# Patient Record
Sex: Female | Born: 1960
Health system: Southern US, Community
[De-identification: ages and names within clinical notes are randomized; demographics above are authoritative.]

## PROBLEM LIST (undated history)

## (undated) DIAGNOSIS — F1010 Alcohol abuse, uncomplicated: Secondary | ICD-10-CM

## (undated) DIAGNOSIS — F329 Major depressive disorder, single episode, unspecified: Secondary | ICD-10-CM

## (undated) DIAGNOSIS — C159 Malignant neoplasm of esophagus, unspecified: Secondary | ICD-10-CM

## (undated) DIAGNOSIS — I5021 Acute systolic (congestive) heart failure: Secondary | ICD-10-CM

## (undated) DIAGNOSIS — J189 Pneumonia, unspecified organism: Secondary | ICD-10-CM

## (undated) DIAGNOSIS — F319 Bipolar disorder, unspecified: Secondary | ICD-10-CM

## (undated) DIAGNOSIS — F32A Depression, unspecified: Secondary | ICD-10-CM

## (undated) DIAGNOSIS — I428 Other cardiomyopathies: Secondary | ICD-10-CM

## (undated) DIAGNOSIS — Z789 Other specified health status: Secondary | ICD-10-CM

## (undated) DIAGNOSIS — B1920 Unspecified viral hepatitis C without hepatic coma: Secondary | ICD-10-CM

## (undated) DIAGNOSIS — I10 Essential (primary) hypertension: Secondary | ICD-10-CM

## (undated) HISTORY — PX: LACERATION REPAIR: SHX5168

## (undated) HISTORY — PX: MULTIPLE TOOTH EXTRACTIONS: SHX2053

---

## 2002-01-16 ENCOUNTER — Encounter: Payer: Self-pay | Admitting: Emergency Medicine

## 2002-01-16 ENCOUNTER — Emergency Department (HOSPITAL_COMMUNITY): Admission: EM | Admit: 2002-01-16 | Discharge: 2002-01-16 | Payer: Self-pay | Admitting: Emergency Medicine

## 2002-03-23 ENCOUNTER — Emergency Department (HOSPITAL_COMMUNITY): Admission: EM | Admit: 2002-03-23 | Discharge: 2002-03-23 | Payer: Self-pay | Admitting: Emergency Medicine

## 2002-03-23 ENCOUNTER — Encounter: Payer: Self-pay | Admitting: Emergency Medicine

## 2007-06-14 ENCOUNTER — Emergency Department (HOSPITAL_COMMUNITY): Admission: EM | Admit: 2007-06-14 | Discharge: 2007-06-14 | Payer: Self-pay | Admitting: Emergency Medicine

## 2009-07-13 ENCOUNTER — Emergency Department (HOSPITAL_COMMUNITY): Admission: EM | Admit: 2009-07-13 | Discharge: 2009-07-13 | Payer: Self-pay | Admitting: Emergency Medicine

## 2009-12-10 ENCOUNTER — Emergency Department (HOSPITAL_COMMUNITY): Admission: EM | Admit: 2009-12-10 | Discharge: 2009-12-10 | Payer: Self-pay | Admitting: Emergency Medicine

## 2010-06-28 LAB — URINALYSIS, MICROSCOPIC ONLY
Bilirubin Urine: NEGATIVE
Glucose, UA: NEGATIVE mg/dL
Ketones, ur: NEGATIVE mg/dL
Nitrite: NEGATIVE
Protein, ur: NEGATIVE mg/dL
Specific Gravity, Urine: 1.002 — ABNORMAL LOW (ref 1.005–1.030)
Urobilinogen, UA: 0.2 mg/dL (ref 0.0–1.0)
pH: 6 (ref 5.0–8.0)

## 2010-06-28 LAB — POCT URINALYSIS DIP (DEVICE)
Bilirubin Urine: NEGATIVE
Glucose, UA: NEGATIVE mg/dL
Ketones, ur: NEGATIVE mg/dL
Nitrite: NEGATIVE
Protein, ur: NEGATIVE mg/dL
Specific Gravity, Urine: <=1.005 (ref 1.005–1.030)
Urobilinogen, UA: 0.2 mg/dL (ref 0.0–1.0)
pH: 6.5 (ref 5.0–8.0)

## 2010-06-28 LAB — URINE CULTURE: Colony Count: 25000

## 2010-08-08 ENCOUNTER — Emergency Department (HOSPITAL_COMMUNITY)
Admission: EM | Admit: 2010-08-08 | Discharge: 2010-08-09 | Disposition: A | Discharge status: Home / Self Care | Payer: Self-pay | Attending: Emergency Medicine | Admitting: Emergency Medicine

## 2010-08-08 ENCOUNTER — Emergency Department (HOSPITAL_COMMUNITY): Payer: Self-pay

## 2010-08-08 DIAGNOSIS — R05 Cough: Secondary | ICD-10-CM | POA: Insufficient documentation

## 2010-08-08 DIAGNOSIS — F329 Major depressive disorder, single episode, unspecified: Secondary | ICD-10-CM | POA: Insufficient documentation

## 2010-08-08 DIAGNOSIS — R252 Cramp and spasm: Secondary | ICD-10-CM | POA: Insufficient documentation

## 2010-08-08 DIAGNOSIS — I10 Essential (primary) hypertension: Secondary | ICD-10-CM | POA: Insufficient documentation

## 2010-08-08 DIAGNOSIS — R059 Cough, unspecified: Secondary | ICD-10-CM | POA: Insufficient documentation

## 2010-08-08 DIAGNOSIS — IMO0001 Reserved for inherently not codable concepts without codable children: Secondary | ICD-10-CM | POA: Insufficient documentation

## 2010-08-08 DIAGNOSIS — J189 Pneumonia, unspecified organism: Secondary | ICD-10-CM | POA: Insufficient documentation

## 2010-08-08 DIAGNOSIS — F3289 Other specified depressive episodes: Secondary | ICD-10-CM | POA: Insufficient documentation

## 2010-08-08 DIAGNOSIS — R071 Chest pain on breathing: Secondary | ICD-10-CM | POA: Insufficient documentation

## 2010-08-08 LAB — BASIC METABOLIC PANEL
GFR calc Af Amer: >60 mL/min (ref >60)
GFR calc non Af Amer: >60 mL/min (ref >60)
Potassium: 3 mEq/L — ABNORMAL LOW (ref 3.5–5.1)
Sodium: 143 mEq/L (ref 135–145)

## 2010-08-08 LAB — POCT CARDIAC MARKERS
CKMB, poc: <1 ng/mL — ABNORMAL LOW (ref 1.0–8.0)
Troponin i, poc: <0.05 ng/mL (ref 0.00–0.09)

## 2010-08-08 LAB — CBC
HCT: 33.8 % — ABNORMAL LOW (ref 36.0–46.0)
MCV: 70.9 fL — ABNORMAL LOW (ref 78.0–100.0)
RBC: 4.77 MIL/uL (ref 3.87–5.11)
WBC: 16.1 10*3/uL — ABNORMAL HIGH (ref 4.0–10.5)

## 2011-04-27 ENCOUNTER — Emergency Department (HOSPITAL_COMMUNITY): Payer: Self-pay

## 2011-04-27 ENCOUNTER — Encounter (HOSPITAL_COMMUNITY): Payer: Self-pay | Admitting: *Deleted

## 2011-04-27 ENCOUNTER — Emergency Department (HOSPITAL_COMMUNITY)
Admission: EM | Admit: 2011-04-27 | Discharge: 2011-04-28 | Disposition: A | Discharge status: Home / Self Care | Payer: Self-pay | Attending: Emergency Medicine | Admitting: Emergency Medicine

## 2011-04-27 DIAGNOSIS — S02609A Fracture of mandible, unspecified, initial encounter for closed fracture: Secondary | ICD-10-CM

## 2011-04-27 DIAGNOSIS — S0003XA Contusion of scalp, initial encounter: Secondary | ICD-10-CM | POA: Insufficient documentation

## 2011-04-27 DIAGNOSIS — M79609 Pain in unspecified limb: Secondary | ICD-10-CM | POA: Insufficient documentation

## 2011-04-27 DIAGNOSIS — I10 Essential (primary) hypertension: Secondary | ICD-10-CM | POA: Insufficient documentation

## 2011-04-27 DIAGNOSIS — R221 Localized swelling, mass and lump, neck: Secondary | ICD-10-CM | POA: Insufficient documentation

## 2011-04-27 DIAGNOSIS — R6884 Jaw pain: Secondary | ICD-10-CM | POA: Insufficient documentation

## 2011-04-27 DIAGNOSIS — S02600A Fracture of unspecified part of body of mandible, initial encounter for closed fracture: Secondary | ICD-10-CM | POA: Insufficient documentation

## 2011-04-27 DIAGNOSIS — Y92009 Unspecified place in unspecified non-institutional (private) residence as the place of occurrence of the external cause: Secondary | ICD-10-CM | POA: Insufficient documentation

## 2011-04-27 DIAGNOSIS — S1093XA Contusion of unspecified part of neck, initial encounter: Secondary | ICD-10-CM | POA: Insufficient documentation

## 2011-04-27 DIAGNOSIS — R220 Localized swelling, mass and lump, head: Secondary | ICD-10-CM | POA: Insufficient documentation

## 2011-04-27 HISTORY — DX: Essential (primary) hypertension: I10

## 2011-04-27 MED ORDER — ACETAMINOPHEN 325 MG PO TABS
650.0000 mg | ORAL_TABLET | Freq: Once | ORAL | Status: AC
  Administered 2011-04-27: 650 mg via ORAL
  Filled 2011-04-27: qty 2
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
## 2011-04-28 MED ORDER — IBUPROFEN 800 MG PO TABS
800.0000 mg | ORAL_TABLET | Freq: Once | ORAL | Status: AC
  Administered 2011-04-28: 800 mg via ORAL
  Filled 2011-04-28: qty 1

## 2011-04-28 MED ORDER — OXYCODONE-ACETAMINOPHEN 5-325 MG PO TABS: 1.0000 | ORAL_TABLET | Freq: Four times a day (QID) | ORAL | Status: AC | PRN

## 2011-04-28 NOTE — ED Notes (Signed)
rx x 1, pt voiced understanding to f/u with ENT. 

## 2011-04-28 NOTE — ED Provider Notes (Signed)
Medical screening examination/treatment/procedure(s) were performed by non-physician practitioner and as supervising physician I was immediately available for consultation/collaboration.   Dione Booze, MD 04/28/11 [phone]

## 2011-05-02 ENCOUNTER — Encounter (HOSPITAL_BASED_OUTPATIENT_CLINIC_OR_DEPARTMENT_OTHER): Payer: Self-pay | Admitting: *Deleted

## 2011-05-07 ENCOUNTER — Ambulatory Visit (HOSPITAL_BASED_OUTPATIENT_CLINIC_OR_DEPARTMENT_OTHER): Admission: RE | Admit: 2011-05-07 | Payer: Self-pay | Source: Ambulatory Visit | Admitting: Plastic Surgery

## 2011-05-07 ENCOUNTER — Encounter (HOSPITAL_BASED_OUTPATIENT_CLINIC_OR_DEPARTMENT_OTHER): Admission: RE | Payer: Self-pay | Source: Ambulatory Visit

## 2011-05-07 HISTORY — DX: Pneumonia, unspecified organism: J18.9

## 2011-05-07 HISTORY — DX: Other specified health status: Z78.9

## 2011-05-07 SURGERY — OPEN REDUCTION INTERNAL FIXATION (ORIF) MANDIBULAR FRACTURE
Anesthesia: General

## 2011-05-15 NOTE — Progress Notes (Signed)
surg was resch All preop instruction reviewed No food-drink am of surgery

## 2011-05-21 ENCOUNTER — Encounter (HOSPITAL_BASED_OUTPATIENT_CLINIC_OR_DEPARTMENT_OTHER): Payer: Self-pay | Admitting: Anesthesiology

## 2011-05-21 ENCOUNTER — Ambulatory Visit (HOSPITAL_BASED_OUTPATIENT_CLINIC_OR_DEPARTMENT_OTHER): Payer: Self-pay | Admitting: Anesthesiology

## 2011-05-21 ENCOUNTER — Ambulatory Visit (HOSPITAL_BASED_OUTPATIENT_CLINIC_OR_DEPARTMENT_OTHER)
Admission: RE | Admit: 2011-05-21 | Discharge: 2011-05-21 | Disposition: A | Discharge status: Home / Self Care | Payer: Self-pay | Source: Ambulatory Visit | Attending: Plastic Surgery | Admitting: Plastic Surgery

## 2011-05-21 ENCOUNTER — Encounter (HOSPITAL_BASED_OUTPATIENT_CLINIC_OR_DEPARTMENT_OTHER): Payer: Self-pay | Admitting: Plastic Surgery

## 2011-05-21 ENCOUNTER — Encounter (HOSPITAL_BASED_OUTPATIENT_CLINIC_OR_DEPARTMENT_OTHER)
Admission: RE | Disposition: A | Discharge status: Home / Self Care | Payer: Self-pay | Source: Ambulatory Visit | Attending: Plastic Surgery

## 2011-05-21 ENCOUNTER — Encounter (HOSPITAL_BASED_OUTPATIENT_CLINIC_OR_DEPARTMENT_OTHER): Payer: Self-pay | Admitting: *Deleted

## 2011-05-21 DIAGNOSIS — Y998 Other external cause status: Secondary | ICD-10-CM | POA: Insufficient documentation

## 2011-05-21 DIAGNOSIS — K08109 Complete loss of teeth, unspecified cause, unspecified class: Secondary | ICD-10-CM | POA: Insufficient documentation

## 2011-05-21 DIAGNOSIS — Y9389 Activity, other specified: Secondary | ICD-10-CM | POA: Insufficient documentation

## 2011-05-21 DIAGNOSIS — S02609A Fracture of mandible, unspecified, initial encounter for closed fracture: Secondary | ICD-10-CM | POA: Diagnosis present

## 2011-05-21 DIAGNOSIS — S02600A Fracture of unspecified part of body of mandible, initial encounter for closed fracture: Secondary | ICD-10-CM | POA: Insufficient documentation

## 2011-05-21 DIAGNOSIS — S02650A Fracture of angle of mandible, unspecified side, initial encounter for closed fracture: Secondary | ICD-10-CM | POA: Insufficient documentation

## 2011-05-21 DIAGNOSIS — Y92009 Unspecified place in unspecified non-institutional (private) residence as the place of occurrence of the external cause: Secondary | ICD-10-CM | POA: Insufficient documentation

## 2011-05-21 HISTORY — PX: ORIF MANDIBULAR FRACTURE: SHX2127

## 2011-05-21 SURGERY — OPEN REDUCTION INTERNAL FIXATION (ORIF) MANDIBULAR FRACTURE
Anesthesia: General | Site: Mouth | Laterality: Bilateral | Wound class: Clean Contaminated

## 2011-05-21 MED ORDER — METOCLOPRAMIDE HCL 5 MG/ML IJ SOLN: 10.0000 mg | Freq: Once | INTRAMUSCULAR | Status: DC | PRN

## 2011-05-21 MED ORDER — CEPHALEXIN 500 MG PO CAPS: 500.0000 mg | ORAL_CAPSULE | Freq: Four times a day (QID) | ORAL | Status: AC

## 2011-05-21 MED ORDER — ACETAMINOPHEN 10 MG/ML IV SOLN
1000.0000 mg | Freq: Once | INTRAVENOUS | Status: AC
  Administered 2011-05-21: 1000 mg via INTRAVENOUS

## 2011-05-21 MED ORDER — MORPHINE SULFATE 2 MG/ML IJ SOLN: 0.0500 mg/kg | INTRAMUSCULAR | Status: DC | PRN

## 2011-05-21 MED ORDER — FENTANYL CITRATE 0.05 MG/ML IJ SOLN
INTRAMUSCULAR | Status: DC | PRN
  Administered 2011-05-21 (×4): 25 ug via INTRAVENOUS
  Administered 2011-05-21 (×2): 50 ug via INTRAVENOUS
  Administered 2011-05-21 (×3): 25 ug via INTRAVENOUS
  Administered 2011-05-21 (×2): 50 ug via INTRAVENOUS
  Administered 2011-05-21: 25 ug via INTRAVENOUS

## 2011-05-21 MED ORDER — LIDOCAINE-EPINEPHRINE 1 %-1:100000 IJ SOLN
INTRAMUSCULAR | Status: DC | PRN
  Administered 2011-05-21: 4 mL

## 2011-05-21 MED ORDER — DROPERIDOL 2.5 MG/ML IJ SOLN
INTRAMUSCULAR | Status: DC | PRN
  Administered 2011-05-21: 0.625 mg via INTRAVENOUS

## 2011-05-21 MED ORDER — DEXAMETHASONE SODIUM PHOSPHATE 4 MG/ML IJ SOLN
INTRAMUSCULAR | Status: DC | PRN
  Administered 2011-05-21: 10 mg via INTRAVENOUS

## 2011-05-21 MED ORDER — ONDANSETRON HCL 4 MG/2ML IJ SOLN
INTRAMUSCULAR | Status: DC | PRN
  Administered 2011-05-21: 4 mg via INTRAVENOUS

## 2011-05-21 MED ORDER — FENTANYL CITRATE 0.05 MG/ML IJ SOLN: 25.0000 ug | INTRAMUSCULAR | Status: DC | PRN

## 2011-05-21 MED ORDER — CEFAZOLIN SODIUM 1-5 GM-% IV SOLN
INTRAVENOUS | Status: DC | PRN
  Administered 2011-05-21: 1 g via INTRAVENOUS

## 2011-05-21 MED ORDER — MIDAZOLAM HCL 2 MG/2ML IJ SOLN: 0.5000 mg | INTRAMUSCULAR | Status: DC | PRN

## 2011-05-21 MED ORDER — LABETALOL HCL 5 MG/ML IV SOLN
INTRAVENOUS | Status: DC | PRN
  Administered 2011-05-21 (×4): 5 mg via INTRAVENOUS

## 2011-05-21 MED ORDER — MIDAZOLAM HCL 5 MG/5ML IJ SOLN
INTRAMUSCULAR | Status: DC | PRN
  Administered 2011-05-21: 2 mg via INTRAVENOUS

## 2011-05-21 MED ORDER — SUCCINYLCHOLINE CHLORIDE 20 MG/ML IJ SOLN
INTRAMUSCULAR | Status: DC | PRN
  Administered 2011-05-21: 100 mg via INTRAVENOUS

## 2011-05-21 MED ORDER — LACTATED RINGERS IV SOLN
INTRAVENOUS | Status: DC
  Administered 2011-05-21 (×4): via INTRAVENOUS

## 2011-05-21 MED ORDER — PROPOFOL 10 MG/ML IV EMUL
INTRAVENOUS | Status: DC | PRN
  Administered 2011-05-21: 200 mg via INTRAVENOUS

## 2011-05-21 MED ORDER — BUPIVACAINE-EPINEPHRINE 0.25% -1:200000 IJ SOLN
INTRAMUSCULAR | Status: DC | PRN
  Administered 2011-05-21: 2 mL

## 2011-05-21 MED ORDER — LIDOCAINE HCL (CARDIAC) 20 MG/ML IV SOLN
INTRAVENOUS | Status: DC | PRN
  Administered 2011-05-21: 40 mg via INTRAVENOUS

## 2011-05-21 SURGICAL SUPPLY — 56 items
BIT DRILL 1.6X115 (BIT) ×1
BIT DRILL 1.6X115MM (BIT) ×1 IMPLANT
CANISTER SUCTION 1200CC (MISCELLANEOUS) ×2 IMPLANT
CLEANER CAUTERY TIP 5X5 PAD (MISCELLANEOUS) IMPLANT
CLOTH BEACON ORANGE TIMEOUT ST (SAFETY) ×2 IMPLANT
CONFORMERS SILICONE 5649 (OPHTHALMIC RELATED) IMPLANT
DECANTER SPIKE VIAL GLASS SM (MISCELLANEOUS) ×2 IMPLANT
DEPRESSOR TONGUE BLADE STERILE (MISCELLANEOUS) IMPLANT
DRILL BIT 1.6X115MM (BIT) ×1
ELECT COATED BLADE 2.86 ST (ELECTRODE) ×2 IMPLANT
ELECT NEEDLE BLADE 2-5/6 (NEEDLE) ×2 IMPLANT
ELECT REM PT RETURN 9FT ADLT (ELECTROSURGICAL) ×2
ELECTRODE REM PT RTRN 9FT ADLT (ELECTROSURGICAL) ×1 IMPLANT
GAUZE PACKING FOLDED 2  STR (GAUZE/BANDAGES/DRESSINGS) ×1
GAUZE PACKING FOLDED 2 STR (GAUZE/BANDAGES/DRESSINGS) ×1 IMPLANT
GAUZE VASELINE FOILPK 1/2 X 72 (GAUZE/BANDAGES/DRESSINGS) IMPLANT
GLOVE BIO SURGEON STRL SZ 6.5 (GLOVE) ×4 IMPLANT
GLOVE BIO SURGEON STRL SZ7 (GLOVE) ×2 IMPLANT
GLOVE BIOGEL PI IND STRL 7.0 (GLOVE) ×2 IMPLANT
GLOVE BIOGEL PI INDICATOR 7.0 (GLOVE) ×2
GLOVE SKINSENSE NS SZ7.0 (GLOVE) ×1
GLOVE SKINSENSE STRL SZ7.0 (GLOVE) ×1 IMPLANT
GOWN PREVENTION PLUS XLARGE (GOWN DISPOSABLE) ×6 IMPLANT
NEEDLE 27GAX1X1/2 (NEEDLE) ×2 IMPLANT
NEEDLE BLUNT 17GA (NEEDLE) ×2 IMPLANT
NS IRRIG 1000ML POUR BTL (IV SOLUTION) ×2 IMPLANT
PACK BASIN DAY SURGERY FS (CUSTOM PROCEDURE TRAY) ×2 IMPLANT
PACK ENT DAY SURGERY (CUSTOM PROCEDURE TRAY) ×2 IMPLANT
PAD CLEANER CAUTERY TIP 5X5 (MISCELLANEOUS)
PATTIES SURGICAL .5 X3 (DISPOSABLE) IMPLANT
PENCIL BUTTON HOLSTER BLD 10FT (ELECTRODE) ×2 IMPLANT
PLATE LOCK 4H W/GAP 1.0 GRD 3 (Plate) ×2 IMPLANT
PLATE LOCK STRT 2 X 7 MATRIX (Plate) ×2 IMPLANT
SCISSORS WIRE ANG 4 3/4 DISP (INSTRUMENTS) IMPLANT
SCREW NON LOCK X-DR 2.0X5 (Screw) ×2 IMPLANT
SCREW NON LOCK X-DR 2.0X6 (Screw) ×8 IMPLANT
SCREW NON LOCK X-DR 2.0X8 (Screw) ×10 IMPLANT
SCREW NON LOCK X-DR 2.3X8 (Screw) ×2 IMPLANT
SLEEVE SCD COMPRESS KNEE MED (MISCELLANEOUS) ×2 IMPLANT
SPONGE GAUZE 2X2 8PLY STRL LF (GAUZE/BANDAGES/DRESSINGS) ×2 IMPLANT
SUCTION FRAZIER TIP 10 FR DISP (SUCTIONS) ×2 IMPLANT
SUT PROLENE 5 0 PS 2 (SUTURE) ×2 IMPLANT
SUT STEEL 0 (SUTURE)
SUT STEEL 0 18XMFL TIE 17 (SUTURE) IMPLANT
SUT STEEL 1 (SUTURE) IMPLANT
SUT STEEL 2 (SUTURE) IMPLANT
SUT VIC AB 3-0 FS2 27 (SUTURE) ×6 IMPLANT
SUT VICRYL 4-0 PS2 18IN ABS (SUTURE) ×2 IMPLANT
SYR BULB 3OZ (MISCELLANEOUS) IMPLANT
SYR CONTROL 10ML LL (SYRINGE) ×2 IMPLANT
TOOTHBRUSH ADULT (PERSONAL CARE ITEMS) ×2 IMPLANT
TOWEL OR 17X24 6PK STRL BLUE (TOWEL DISPOSABLE) ×4 IMPLANT
TRAY DSU PREP LF (CUSTOM PROCEDURE TRAY) ×2 IMPLANT
TUBE SALEM SUMP 16 FR W/ARV (TUBING) ×2 IMPLANT
WATER STERILE IRR 1000ML POUR (IV SOLUTION) IMPLANT
YANKAUER SUCT BULB TIP NO VENT (SUCTIONS) ×2 IMPLANT

## 2011-05-21 NOTE — Anesthesia Preprocedure Evaluation (Signed)
Anesthesia Evaluation  Patient identified by MRN, date of birth, ID band Patient awake    Reviewed: Allergy & Precautions, H&P , NPO status , Patient's Chart, lab work & pertinent test results, reviewed documented beta blocker date and time   Airway Mallampati: II TM Distance: >3 FB Neck ROM: full    Dental   Pulmonary pneumonia ,          Cardiovascular hypertension, On Medications     Neuro/Psych Negative Neurological ROS  Negative Psych ROS   GI/Hepatic negative GI ROS, Neg liver ROS,   Endo/Other  Negative Endocrine ROS  Renal/GU negative Renal ROS  Genitourinary negative   Musculoskeletal   Abdominal   Peds  Hematology negative hematology ROS (+)   Anesthesia Other Findings See surgeon's H&P   Reproductive/Obstetrics negative OB ROS                           Anesthesia Physical Anesthesia Plan  ASA: II  Anesthesia Plan: General   Post-op Pain Management:    Induction: Intravenous  Airway Management Planned: Nasal ETT  Additional Equipment:   Intra-op Plan:   Post-operative Plan: Extubation in OR  Informed Consent: I have reviewed the patients History and Physical, chart, labs and discussed the procedure including the risks, benefits and alternatives for the proposed anesthesia with the patient or authorized representative who has indicated his/her understanding and acceptance.     Plan Discussed with: CRNA and Surgeon  Anesthesia Plan Comments:         Anesthesia Quick Evaluation

## 2011-05-21 NOTE — Transfer of Care (Signed)
Immediate Anesthesia Transfer of Care Note  Patient: Alexandria Buck  Procedure(s) Performed:  OPEN REDUCTION INTERNAL FIXATION (ORIF) MANDIBULAR FRACTURE  Patient Location: PACU  Anesthesia Type: General  Level of Consciousness: awake and sedated  Airway & Oxygen Therapy: Patient Spontanous Breathing and Patient connected to face mask oxygen  Post-op Assessment: Report given to PACU RN and Post -op Vital signs reviewed and stable  Post vital signs: Reviewed and stable  Complications: No apparent anesthesia complications
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
## 2011-05-21 NOTE — Anesthesia Procedure Notes (Signed)
Procedure Name: Intubation Date/Time: 05/21/2011 12:41 PM Performed by: Jearld Shines Pre-anesthesia Checklist: Patient identified, Emergency Drugs available, Suction available and Patient being monitored Patient Re-evaluated:Patient Re-evaluated prior to inductionOxygen Delivery Method: Circle System Utilized Preoxygenation: Pre-oxygenation with 100% oxygen Intubation Type: IV induction Ventilation: Mask ventilation without difficulty Laryngoscope Size: Miller, 2, Mac and 3 Grade View: Grade II Nasal Tubes: Right, Magill forceps - small, utilized and Nasal prep performed Tube size: 6.5 mm Number of attempts: 4 Airway Equipment and Method: video-laryngoscopy Placement Confirmation: ETT inserted through vocal cords under direct vision,  positive ETCO2 and breath sounds checked- equal and bilateral Tube secured with: Tape Dental Injury: Teeth and Oropharynx as per pre-operative assessment and Bloody posterior oropharynx  Difficulty Due To: Difficult Airway- due to large tongue, Difficulty was anticipated and Difficult Airway- due to anterior larynx Future Recommendations: Recommend- induction with short-acting agent, and alternative techniques readily available

## 2011-05-21 NOTE — Anesthesia Postprocedure Evaluation (Signed)
Anesthesia Post Note  Patient: Alexandria Buck  Procedure(s) Performed: Procedure(s) (LRB): OPEN REDUCTION INTERNAL FIXATION (ORIF) MANDIBULAR FRACTURE (Bilateral)  Anesthesia type: General  Patient location: PACU  Post pain: Pain level controlled  Post assessment: Patient's Cardiovascular Status Stable  Last Vitals:  Filed Vitals:   05/21/11 1600  BP: 169/92  Pulse: 62  Temp:   Resp:     Post vital signs: Reviewed and stable  Level of consciousness: alert  Complications: No apparent anesthesia complications

## 2011-05-21 NOTE — Brief Op Note (Signed)
05/21/2011  3:19 PM  PATIENT:  Alexandria Buck  51 y.o. female  PRE-OPERATIVE DIAGNOSIS:  bilateral mandible fracture  POST-OPERATIVE DIAGNOSIS:  bilateral mandible fracture  PROCEDURE:  Procedure(s): OPEN REDUCTION INTERNAL FIXATION (ORIF) MANDIBULAR FRACTURE  SURGEON:  Surgeon(s): [employer], DO  PHYSICIAN ASSISTANT:   ASSISTANTS: none   ANESTHESIA:   local and general  EBL:  Total I/O In: 2000 [I.V.:2000] Out: -   BLOOD ADMINISTERED:none  DRAINS: none   LOCAL MEDICATIONS USED:  MARCAINE     SPECIMEN:  No Specimen  DISPOSITION OF SPECIMEN:  N/A  COUNTS:  YES  TOURNIQUET:  * No tourniquets in log *  DICTATION: dictated  PLAN OF CARE: Discharge to home after PACU  PATIENT DISPOSITION:  PACU - hemodynamically stable.   Delay start of Pharmacological VTE agent (>24hrs) due to surgical blood loss or risk of bleeding: no
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
## 2011-05-28 ENCOUNTER — Encounter (HOSPITAL_BASED_OUTPATIENT_CLINIC_OR_DEPARTMENT_OTHER): Payer: Self-pay | Admitting: Plastic Surgery

## 2013-09-17 ENCOUNTER — Encounter (HOSPITAL_COMMUNITY): Payer: Self-pay | Admitting: Emergency Medicine

## 2013-09-17 ENCOUNTER — Emergency Department (HOSPITAL_COMMUNITY)
Admission: EM | Admit: 2013-09-17 | Discharge: 2013-09-17 | Discharge status: Against Medical Advice | Payer: Self-pay | Attending: Emergency Medicine | Admitting: Emergency Medicine

## 2013-09-17 ENCOUNTER — Emergency Department (HOSPITAL_COMMUNITY): Payer: Self-pay

## 2013-09-17 DIAGNOSIS — I10 Essential (primary) hypertension: Secondary | ICD-10-CM | POA: Insufficient documentation

## 2013-09-17 DIAGNOSIS — R000 Tachycardia, unspecified: Secondary | ICD-10-CM | POA: Insufficient documentation

## 2013-09-17 DIAGNOSIS — R059 Cough, unspecified: Secondary | ICD-10-CM | POA: Insufficient documentation

## 2013-09-17 DIAGNOSIS — R05 Cough: Secondary | ICD-10-CM | POA: Insufficient documentation

## 2013-09-17 DIAGNOSIS — F17200 Nicotine dependence, unspecified, uncomplicated: Secondary | ICD-10-CM | POA: Insufficient documentation

## 2013-09-17 DIAGNOSIS — D696 Thrombocytopenia, unspecified: Secondary | ICD-10-CM | POA: Insufficient documentation

## 2013-09-17 DIAGNOSIS — R079 Chest pain, unspecified: Secondary | ICD-10-CM | POA: Insufficient documentation

## 2013-09-17 DIAGNOSIS — Z8701 Personal history of pneumonia (recurrent): Secondary | ICD-10-CM | POA: Insufficient documentation

## 2013-09-17 DIAGNOSIS — R109 Unspecified abdominal pain: Secondary | ICD-10-CM | POA: Insufficient documentation

## 2013-09-17 DIAGNOSIS — F319 Bipolar disorder, unspecified: Secondary | ICD-10-CM | POA: Insufficient documentation

## 2013-09-17 HISTORY — DX: Major depressive disorder, single episode, unspecified: F32.9

## 2013-09-17 HISTORY — DX: Depression, unspecified: F32.A

## 2013-09-17 HISTORY — DX: Bipolar disorder, unspecified: F31.9

## 2013-09-17 LAB — TROPONIN I: Troponin I: <0.3 ng/mL (ref <0.30)

## 2013-09-17 LAB — LIPASE, BLOOD: Lipase: 16 U/L (ref 11–59)

## 2013-09-17 LAB — CBC
HEMATOCRIT: 41.3 % (ref 36.0–46.0)
Hemoglobin: 14.5 g/dL (ref 12.0–15.0)
MCH: 32.5 pg (ref 26.0–34.0)
MCHC: 35.1 g/dL (ref 30.0–36.0)
MCV: 92.6 fL (ref 78.0–100.0)
Platelets: 138 10*3/uL — ABNORMAL LOW (ref 150–400)
RBC: 4.46 MIL/uL (ref 3.87–5.11)
RDW: 12.4 % (ref 11.5–15.5)
WBC: 10.8 10*3/uL — ABNORMAL HIGH (ref 4.0–10.5)

## 2013-09-17 LAB — BASIC METABOLIC PANEL
BUN: 3 mg/dL — AB (ref 6–23)
CO2: 22 mEq/L (ref 19–32)
CREATININE: 0.56 mg/dL (ref 0.50–1.10)
Calcium: 9.4 mg/dL (ref 8.4–10.5)
Chloride: 97 mEq/L (ref 96–112)
Glucose, Bld: 100 mg/dL — ABNORMAL HIGH (ref 70–99)
POTASSIUM: 3.6 meq/L — AB (ref 3.7–5.3)
Sodium: 137 mEq/L (ref 137–147)

## 2013-09-17 MED ORDER — KETOROLAC TROMETHAMINE 30 MG/ML IJ SOLN: 30.0000 mg | Freq: Once | INTRAMUSCULAR | Status: DC

## 2013-09-17 MED ORDER — POTASSIUM CHLORIDE CRYS ER 20 MEQ PO TBCR: 40.0000 meq | EXTENDED_RELEASE_TABLET | Freq: Once | ORAL | Status: DC
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
## 2013-09-17 NOTE — ED Notes (Signed)
Pt has decided to leave AMA. Dr. Roxanne Mins notified.

## 2013-09-17 NOTE — Discharge Instructions (Signed)
He was leaving [place]. Your evaluation was going to include test to see if you have blood clots in your lungs. Clots in your lungs can be fatal. If you change your mind, please return immediately and we will continue with the evaluation.  Acknowledgement of Risk of Discharge  Against Medical Advice   And Release of Liability    Because I am choosing to leave the hospital in spite of these risks, I release the hospital, its employees and officers, and my attending physician from all liability for any adverse results caused by my leaving the hospital prematurely.   ________________________________      _____________________________________ Patient's Signature                                        Date and Time   ________________________________      _____________________________________ Witness' Signature                                        Relationship to Patient    ________________________________      _____________________________________ Witness' Signature                                        Relationship to Patient   [If patient refuses to sign, write "Patient refused to sign" on the patient's signature line and have witnesses to the refusal sign as witnesses.]    Chest Pain (Nonspecific) It is often hard to give a specific diagnosis for the cause of chest pain. There is always a chance that your pain could be related to something serious, such as a heart attack or a blood clot in the lungs. You need to follow up with your caregiver for further evaluation. CAUSES   Heartburn.  Pneumonia or bronchitis.  Anxiety or stress.  Inflammation around your heart (pericarditis) or lung (pleuritis or pleurisy).  A blood clot in the lung.  A collapsed lung (pneumothorax). It can develop suddenly on its own (spontaneous pneumothorax) or from injury (trauma) to the chest.  Shingles infection (herpes zoster virus). The chest wall is composed of bones, muscles,  and cartilage. Any of these can be the source of the pain.  The bones can be bruised by injury.  The muscles or cartilage can be strained by coughing or overwork.  The cartilage can be affected by inflammation and become sore (costochondritis). DIAGNOSIS  Lab tests or other studies, such as X-rays, electrocardiography, stress testing, or cardiac imaging, may be needed to find the cause of your pain.  TREATMENT   Treatment depends on what may be causing your chest pain. Treatment may include:  Acid blockers for heartburn.  Anti-inflammatory medicine.  Pain medicine for inflammatory conditions.  Antibiotics if an infection is present.  You may be advised to change lifestyle habits. This includes stopping smoking and avoiding alcohol, caffeine, and chocolate.  You may be advised to keep your head raised (elevated) when sleeping. This reduces the chance of acid going backward from your stomach into your esophagus.  Most of the time, nonspecific chest pain will improve within 2 to 3 days with rest and mild pain medicine. HOME CARE INSTRUCTIONS   If antibiotics were prescribed, take your antibiotics  as directed. Finish them even if you start to feel better.  For the next few days, avoid physical activities that bring on chest pain. Continue physical activities as directed.  Do not smoke.  Avoid drinking alcohol.  Only take over-the-counter or prescription medicine for pain, discomfort, or fever as directed by your caregiver.  Follow your caregiver's suggestions for further testing if your chest pain does not go away.  Keep any follow-up appointments you made. If you do not go to an appointment, you could develop lasting (chronic) problems with pain. If there is any problem keeping an appointment, you must call to reschedule. SEEK MEDICAL CARE IF:   You think you are having problems from the medicine you are taking. Read your medicine instructions carefully.  Your chest pain  does not go away, even after treatment.  You develop a rash with blisters on your chest. SEEK IMMEDIATE MEDICAL CARE IF:   You have increased chest pain or pain that spreads to your arm, neck, jaw, back, or abdomen.  You develop shortness of breath, an increasing cough, or you are coughing up blood.  You have severe back or abdominal pain, feel nauseous, or vomit.  You develop severe weakness, fainting, or chills.  You have a fever. THIS IS AN EMERGENCY. Do not wait to see if the pain will go away. Get medical help at once. Call your local emergency services (911 in U.S.). Do not drive yourself to the hospital. MAKE SURE YOU:   Understand these instructions.  Will watch your condition.  Will get help right away if you are not doing well or get worse. Document Released: 01/03/2005 Document Revised: 06/18/2011 Document Reviewed: 10/30/2007 [facility] Patient Information 2014 [place].   Emergency Department Resource Guide 1) Find a Doctor and Pay Out of Pocket Although you won't have to find out who is covered by your insurance plan, it is a good idea to ask around and get recommendations. You will then need to call the office and see if the doctor you have chosen will accept you as a new patient and what types of options they offer for patients who are self-pay. Some doctors offer discounts or will set up payment plans for their patients who do not have insurance, but you will need to ask so you aren't surprised when you get to your appointment.  2) Contact Your Local Health Department Not all health departments have doctors that can see patients for sick visits, but many do, so it is worth a call to see if yours does. If you don't know where your local health department is, you can check in your phone book. The CDC also has a tool to help you locate your state's health department, and many state websites also have listings of all of their local health departments.  3) Find a  [facility] If your illness is not likely to be very severe or complicated, you may want to try a walk in clinic. These are popping up all over the country in pharmacies, drugstores, and shopping centers. They're usually staffed by nurse practitioners or physician assistants that have been trained to treat common illnesses and complaints. They're usually fairly quick and inexpensive. However, if you have serious medical issues or chronic medical problems, these are probably not your best option.  No Primary Care Doctor: - Call Health Connect at  [phone] - they can help you locate a primary care doctor that  accepts your insurance, provides certain services, etc. - Physician  Referral Service- [phone]  Chronic Pain Problems: Organization         Address  Phone   Notes  [facility]  [phone] Patients need to be referred by their primary care doctor.   Medication Assistance: Organization         Address  Phone   Notes  [facility] [address] [place], [place] [address] [phone] --Must be a resident of [facility] -- Must have NO insurance coverage whatsoever (no Medicaid/ Medicare, etc.) -- The pt. MUST have a primary care doctor that directs their care regularly and follows them in the community   [facility]ist  [phone]   [employer]  [phone]    Agencies that provide inexpensive medical care: Organization         Address  Phone   Notes  [place]  [phone]   [facility]    [phone]   [facility], [place] [address] [phone]   [place] [address], [place] [phone]   Planned Parenthood    [phone]   [facility]    [phone]   [place] and [place] [address], [employer] Phone:  [phone], Fax:  [phone] Hours of Operation:  9 am - 6 pm, M-F.  Also accepts Medicaid/Medicare and self-pay.  [facility] for [address], [address], [place] Phone: [phone], Fax: [phone]. Hours of Operation:  8:30 am - 5:30 pm, M-F.  Also accepts Medicaid and self-pay.  [facility] [place] [address], [place] Phone: [phone]   [place], [place], [place] [phone], Ext. [phone] Mondays & Thursdays: 7-9 AM.  First 15 patients are seen on a first come, first serve basis.    [place] Providers:  Organization         Address  Phone   Notes  [facility] [address], [place] [phone] Also accepts self-pay patients.  [facility] [address], [place]  [phone]   [place], [address], [place] [phone]   [facility] [address] [place] [phone]   Lucianne Lei [address], [place]   [phone] Only accepts [place] Access [place] patients after they have their name applied to their card.   Self-Pay (no insurance) in [facility]:  Organization         Address  Phone   Notes  Sickle Cell Patients, [facility] [employer] [phone]   [facility] [place] [phone]   [facility] [place]  [place], [place], [place] [phone]   [facility]/Dr. Osei-Bonsu  [address], [place] or [address], [place] [phone] Phone number for both [place] and [place] locations is the same.  [facility] and [facility] [address] [place] [phone]   [facility] [address] [place] or [address] [phone]     [facility] [place], [place] [phone],  phone; [phone], fax Sees patients 1st and 3rd Saturday of every month.  Must not qualify for public or private insurance (i.e. Medicaid, Medicare, [place] Health Choice, Veterans' Benefits)  Household income should be no more than 200% of the poverty level The clinic cannot treat you if you are pregnant or think you are pregnant  Sexually transmitted diseases are not treated at the clinic.    Dental Care: Organization         Address  Phone  Notes  [facility] Department of [facility] [place] [phone] Accepts children up to age 70 who are enrolled in [place] or [place]; pregnant women with a Medicaid card; and children who have applied for Medicaid or [place] Health Choice, but were declined, whose parents can pay a reduced fee at time of service.  [facility] Department of [facility]  [address], [place] [phone] Accepts children up to age 38 who are enrolled in [place] or [place]; pregnant women with a Medicaid card; and children who have applied for Medicaid or [place] Health Choice, but were declined, whose parents can pay a reduced fee at time of service.  [place] Adult [facility] Access PROGRAM  [place] [phone] Patients are seen by appointment only. Walk-ins are not accepted. [place] will see patients 53 years of age and older. Monday - Tuesday (8am-5pm) Most Wednesdays (8:30-5pm) $30 per visit, cash only  [facility] Adult Dental Access PROGRAM  [address], [facility] [phone] Patients are seen by appointment only. Walk-ins are not accepted. [place] will see patients 26 years of age and older. One Wednesday Evening (Monthly: Volunteer Based).  $30 per visit, cash only  [place]  [phone] for adults; Children under age 31, call  Graduate Pediatric Dentistry at [phone]. Children aged 73-14, please call [phone] to request a pediatric application.  Dental services are provided in all areas of dental care including fillings, crowns and bridges, complete and partial dentures, implants, gum treatment, root canals, and extractions. Preventive care is also provided. Treatment is provided to both adults and children. Patients are selected via a lottery and there is often a waiting list.   [facility] [address] [place]  [phone] [email or web address]   [facility] [address] [place], [place] [phone], Ext. [phone] Second and Fourth Thursday of each month, opens at 6:30 AM; Clinic ends at 9 AM.  Patients are seen on a first-come first-served basis, and a limited number are seen during each clinic.   [facility]  [address] Hillard Danker [place], [place] [phone]   Eligibility Requirements You must have lived in [place], [place], or [place] counties for at least the last three months.   You cannot be eligible for state or federal sponsored [employer], including [employer], [place], or [employer].   You generally cannot be eligible for healthcare insurance through your employer.    How to apply: Eligibility screenings are held every Tuesday and Wednesday afternoon from 1:00 pm until 4:00 pm. You do not need an appointment for the interview!  [facility] [address] [place], [place]   [place]  [place] Department  [place]  [phone]    Behavioral Health Resources in the  Community: Intensive Outpatient Dispensing optician         Address  Phone  Notes  [place]. [address], [place], [place] [phone]   [facility] [address] [place], [place]   ADS: Alcohol & Drug Svcs [address], [place], [place]   [place] [address],  [place], [place] or [phone]   Substance Abuse Resources Organization         Address  Phone  Notes  Alcohol and Drug Services  [phone]   [place]  [phone]   [facility] [place]   Chinita Pester  [phone]   Residential & Outpatient Substance Abuse Program  [phone]   Psychological Services Organization         Address  Phone  Notes  [facility] [place]  [place]  [phone]   [place] [address] [place] or [phone]    Mobile Crisis Teams Organization         Address  Phone  Notes  [facility], Mobile Crisis Care Unit  [phone]   [facility]  [address] [place], [place]   [place] Levels [address], [place] [employer] [employer] [phone]    Self-Help/Support Groups Organization         Address  Phone             Notes  [place]. of [place] - variety of support groups  Alice Call for more information  Narcotics Anonymous (NA), [facility] [address], [employer] [place]  2 meetings at this location   Special educational needs teacher         Address  Phone  Notes  [facility]idential [facility] [place],    [place]  [phone]   [facility]  [address] [place] [phone], [place], [place]   [place], [place] [phone] Admissions: 8am-3pm M-F  Incentives Substance [place] [address]    [place], [place] [phone]   [facility] [address] [place], [place], [place]   [facility] [address]  [place], [place]   [facility] Programs - Intensive Outpatient [address] Kristeen [address], [place], [place]   [facility] ([place].,  [place], [place] [phone] or [phone]   [facility] Services (RTS) [address], [facility], [place] Accepts Medicaid  [facility] [place] [address]  [place] [phone] Substance Abuse/Addiction Treatment   [facility] Organization         Address  Phone  Notes  [facility]  [phone]   Domenic Schwab, PhD [address] Arlis Porta [place], [place]   [phone] or [phone]   [place], [place] [phone]   [place] [address] [place], [place] [phone] Insurance/Medicaid/sponsorship through [employer] and Families [address] [place]                                    [place], [place] [phone] [place] [address], [place] [phone]  Dr. Adele Schilder  [phone]   [facility] Dept. 1) [address], [place] 2) [place] 3)  [place] [address], [place] [phone]  [phone]   [place] [phone] or [phone] (After Hours)

## 2013-09-17 NOTE — ED Notes (Addendum)
PT c/o R sided chest pain and cough ever since she was released from prison.  Pt states she tested negative for TB when she was discharged from prison.  She came to ED today b/c she cannot take pain anymore.  Pt very tender on palpation of abdomen.

## 2013-09-21 ENCOUNTER — Emergency Department (HOSPITAL_COMMUNITY): Payer: Self-pay

## 2013-09-21 ENCOUNTER — Emergency Department (HOSPITAL_COMMUNITY)
Admission: EM | Admit: 2013-09-21 | Discharge: 2013-09-21 | Disposition: A | Discharge status: Home / Self Care | Payer: Self-pay | Attending: Emergency Medicine | Admitting: Emergency Medicine

## 2013-09-21 ENCOUNTER — Encounter (HOSPITAL_COMMUNITY): Payer: Self-pay | Admitting: Emergency Medicine

## 2013-09-21 DIAGNOSIS — J159 Unspecified bacterial pneumonia: Secondary | ICD-10-CM | POA: Insufficient documentation

## 2013-09-21 DIAGNOSIS — J189 Pneumonia, unspecified organism: Secondary | ICD-10-CM

## 2013-09-21 DIAGNOSIS — Z79899 Other long term (current) drug therapy: Secondary | ICD-10-CM | POA: Insufficient documentation

## 2013-09-21 DIAGNOSIS — F3289 Other specified depressive episodes: Secondary | ICD-10-CM | POA: Insufficient documentation

## 2013-09-21 DIAGNOSIS — I10 Essential (primary) hypertension: Secondary | ICD-10-CM | POA: Insufficient documentation

## 2013-09-21 DIAGNOSIS — F329 Major depressive disorder, single episode, unspecified: Secondary | ICD-10-CM | POA: Insufficient documentation

## 2013-09-21 DIAGNOSIS — F17200 Nicotine dependence, unspecified, uncomplicated: Secondary | ICD-10-CM | POA: Insufficient documentation

## 2013-09-21 LAB — CBC
HCT: 40.2 % (ref 36.0–46.0)
Hemoglobin: 14.3 g/dL (ref 12.0–15.0)
MCH: 32.6 pg (ref 26.0–34.0)
MCHC: 35.6 g/dL (ref 30.0–36.0)
MCV: 91.8 fL (ref 78.0–100.0)
PLATELETS: 144 10*3/uL — AB (ref 150–400)
RBC: 4.38 MIL/uL (ref 3.87–5.11)
RDW: 11.9 % (ref 11.5–15.5)
WBC: 4.6 10*3/uL (ref 4.0–10.5)

## 2013-09-21 LAB — I-STAT TROPONIN, ED: TROPONIN I, POC: 0 ng/mL (ref 0.00–0.08)

## 2013-09-21 LAB — BASIC METABOLIC PANEL
BUN: 4 mg/dL — ABNORMAL LOW (ref 6–23)
CALCIUM: 9.5 mg/dL (ref 8.4–10.5)
CO2: 24 mEq/L (ref 19–32)
CREATININE: 0.49 mg/dL — AB (ref 0.50–1.10)
Chloride: 99 mEq/L (ref 96–112)
GFR calc non Af Amer: >90 mL/min (ref >90)
Glucose, Bld: 78 mg/dL (ref 70–99)
Potassium: 3.2 mEq/L — ABNORMAL LOW (ref 3.7–5.3)
Sodium: 138 mEq/L (ref 137–147)

## 2013-09-21 LAB — D-DIMER, QUANTITATIVE: D-Dimer, Quant: 0.55 ug/mL-FEU — ABNORMAL HIGH (ref 0.00–0.48)

## 2013-09-21 MED ORDER — LEVOFLOXACIN 500 MG PO TABS: 500.0000 mg | ORAL_TABLET | Freq: Every day | ORAL | Status: AC

## 2013-09-21 MED ORDER — LEVOFLOXACIN 500 MG PO TABS
500.0000 mg | ORAL_TABLET | Freq: Once | ORAL | Status: AC
  Administered 2013-09-21: 500 mg via ORAL
  Filled 2013-09-21: qty 1

## 2013-09-21 MED ORDER — PREDNISONE 20 MG PO TABS
60.0000 mg | ORAL_TABLET | ORAL | Status: AC
  Administered 2013-09-21: 60 mg via ORAL
  Filled 2013-09-21: qty 3

## 2013-09-21 MED ORDER — PREDNISONE 20 MG PO TABS: 40.0000 mg | ORAL_TABLET | Freq: Every day | ORAL | Status: DC

## 2013-09-21 MED ORDER — IOHEXOL 350 MG/ML SOLN
80.0000 mL | Freq: Once | INTRAVENOUS | Status: AC | PRN
  Administered 2013-09-21: 60 mL via INTRAVENOUS

## 2013-09-21 NOTE — ED Notes (Signed)
Pt c/o center chest pain onset Wednesday. Pain does not radiate and also experience intermittent shortness of breath, N/V. Pt seen here on the 11th, reports she was told to come back today.

## 2013-09-21 NOTE — Discharge Instructions (Signed)
As discussed at today's evaluation was largely reassuring, though there is evidence of possible pneumonia. The most important thing you can do is to stop smoking. Please also take only medication as directed, and her follow up with any primary care physician in one week. Return here for concerning changes in your condition.
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
## 2016-07-31 ENCOUNTER — Encounter (HOSPITAL_COMMUNITY): Payer: Self-pay | Admitting: Emergency Medicine

## 2016-07-31 ENCOUNTER — Emergency Department (HOSPITAL_COMMUNITY)
Admission: EM | Admit: 2016-07-31 | Discharge: 2016-07-31 | Disposition: A | Discharge status: Home / Self Care | Payer: Self-pay | Attending: Physician Assistant | Admitting: Physician Assistant

## 2016-07-31 DIAGNOSIS — C4490 Unspecified malignant neoplasm of skin, unspecified: Secondary | ICD-10-CM | POA: Insufficient documentation

## 2016-07-31 DIAGNOSIS — I10 Essential (primary) hypertension: Secondary | ICD-10-CM | POA: Insufficient documentation

## 2016-07-31 DIAGNOSIS — Z79899 Other long term (current) drug therapy: Secondary | ICD-10-CM | POA: Insufficient documentation

## 2016-07-31 DIAGNOSIS — F17210 Nicotine dependence, cigarettes, uncomplicated: Secondary | ICD-10-CM | POA: Insufficient documentation

## 2016-07-31 HISTORY — DX: Unspecified viral hepatitis C without hepatic coma: B19.20

## 2016-07-31 LAB — RAPID HIV SCREEN (HIV 1/2 AB+AG)
HIV 1/2 Antibodies: NONREACTIVE
HIV-1 P24 Antigen - HIV24: NONREACTIVE

## 2016-07-31 MED ORDER — CLOTRIMAZOLE 1 % EX CREA
1.0000 "application " | TOPICAL_CREAM | Freq: Two times a day (BID) | CUTANEOUS | Status: DC
  Administered 2016-07-31: 1 via TOPICAL
  Filled 2016-07-31: qty 15

## 2016-07-31 NOTE — Discharge Instructions (Signed)
Used a fungal medication where you have the redness on the external area of your vagina. We also concerned about the area in her face and you need follow-up with a dermatologist as soon as possible. We'll continue the phone number for [facility] which accepts patients without insurance.  To find a primary care or specialty doctor please call [phone] or [phone] to access "[employer] a Doctor Service."  You may also go on the [facility] website at [email or web address]  There are also multiple [facility]  and [facility] practices throughout the [place] that are frequently accepting new patients. You may find a clinic that is close to your home and contact them.  [facility] -  [address] [place] [address] [phone]  [facility] in [place] (also locations in [place] and [place]) -  [place] [address]  [place] [address] [phone]
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
## 2016-07-31 NOTE — ED Triage Notes (Signed)
Per pt, states abscess on right cheek and on her vagina-states no pain-only when her and her husband "make love"-states was diagnosed with Hep C and wants medicine for that

## 2016-08-01 LAB — RPR: RPR Ser Ql: NONREACTIVE

## 2016-10-26 ENCOUNTER — Encounter (HOSPITAL_COMMUNITY): Payer: Self-pay | Admitting: *Deleted

## 2016-10-26 ENCOUNTER — Emergency Department (HOSPITAL_COMMUNITY)
Admission: EM | Admit: 2016-10-26 | Discharge: 2016-10-26 | Disposition: A | Discharge status: Home / Self Care | Payer: Self-pay | Attending: Emergency Medicine | Admitting: Emergency Medicine

## 2016-10-26 ENCOUNTER — Emergency Department (HOSPITAL_COMMUNITY): Payer: Self-pay

## 2016-10-26 DIAGNOSIS — R202 Paresthesia of skin: Secondary | ICD-10-CM | POA: Insufficient documentation

## 2016-10-26 DIAGNOSIS — I10 Essential (primary) hypertension: Secondary | ICD-10-CM | POA: Insufficient documentation

## 2016-10-26 DIAGNOSIS — R21 Rash and other nonspecific skin eruption: Secondary | ICD-10-CM

## 2016-10-26 DIAGNOSIS — F17210 Nicotine dependence, cigarettes, uncomplicated: Secondary | ICD-10-CM | POA: Insufficient documentation

## 2016-10-26 DIAGNOSIS — F10920 Alcohol use, unspecified with intoxication, uncomplicated: Secondary | ICD-10-CM | POA: Insufficient documentation

## 2016-10-26 DIAGNOSIS — B1920 Unspecified viral hepatitis C without hepatic coma: Secondary | ICD-10-CM | POA: Insufficient documentation

## 2016-10-26 DIAGNOSIS — N3090 Cystitis, unspecified without hematuria: Secondary | ICD-10-CM | POA: Insufficient documentation

## 2016-10-26 DIAGNOSIS — Z79899 Other long term (current) drug therapy: Secondary | ICD-10-CM | POA: Insufficient documentation

## 2016-10-26 DIAGNOSIS — R42 Dizziness and giddiness: Secondary | ICD-10-CM | POA: Insufficient documentation

## 2016-10-26 DIAGNOSIS — B373 Candidiasis of vulva and vagina: Secondary | ICD-10-CM | POA: Insufficient documentation

## 2016-10-26 DIAGNOSIS — B3731 Acute candidiasis of vulva and vagina: Secondary | ICD-10-CM

## 2016-10-26 HISTORY — DX: Alcohol abuse, uncomplicated: F10.10

## 2016-10-26 LAB — URINALYSIS, ROUTINE W REFLEX MICROSCOPIC
BILIRUBIN URINE: NEGATIVE
Glucose, UA: NEGATIVE mg/dL
KETONES UR: NEGATIVE mg/dL
Nitrite: POSITIVE — AB
Protein, ur: NEGATIVE mg/dL
Specific Gravity, Urine: 1.005 (ref 1.005–1.030)
pH: 5 (ref 5.0–8.0)

## 2016-10-26 LAB — COMPREHENSIVE METABOLIC PANEL
ALK PHOS: 65 U/L (ref 38–126)
ALT: 30 U/L (ref 14–54)
AST: 76 U/L — ABNORMAL HIGH (ref 15–41)
Albumin: 3.7 g/dL (ref 3.5–5.0)
Anion gap: 13 (ref 5–15)
BILIRUBIN TOTAL: 0.3 mg/dL (ref 0.3–1.2)
BUN: <5 mg/dL — ABNORMAL LOW (ref 6–20)
CO2: 21 mmol/L — ABNORMAL LOW (ref 22–32)
Calcium: 8.9 mg/dL (ref 8.9–10.3)
Chloride: 106 mmol/L (ref 101–111)
Creatinine, Ser: 0.65 mg/dL (ref 0.44–1.00)
GFR calc non Af Amer: >60 mL/min (ref >60)
Glucose, Bld: 96 mg/dL (ref 65–99)
Potassium: 3.5 mmol/L (ref 3.5–5.1)
Sodium: 140 mmol/L (ref 135–145)
TOTAL PROTEIN: 10 g/dL — AB (ref 6.5–8.1)

## 2016-10-26 LAB — DIFFERENTIAL
Basophils Absolute: 0 10*3/uL (ref 0.0–0.1)
Basophils Relative: 0 %
Eosinophils Absolute: 0.1 10*3/uL (ref 0.0–0.7)
Eosinophils Relative: 1 %
LYMPHS ABS: 2.5 10*3/uL (ref 0.7–4.0)
LYMPHS PCT: 30 %
Monocytes Absolute: 0.8 10*3/uL (ref 0.1–1.0)
Monocytes Relative: 10 %
Neutro Abs: 5 10*3/uL (ref 1.7–7.7)
Neutrophils Relative %: 59 %

## 2016-10-26 LAB — CBC
HCT: 39.5 % (ref 36.0–46.0)
HEMOGLOBIN: 13.6 g/dL (ref 12.0–15.0)
MCH: 30.8 pg (ref 26.0–34.0)
MCHC: 34.4 g/dL (ref 30.0–36.0)
MCV: 89.6 fL (ref 78.0–100.0)
Platelets: 194 10*3/uL (ref 150–400)
RBC: 4.41 MIL/uL (ref 3.87–5.11)
RDW: 13.2 % (ref 11.5–15.5)
WBC: 8.5 10*3/uL (ref 4.0–10.5)

## 2016-10-26 LAB — I-STAT CHEM 8, ED
CALCIUM ION: 1.02 mmol/L — AB (ref 1.15–1.40)
Chloride: 107 mmol/L (ref 101–111)
Creatinine, Ser: 0.8 mg/dL (ref 0.44–1.00)
Glucose, Bld: 99 mg/dL (ref 65–99)
HCT: 43 % (ref 36.0–46.0)
HEMOGLOBIN: 14.6 g/dL (ref 12.0–15.0)
Potassium: 3.6 mmol/L (ref 3.5–5.1)
SODIUM: 143 mmol/L (ref 135–145)
TCO2: 22 mmol/L (ref 0–100)

## 2016-10-26 LAB — RAPID URINE DRUG SCREEN, HOSP PERFORMED
Amphetamines: NOT DETECTED
Barbiturates: NOT DETECTED
Benzodiazepines: NOT DETECTED
Cocaine: NOT DETECTED
OPIATES: NOT DETECTED
Tetrahydrocannabinol: POSITIVE — AB

## 2016-10-26 LAB — ETHANOL: Alcohol, Ethyl (B): 210 mg/dL — ABNORMAL HIGH (ref <5)

## 2016-10-26 LAB — I-STAT TROPONIN, ED: Troponin i, poc: 0.02 ng/mL (ref 0.00–0.08)

## 2016-10-26 LAB — APTT: aPTT: 30 seconds (ref 24–36)

## 2016-10-26 LAB — PROTIME-INR
INR: 0.97
Prothrombin Time: 12.9 seconds (ref 11.4–15.2)

## 2016-10-26 MED ORDER — FLUCONAZOLE 150 MG PO TABS: 150.0000 mg | ORAL_TABLET | ORAL | 0 refills | Status: AC

## 2016-10-26 MED ORDER — NITROFURANTOIN MONOHYD MACRO 100 MG PO CAPS: 100.0000 mg | ORAL_CAPSULE | Freq: Two times a day (BID) | ORAL | 0 refills | Status: DC

## 2016-10-26 NOTE — ED Triage Notes (Addendum)
Pt states that she has had R arm pain and anxiety x 3 days. Pt also states that she has had an irritation in her groin as well. Hx of paranoid schizophrenia. Alert and oriented.

## 2016-10-26 NOTE — ED Notes (Signed)
Family at bedside. 

## 2016-10-26 NOTE — ED Notes (Addendum)
Pt reports 4 days ago she started to have weakness and numbness in her R arm and R leg.  She also reports mild abd pain x 4 days.  Pt reports drinking 2 40oz beer daily.  Pt ambulates without difficulty, grips are strong bilaterally, no facial droop or slurred speech noted.  Pt is A&O x 4.  Pt was eating chips without difficulty but reports sometimes it gets stuck in her throat.  Dysphagia x 4 days.  Pt instructed not to eat or drink at this time.  Pt also reports itchy rash in her groin area x 1 month.  Pt denies any pain unless having sex.
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
## 2016-10-26 NOTE — Discharge Instructions (Signed)
All the results in the ER are normal, labs and imaging. YOU DO HAVE A URINARY INFECTION AND LIKELY A VAGINAL YEAST INFECTION - meds prescribed. We are not sure what is causing your symptoms of dizziness - no strokes. Alcohol level is 3 times over the legal limits. The workup in the ER is not complete, and is limited to screening for life threatening and emergent conditions only, so please see a primary care doctor for further evaluation.  See the skin specialist for the rash.

## 2017-10-15 ENCOUNTER — Emergency Department (HOSPITAL_COMMUNITY)
Admission: EM | Admit: 2017-10-15 | Discharge: 2017-10-15 | Disposition: A | Discharge status: Home / Self Care | Payer: Self-pay | Attending: Emergency Medicine | Admitting: Emergency Medicine

## 2017-10-15 ENCOUNTER — Encounter (HOSPITAL_COMMUNITY): Payer: Self-pay | Admitting: Emergency Medicine

## 2017-10-15 DIAGNOSIS — I10 Essential (primary) hypertension: Secondary | ICD-10-CM | POA: Insufficient documentation

## 2017-10-15 DIAGNOSIS — N3001 Acute cystitis with hematuria: Secondary | ICD-10-CM | POA: Insufficient documentation

## 2017-10-15 DIAGNOSIS — F17210 Nicotine dependence, cigarettes, uncomplicated: Secondary | ICD-10-CM | POA: Insufficient documentation

## 2017-10-15 DIAGNOSIS — R319 Hematuria, unspecified: Secondary | ICD-10-CM

## 2017-10-15 DIAGNOSIS — Z79899 Other long term (current) drug therapy: Secondary | ICD-10-CM | POA: Insufficient documentation

## 2017-10-15 LAB — CBC WITH DIFFERENTIAL/PLATELET
BASOS ABS: 0 10*3/uL (ref 0.0–0.1)
Basophils Relative: 0 %
EOS PCT: 1 %
Eosinophils Absolute: 0.1 10*3/uL (ref 0.0–0.7)
HCT: 39.1 % (ref 36.0–46.0)
Hemoglobin: 13.1 g/dL (ref 12.0–15.0)
LYMPHS PCT: 23 %
Lymphs Abs: 2 10*3/uL (ref 0.7–4.0)
MCH: 29.2 pg (ref 26.0–34.0)
MCHC: 33.5 g/dL (ref 30.0–36.0)
MCV: 87.1 fL (ref 78.0–100.0)
MONO ABS: 0.8 10*3/uL (ref 0.1–1.0)
Monocytes Relative: 9 %
Neutro Abs: 5.9 10*3/uL (ref 1.7–7.7)
Neutrophils Relative %: 67 %
PLATELETS: 367 10*3/uL (ref 150–400)
RBC: 4.49 MIL/uL (ref 3.87–5.11)
RDW: 14.4 % (ref 11.5–15.5)
WBC: 8.8 10*3/uL (ref 4.0–10.5)

## 2017-10-15 LAB — COMPREHENSIVE METABOLIC PANEL
ALBUMIN: 3 g/dL — AB (ref 3.5–5.0)
ALK PHOS: 57 U/L (ref 38–126)
ALT: 15 U/L (ref 0–44)
AST: 28 U/L (ref 15–41)
Anion gap: 7 (ref 5–15)
BILIRUBIN TOTAL: 0.9 mg/dL (ref 0.3–1.2)
BUN: 5 mg/dL — ABNORMAL LOW (ref 6–20)
CALCIUM: 9.1 mg/dL (ref 8.9–10.3)
CO2: 25 mmol/L (ref 22–32)
Chloride: 105 mmol/L (ref 98–111)
Creatinine, Ser: 0.86 mg/dL (ref 0.44–1.00)
GFR calc Af Amer: >60 mL/min (ref >60)
GFR calc non Af Amer: >60 mL/min (ref >60)
GLUCOSE: 90 mg/dL (ref 70–99)
POTASSIUM: 4.1 mmol/L (ref 3.5–5.1)
Sodium: 137 mmol/L (ref 135–145)
TOTAL PROTEIN: 8.5 g/dL — AB (ref 6.5–8.1)

## 2017-10-15 LAB — URINALYSIS, ROUTINE W REFLEX MICROSCOPIC
Bilirubin Urine: NEGATIVE
Glucose, UA: NEGATIVE mg/dL
Ketones, ur: NEGATIVE mg/dL
Nitrite: NEGATIVE
PH: 6 (ref 5.0–8.0)
Protein, ur: NEGATIVE mg/dL
SPECIFIC GRAVITY, URINE: 1.003 — AB (ref 1.005–1.030)
WBC, UA: >50 WBC/hpf — ABNORMAL HIGH (ref 0–5)

## 2017-10-15 MED ORDER — PHENAZOPYRIDINE HCL 200 MG PO TABS: 200.0000 mg | ORAL_TABLET | Freq: Three times a day (TID) | ORAL | 0 refills | Status: DC

## 2017-10-15 MED ORDER — NITROFURANTOIN MONOHYD MACRO 100 MG PO CAPS: 100.0000 mg | ORAL_CAPSULE | Freq: Two times a day (BID) | ORAL | 0 refills | Status: DC

## 2017-10-15 NOTE — Discharge Instructions (Addendum)
Call [facility] to see if they can see you for further care.  We suspect that the blood in the urine is because of infection.  Start taking the antibiotics as prescribed.   Please return to the ER if your symptoms worsen; you have increased pain, fevers, chills, inability to keep any medications down, confusion. Otherwise see the outpatient doctor as requested.

## 2017-10-15 NOTE — ED Triage Notes (Signed)
Pt c/o body aches and blood in urine that has been on going for couple weeks. Tried Bengay rub but hasnt helped with pains.
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
## 2018-04-07 ENCOUNTER — Encounter: Payer: Self-pay | Admitting: Family Medicine

## 2018-04-08 ENCOUNTER — Inpatient Hospital Stay: Payer: Self-pay | Admitting: Family Medicine

## 2018-06-19 ENCOUNTER — Encounter: Payer: Self-pay | Admitting: Internal Medicine

## 2018-06-19 ENCOUNTER — Ambulatory Visit: Payer: Self-pay | Attending: Internal Medicine | Admitting: Internal Medicine

## 2018-06-19 ENCOUNTER — Ambulatory Visit: Payer: Self-pay | Admitting: Internal Medicine

## 2018-06-19 ENCOUNTER — Other Ambulatory Visit: Payer: Self-pay

## 2018-06-19 VITALS — BP 100/70 | HR 92 | Temp 98.0°F | Resp 16 | Ht 61.0 in | Wt 109.8 lb

## 2018-06-19 DIAGNOSIS — L989 Disorder of the skin and subcutaneous tissue, unspecified: Secondary | ICD-10-CM

## 2018-06-19 DIAGNOSIS — F319 Bipolar disorder, unspecified: Secondary | ICD-10-CM | POA: Insufficient documentation

## 2018-06-19 DIAGNOSIS — I10 Essential (primary) hypertension: Secondary | ICD-10-CM

## 2018-06-19 DIAGNOSIS — F17200 Nicotine dependence, unspecified, uncomplicated: Secondary | ICD-10-CM | POA: Insufficient documentation

## 2018-06-19 DIAGNOSIS — Z1159 Encounter for screening for other viral diseases: Secondary | ICD-10-CM

## 2018-06-19 NOTE — Patient Instructions (Addendum)
You should apply for the orange card/cone discount card.  Once you have been approved please let me know so I can submit a referral for you to see the dermatologist.
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
## 2018-06-20 ENCOUNTER — Other Ambulatory Visit: Payer: Self-pay | Admitting: Internal Medicine

## 2018-06-20 DIAGNOSIS — R768 Other specified abnormal immunological findings in serum: Secondary | ICD-10-CM

## 2018-06-20 LAB — HEPATITIS C ANTIBODY: Hep C Virus Ab: >11 s/co ratio — ABNORMAL HIGH (ref 0.0–0.9)

## 2018-06-24 ENCOUNTER — Other Ambulatory Visit: Payer: Self-pay

## 2018-06-24 ENCOUNTER — Telehealth: Payer: Self-pay

## 2018-06-24 NOTE — Telephone Encounter (Signed)
Contacted pt to go over lab results pt is aware and pt is scheduled for a lab visit for Friday June 27, 2018

## 2018-06-27 ENCOUNTER — Other Ambulatory Visit: Payer: Self-pay

## 2018-06-27 ENCOUNTER — Ambulatory Visit: Payer: Self-pay | Attending: Family Medicine

## 2018-06-27 DIAGNOSIS — R768 Other specified abnormal immunological findings in serum: Secondary | ICD-10-CM

## 2018-06-27 DIAGNOSIS — B182 Chronic viral hepatitis C: Secondary | ICD-10-CM

## 2018-07-01 LAB — HEPATITIS C VRS RNA DETECT BY PCR-QUAL: HCV RNA NAA QUALITATIVE: POSITIVE — AB

## 2018-07-03 ENCOUNTER — Other Ambulatory Visit: Payer: Self-pay | Admitting: Family Medicine

## 2018-07-03 ENCOUNTER — Telehealth: Payer: Self-pay

## 2018-07-03 DIAGNOSIS — B182 Chronic viral hepatitis C: Secondary | ICD-10-CM

## 2018-07-03 NOTE — Addendum Note (Signed)
Addended by: Octaviano Glow on: 07/03/2018 09:04 AM   Modules accepted: Orders

## 2018-07-03 NOTE — Telephone Encounter (Signed)
Contacted pt to go over lab results pt is aware and doesn't have any questions or concerns 

## 2018-07-16 ENCOUNTER — Telehealth: Payer: Self-pay | Admitting: Internal Medicine

## 2018-07-16 DIAGNOSIS — B182 Chronic viral hepatitis C: Secondary | ICD-10-CM

## 2018-07-16 DIAGNOSIS — Z114 Encounter for screening for human immunodeficiency virus [HIV]: Secondary | ICD-10-CM

## 2018-07-16 NOTE — Telephone Encounter (Signed)
PC placed to pt today.  I informed pt that her blood test confirms that she has hepatitis C.  I was waiting for the Quant level to come back for over 1.5 wks.  I think the lab did not have sufficient blood to run the test.  I have requested that she return to the lab to have this done.  I will also check for immunity to hep A and B so that she can start getting vaccines if she is not immune.   Recommend also testing for HIV which she is agreeable to.  She plans to come to lab 07/21/2018 to have testing done.  Once she has been approved for [employer], I can refer to ID. Told pt to avoid sharing tooth brushes, razors or anything that can potentially have her blood on it.  Husband she states has already been tested for hep C and was negative.

## 2018-07-17 ENCOUNTER — Telehealth: Payer: Self-pay | Admitting: Internal Medicine

## 2018-07-17 NOTE — Addendum Note (Signed)
Addended by: Karle Plumber B on: 07/17/2018 10:01 AM   Modules accepted: Orders

## 2018-07-17 NOTE — Telephone Encounter (Signed)
Opened in error

## 2018-07-19 LAB — HEPATITIS C GENOTYPE

## 2018-07-19 LAB — HCV RNA QUANT
HCV log10: 6.534 log10 IU/mL
Hepatitis C Quantitation: 3420000 IU/mL

## 2018-07-19 LAB — SPECIMEN STATUS REPORT

## 2018-07-21 ENCOUNTER — Ambulatory Visit: Payer: Self-pay | Attending: Family Medicine

## 2018-07-21 ENCOUNTER — Other Ambulatory Visit: Payer: Self-pay

## 2018-07-21 DIAGNOSIS — Z114 Encounter for screening for human immunodeficiency virus [HIV]: Secondary | ICD-10-CM

## 2018-07-21 DIAGNOSIS — B182 Chronic viral hepatitis C: Secondary | ICD-10-CM

## 2018-07-23 ENCOUNTER — Telehealth: Payer: Self-pay

## 2018-07-23 NOTE — Telephone Encounter (Signed)
Pt is scheduled for a televisit tomorrow 07/24/18 a230pm

## 2018-07-23 NOTE — Telephone Encounter (Signed)
Contacted pt to go over lab results pt is aware   Dr. Wynetta Emery pt has some concerns. Pt states will hep b cause pain during relations. Pt states when her and her spouse have relations she has pain in her pelvic area. Pt states she doesn't be wet like she use to be  Please f/u

## 2018-07-24 ENCOUNTER — Ambulatory Visit: Payer: Self-pay | Attending: Internal Medicine | Admitting: Internal Medicine

## 2018-07-24 ENCOUNTER — Other Ambulatory Visit: Payer: Self-pay

## 2018-07-24 DIAGNOSIS — N941 Unspecified dyspareunia: Secondary | ICD-10-CM

## 2018-07-24 DIAGNOSIS — B182 Chronic viral hepatitis C: Secondary | ICD-10-CM

## 2018-07-24 LAB — HCV RNA QUANT
HCV log10: 6.462 log10 IU/mL
Hepatitis C Quantitation: 2900000 IU/mL

## 2018-07-24 LAB — HIV ANTIBODY (ROUTINE TESTING W REFLEX): HIV Screen 4th Generation wRfx: NONREACTIVE

## 2018-07-24 LAB — HEPATITIS C GENOTYPE

## 2018-07-24 LAB — HEPATITIS A ANTIBODY, TOTAL: hep A Total Ab: POSITIVE — AB

## 2018-07-24 LAB — HEPATITIS B SURFACE ANTIGEN: Hepatitis B Surface Ag: NEGATIVE

## 2018-07-24 NOTE — Progress Notes (Signed)
Virtual Visit via Telephone Note  I connected with Alexandria Buck on 07/24/18 at 3:03 p.m by telephone from my office and verified that I am speaking with the correct person using two identifiers.  Patient is in her car waiting for a friend.  Only the patient and myself participated in this telephone encounter.   I discussed the limitations, risks, security and privacy concerns of performing an evaluation and management service by telephone and the availability of in person appointments. I also discussed with the patient that there may be a patient responsible charge related to this service. The patient expressed understanding and agreed to proceed.   History of Present Illness: Patient with history of bipolar disorder, HTN, tob dep, hep C  Hep C: Patient recently confirmed to be hep C positive.  She has hepatitis a antibodies but has no immunity for hepatitis B.  We have arranged for her to come in next week to start the hep B vaccine series.  Patient states that she has completed and collected all paperwork for the orange card/cone discount and tried to drop them off last week so that she can get an appointment with the financial counselor but was not allowed to do so.  Patient advised that we are currently holding off on scheduling those appointments due to COVID-19 but should resume sometime in the near future.  She is anxious about wanting to see the infectious disease specialist to start treatment for hepatitis C and also to be referred to the dermatologist for the lesions on her skin for which I had seen her on her initial visit    Patient complains of pain during intercourse.  She had not been sexually active for at least 6 months or more and then tried having intercourse about a week ago.  She states that it was very painful and she was not able to produce much self lubrication.  She did have a little bleeding post intercourse.  Patient is postmenopausal.      Observations/Objective: No  direct observations done as this was a telephone encounter.  Assessment and Plan: 1. Chronic hepatitis C without hepatic coma (HCC) Keep appt next wk to start hep B vaccine series I will have my CMA check with the front desk to see when they will start scheduling appointments with the financial counselor so that patient can get her completed forms and supporting documents in to be considered for the orange card/cone discount.  Once approved we will refer to infectious disease specialist for treatment  2. Dyspareunia, female Likely due to being postmenopausal.  I recommend that she try to use K-Y jelly and Replens vaginal moisturizer both of which are found over-the-counter.  She is overdue for a Pap smear.  We will bring her in in about 6 to 7 weeks to have this done   Follow Up Instructions: 6 to 7 weeks for Pap smear  I discussed the assessment and treatment plan with the patient. The patient was provided an opportunity to ask questions and all were answered. The patient agreed with the plan and demonstrated an understanding of the instructions.   The patient was advised to call back or seek an in-person evaluation if the symptoms worsen or if the condition fails to improve as anticipated.  I provided 13 minutes of non-face-to-face time during this encounter.   Karle Plumber, MD

## 2018-07-24 NOTE — Progress Notes (Signed)
Pt has questions regarding her blood work

## 2018-07-28 ENCOUNTER — Other Ambulatory Visit: Payer: Self-pay

## 2018-07-28 ENCOUNTER — Ambulatory Visit: Payer: Self-pay | Attending: Family Medicine | Admitting: Emergency Medicine

## 2018-07-28 VITALS — Ht 60.0 in | Wt 107.6 lb

## 2018-07-28 DIAGNOSIS — Z23 Encounter for immunization: Secondary | ICD-10-CM

## 2018-07-28 NOTE — Progress Notes (Signed)
Patient arrived ambulatory, alert and orientated to clinic.  Patient is in clinic for Hepatitis B injection.  Patient given injection.  Paper work was given.  Patient educated as to vaccination.  Patient acknowledged understanding of  information  Patient discharged

## 2018-08-06 LAB — SPECIMEN STATUS REPORT

## 2018-08-11 LAB — HEPATITIS B SURFACE ANTIBODY,QUALITATIVE: Hep B Surface Ab, Qual: REACTIVE

## 2018-08-11 LAB — SPECIMEN STATUS REPORT

## 2018-08-15 ENCOUNTER — Telehealth: Payer: Self-pay | Admitting: Internal Medicine

## 2018-08-15 NOTE — Telephone Encounter (Signed)
PC placed to pt tody.  I left message on her VM informing her that I just received the results of one of her blood test that was done 07/21/2018.  She is covered for hep B meaning you likely had the vaccine already in the past.  You do not need any further vaccine shots for hep B.  Call me with any questions.

## 2018-08-28 ENCOUNTER — Telehealth: Payer: Self-pay | Admitting: Internal Medicine

## 2018-08-28 DIAGNOSIS — L989 Disorder of the skin and subcutaneous tissue, unspecified: Secondary | ICD-10-CM

## 2018-08-28 NOTE — Telephone Encounter (Signed)
Patient called about the hep C and she wants to know about the skin cancer

## 2018-08-28 NOTE — Telephone Encounter (Signed)
Will forward to pcp

## 2018-09-03 ENCOUNTER — Telehealth: Payer: Self-pay | Admitting: Internal Medicine

## 2018-09-03 NOTE — Telephone Encounter (Signed)
Contacted pt and went over Dr. Wynetta Emery message pt states she was told she had skin cancer her first visit. I informed pt of Dr. Gennaro Africa note from that visit. I have asked pt has she applied for the cone discount/oc pt states she has not because we were not taking any financial appointments. I informed pt that I will see if we can get her an appointment with financial. Spoke with Raynelle Fanning and she made me aware that pt has already spoke with Clifton James. I asked pt if she has already spoke with financial she said she hasn't but I informed pt that it says that she did but I made pt aware that she can bring her application and paperwork up tomorrow and drop it off. Pt states she will drop application and paperwork off tomorrow

## 2018-09-03 NOTE — Telephone Encounter (Signed)
I called Pt to informed that she can bring the application and all the documentation she need to apply for CAFA and OC and I ooin I get it I will call her back to informed if the papers a complete or are she missing documentation, Pt understood an will bring it next week

## 2018-09-03 NOTE — Telephone Encounter (Signed)
Patient called stating that she has been bringing the application to apply for the OC and CAFA. Patient needs to see the dermatologist and patient is having some financial concerns. Please f/u with patient.

## 2018-09-04 NOTE — Telephone Encounter (Signed)
Phone note from my CMA reviewed.  I will go ahead and place the dermatology referral in anticipation that she will be approved for the orange card/cone discount soon.

## 2018-09-11 ENCOUNTER — Other Ambulatory Visit: Payer: Self-pay | Admitting: Internal Medicine

## 2018-09-15 ENCOUNTER — Other Ambulatory Visit: Payer: Self-pay | Admitting: Internal Medicine

## 2018-09-17 ENCOUNTER — Telehealth: Payer: Self-pay | Admitting: Internal Medicine

## 2018-09-17 NOTE — Telephone Encounter (Signed)
Patient called stating she would like to speak to PCP in regards to her health issues. Please follow up

## 2018-09-17 NOTE — Telephone Encounter (Signed)
Returned pt call pt didn't answer lvm asking pt to give me a call back at their earliest convenience

## 2018-09-26 ENCOUNTER — Other Ambulatory Visit: Payer: Self-pay | Admitting: Internal Medicine

## 2018-10-01 ENCOUNTER — Encounter: Payer: Self-pay | Admitting: Internal Medicine

## 2018-10-01 ENCOUNTER — Ambulatory Visit: Payer: Self-pay | Attending: Internal Medicine | Admitting: Internal Medicine

## 2018-10-01 ENCOUNTER — Other Ambulatory Visit: Payer: Self-pay

## 2018-10-01 VITALS — BP 128/88 | HR 84 | Temp 98.8°F | Resp 18 | Ht 61.0 in | Wt 107.0 lb

## 2018-10-01 DIAGNOSIS — F17200 Nicotine dependence, unspecified, uncomplicated: Secondary | ICD-10-CM

## 2018-10-01 DIAGNOSIS — I10 Essential (primary) hypertension: Secondary | ICD-10-CM

## 2018-10-01 DIAGNOSIS — F17210 Nicotine dependence, cigarettes, uncomplicated: Secondary | ICD-10-CM

## 2018-10-01 DIAGNOSIS — F319 Bipolar disorder, unspecified: Secondary | ICD-10-CM

## 2018-10-01 DIAGNOSIS — Z0000 Encounter for general adult medical examination without abnormal findings: Secondary | ICD-10-CM

## 2018-10-01 DIAGNOSIS — Z1239 Encounter for other screening for malignant neoplasm of breast: Secondary | ICD-10-CM

## 2018-10-01 DIAGNOSIS — B182 Chronic viral hepatitis C: Secondary | ICD-10-CM

## 2018-10-01 DIAGNOSIS — L989 Disorder of the skin and subcutaneous tissue, unspecified: Secondary | ICD-10-CM

## 2018-10-01 DIAGNOSIS — L988 Other specified disorders of the skin and subcutaneous tissue: Secondary | ICD-10-CM

## 2018-10-01 DIAGNOSIS — Z01411 Encounter for gynecological examination (general) (routine) with abnormal findings: Secondary | ICD-10-CM

## 2018-10-01 DIAGNOSIS — Z1211 Encounter for screening for malignant neoplasm of colon: Secondary | ICD-10-CM

## 2018-10-01 DIAGNOSIS — Z124 Encounter for screening for malignant neoplasm of cervix: Secondary | ICD-10-CM

## 2018-10-01 DIAGNOSIS — Z2821 Immunization not carried out because of patient refusal: Secondary | ICD-10-CM

## 2018-10-01 MED ORDER — HYDROCHLOROTHIAZIDE 25 MG PO TABS: 25.0000 mg | ORAL_TABLET | Freq: Every day | ORAL | 4 refills | Status: DC
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
## 2018-10-02 LAB — COMPREHENSIVE METABOLIC PANEL
ALT: 27 IU/L (ref 0–32)
AST: 64 IU/L — ABNORMAL HIGH (ref 0–40)
Albumin/Globulin Ratio: 0.7 — ABNORMAL LOW (ref 1.2–2.2)
Albumin: 3.7 g/dL — ABNORMAL LOW (ref 3.8–4.9)
Alkaline Phosphatase: 73 IU/L (ref 39–117)
BUN/Creatinine Ratio: 5 — ABNORMAL LOW (ref 9–23)
BUN: 4 mg/dL — ABNORMAL LOW (ref 6–24)
Bilirubin Total: 0.4 mg/dL (ref 0.0–1.2)
CO2: 18 mmol/L — ABNORMAL LOW (ref 20–29)
Calcium: 9.4 mg/dL (ref 8.7–10.2)
Chloride: 104 mmol/L (ref 96–106)
Creatinine, Ser: 0.75 mg/dL (ref 0.57–1.00)
GFR calc Af Amer: 101 mL/min/{1.73_m2} (ref >59)
GFR calc non Af Amer: 88 mL/min/{1.73_m2} (ref >59)
Globulin, Total: 5.6 g/dL — ABNORMAL HIGH (ref 1.5–4.5)
Glucose: 74 mg/dL (ref 65–99)
Potassium: 4.1 mmol/L (ref 3.5–5.2)
Sodium: 136 mmol/L (ref 134–144)
Total Protein: 9.3 g/dL — ABNORMAL HIGH (ref 6.0–8.5)

## 2018-10-02 LAB — LIPID PANEL
Chol/HDL Ratio: 1.9 ratio (ref 0.0–4.4)
Cholesterol, Total: 98 mg/dL — ABNORMAL LOW (ref 100–199)
HDL: 51 mg/dL (ref >39)
LDL Calculated: 36 mg/dL (ref 0–99)
Triglycerides: 57 mg/dL (ref 0–149)
VLDL Cholesterol Cal: 11 mg/dL (ref 5–40)

## 2018-10-02 LAB — CBC
Hematocrit: 41.6 % (ref 34.0–46.6)
Hemoglobin: 13.8 g/dL (ref 11.1–15.9)
MCH: 30.7 pg (ref 26.6–33.0)
MCHC: 33.2 g/dL (ref 31.5–35.7)
MCV: 93 fL (ref 79–97)
Platelets: 195 10*3/uL (ref 150–450)
RBC: 4.49 x10E6/uL (ref 3.77–5.28)
RDW: 12.2 % (ref 11.7–15.4)
WBC: 7.5 10*3/uL (ref 3.4–10.8)

## 2018-10-02 LAB — CYTOLOGY - PAP
Adequacy: ABSENT
Diagnosis: NEGATIVE
HPV: NOT DETECTED

## 2018-10-02 LAB — CERVICOVAGINAL ANCILLARY ONLY
Chlamydia: NEGATIVE
Neisseria Gonorrhea: NEGATIVE
Trichomonas: POSITIVE — AB

## 2018-10-02 NOTE — Telephone Encounter (Signed)
-----   Message from Ladell Pier, MD sent at 10/02/2018 11:50 AM EDT ----- Blood count is nl meaning no anemia. Cholesterol levels are nl.  Kidney function is nl.  One of her liver enzyme mildly elevated and likely due to known hepatitis C diagnosis.

## 2018-10-02 NOTE — Telephone Encounter (Signed)
Patient verified DOB Patient is aware of no longer being anemic. Cholesterol and kidney function is good. Patient is aware of liver enzyme being elevated due to Dx of Hep c. Patient completing her fecal test and will follow up with [facility].

## 2018-10-03 ENCOUNTER — Other Ambulatory Visit: Payer: Self-pay | Admitting: Internal Medicine

## 2018-10-03 ENCOUNTER — Telehealth: Payer: Self-pay

## 2018-10-03 MED ORDER — METRONIDAZOLE 500 MG PO TABS: 500.0000 mg | ORAL_TABLET | Freq: Two times a day (BID) | ORAL | 0 refills | Status: DC

## 2018-10-03 NOTE — Telephone Encounter (Signed)
Patient name and DOB has been verified Patient was informed of lab results. Patient had no questions.  

## 2018-10-03 NOTE — Progress Notes (Signed)
Please share PAP and blood results

## 2018-10-03 NOTE — Telephone Encounter (Signed)
-----   Message from Trecia Rogers, [place] sent at 10/03/2018 12:57 PM EDT ----- Please share PAP and blood results

## 2018-10-06 NOTE — Telephone Encounter (Signed)
-----   Message from Ladell Pier, MD sent at 10/03/2018  4:13 PM EDT ----- Let pt know that she tested positive for trichomonas which is a sexually transmitted infection.  Both she and her partner should be treated before they have sex without use of a condom again.  I have sent a rxn to [facility] on [address] for Flagyl abx for her.  She needs to pick up from pharmacy.  Test for chlamydia and gonorrhea were negative.

## 2018-10-06 NOTE — Telephone Encounter (Signed)
Medical Assistant left message on patient's home and cell voicemail. Voicemail states to give a call back to [place] with [facility] at [phone]. Patient is aware of results and treatment.

## 2018-10-20 ENCOUNTER — Telehealth: Payer: Self-pay | Admitting: Family

## 2018-10-20 ENCOUNTER — Telehealth: Payer: Self-pay | Admitting: Pharmacy Technician

## 2018-10-20 NOTE — Telephone Encounter (Signed)
[facility] Patient Advocate Encounter    Findings of the benefits investigation conducted this morning via test claims for the patient's upcoming appointment on 10/21/2018 are as follows:   Insurance: no insurance coverage found [facility] Patient Advocate will follow up once patient arrives for their appointment and confirm this status. Will fill out patient assistance application and have patient sign at their appointment  Bartholomew Crews, [place] Patient [facility] for Infectious Disease Phone: [phone] Fax: [phone] 10/20/2018 3:37 PM

## 2018-10-20 NOTE — Telephone Encounter (Signed)
COVID-19 Pre-Screening Questions:10/20/18 ° °Do you currently have a fever (>100 °F), chills or unexplained body aches? NO ° °Are you currently experiencing new cough, shortness of breath, sore throat, runny nose? NO °•  °Have you recently travelled outside the state of [place] in the last 14 days? NO  °•  °Have you been in contact with someone that is currently pending confirmation of Covid19 testing or has been confirmed to have the Covid19 virus?  NO ° °**If the patient answers NO to ALL questions -  advise the patient to please call the clinic before coming to the office should any symptoms develop.  ° ° °

## 2018-10-21 ENCOUNTER — Ambulatory Visit (INDEPENDENT_AMBULATORY_CARE_PROVIDER_SITE_OTHER): Payer: Self-pay | Admitting: Family

## 2018-10-21 ENCOUNTER — Encounter: Payer: Self-pay | Admitting: Family

## 2018-10-21 ENCOUNTER — Other Ambulatory Visit: Payer: Self-pay

## 2018-10-21 VITALS — BP 134/89 | HR 102 | Temp 98.5°F | Wt 103.0 lb

## 2018-10-21 DIAGNOSIS — B182 Chronic viral hepatitis C: Secondary | ICD-10-CM
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
## 2018-10-21 NOTE — Patient Instructions (Signed)
Nice to meet you!  We will check your blood work today and get you started on medication.  Limit acetaminophen (Tylenol) usage to no more than 2 grams (2,000 mg) per day.  Avoid alcohol.  Do not share toothbrushes or razors.  Practice safe sex to protect against transmission as well as sexually transmitted disease.    Hepatitis C Hepatitis C is a viral infection of the liver. It can lead to scarring of the liver (cirrhosis), liver failure, or liver cancer. Hepatitis C may go undetected for months or years because people with the infection may not have symptoms, or they may have only mild symptoms. What are the causes? This condition is caused by the hepatitis C virus (HCV). The virus can spread from person to person (is contagious) through:  Blood.  Childbirth. A woman who has hepatitis C can pass it to her baby during birth.  Bodily fluids, such as breast milk, tears, semen, vaginal fluids, and saliva.  Blood transfusions or organ transplants done in the [place] before 1992.  What increases the risk? The following factors may make you more likely to develop this condition:  Having contact with unclean (contaminated) needles or syringes. This may result from: ? Acupuncture. ? Tattoing. ? Body piercing. ? Injecting drugs.  Having unprotected sex with someone who is infected.  Needing treatment to filter your blood (kidney dialysis).  Having HIV (human immunodeficiency virus) or AIDS (acquired immunodeficiency syndrome).  Working in a job that involves contact with blood or bodily fluids, such as health care.  What are the signs or symptoms? Symptoms of this condition include:  Fatigue.  Loss of appetite.  Nausea.  Vomiting.  Abdominal pain.  Dark yellow urine.  Yellowish skin and eyes (jaundice).  Itchy skin.  Clay-colored bowel movements.  Joint pain.  Bleeding and bruising easily.  Fluid building up in your stomach (ascites).  In some  cases, you may not have any symptoms. How is this diagnosed? This condition is diagnosed with:  Blood tests.  Other tests to check how well your liver is functioning. They may include: ? Magnetic resonance elastography (MRE). This imaging test uses MRIs and sound waves to measure liver stiffness. ? Transient elastography. This imaging test uses ultrasounds to measure liver stiffness. ? Liver biopsy. This test requires taking a small tissue sample from your liver to examine it under a microscope.  How is this treated? Your health care provider may perform noninvasive tests or a liver biopsy to help decide the best course of treatment. Treatment may include:  Antiviral medicines and other medicines.  Follow-up treatments every 6-12 months for infections or other liver conditions.  Receiving a donated liver (liver transplant).  Follow these instructions at home: Medicines  Take over-the-counter and prescription medicines only as told by your health care provider.  Take your antiviral medicine as told by your health care provider. Do not stop taking the antiviral even if you start to feel better.  Do not take any medicines unless approved by your health care provider, including over-the-counter medicines and birth control pills. Activity  Rest as needed.  Do not have sex unless approved by your health care provider.  Ask your health care provider when you may return to school or work. Eating and drinking  Eat a balanced diet with plenty of fruits and vegetables, whole grains, and lowfat (lean) meats or non-meat proteins (such as beans or tofu).  Drink enough fluids to keep your urine clear or pale yellow.  Do not drink alcohol. General instructions  Do not share toothbrushes, nail clippers, or razors.  Wash your hands frequently with soap and water. If soap and water are not available, use hand sanitizer.  Cover any cuts or open sores on your skin to prevent spreading the  virus.  Keep all follow-up visits as told by your health care provider. This is important. You may need follow-up visits every 6-12 months. How is this prevented? There is no vaccine for hepatitis C. The only way to prevent the disease is to reduce the risk of exposure to the virus. Make sure you:  Wash your hands frequently with soap and water. If soap and water are not available, use hand sanitizer.  Do not share needles or syringes.  Practice safe sex and use condoms.  Avoid handling blood or bodily fluids without gloves or other protection.  Avoid getting tattoos or piercings in shops or other locations that are not clean.  Contact a health care provider if:  You have a fever.  You develop abdominal pain.  You pass dark urine.  You pass clay-colored stools.  You develop joint pain. Get help right away if:  You have increasing fatigue or weakness.  You lose your appetite.  You cannot eat or drink without vomiting.  You develop jaundice or your jaundice gets worse.  You bruise or bleed easily. Summary  Hepatitis C is a viral infection of the liver. It can lead to scarring of the liver (cirrhosis), liver failure, or liver cancer.  The hepatitis C virus (HCV) causes this condition. The virus can pass from person to person (is contagious).  You should not take any medicines unless approved by your health care provider. This includes over-the-counter medicines and birth control pills. This information is not intended to replace advice given to you by your health care provider. Make sure you discuss any questions you have with your health care provider. Document Released: 03/23/2000 Document Revised: 05/01/2016 Document Reviewed: 05/01/2016 Elsevier Interactive Patient Education  [employer].

## 2018-10-22 NOTE — Telephone Encounter (Signed)
Met with the patient and had her sign each application for Mavyret and Epclusa. Will submit the proper application once dug determination has been decided. Informed patient of the process from the manufacturer to make sure she answers the pharmacy calls and confirmed her demographic information. Directed her to where to submit [facility] financial paperwork and what documents were needed. I believe there was some confusion between her financial assistance papers for medical expenses and the ones she filled out for manufacturer assistance.

## 2018-10-24 LAB — LIVER FIBROSIS, FIBROTEST-ACTITEST
ALT: 34 U/L — ABNORMAL HIGH (ref 6–29)
Alpha-2-Macroglobulin: 287 mg/dL — ABNORMAL HIGH (ref 106–279)
Apolipoprotein A1: 121 mg/dL (ref 101–198)
Bilirubin: 0.4 mg/dL (ref 0.2–1.2)
Fibrosis Score: 0.57
GGT: 200 U/L — ABNORMAL HIGH (ref 3–70)
Haptoglobin: 152 mg/dL (ref 43–212)
Necroinflammat ACT Score: 0.24
Reference ID: 3008966

## 2018-10-27 ENCOUNTER — Telehealth: Payer: Self-pay | Admitting: Family

## 2018-10-27 ENCOUNTER — Other Ambulatory Visit: Payer: Self-pay | Admitting: Family

## 2018-10-27 ENCOUNTER — Telehealth: Payer: Self-pay | Admitting: Pharmacy Technician

## 2018-10-27 DIAGNOSIS — B182 Chronic viral hepatitis C: Secondary | ICD-10-CM

## 2018-10-27 MED ORDER — SOFOSBUVIR-VELPATASVIR 400-100 MG PO TABS: 1.0000 | ORAL_TABLET | Freq: Every day | ORAL | 2 refills | Status: DC

## 2018-10-27 NOTE — Telephone Encounter (Signed)
Spoke with Alexandria Buck regarding her Hepatitis C testing results after obtaining to name and DOB. She has Genotype 1a Hepatitis C with a viral load of 2.9 million. Will plan for 12 weeks of Epclusa.

## 2018-10-27 NOTE — Telephone Encounter (Signed)
[facility] Patient Advocate Encounter  Completed and sent [employer] application for Epclusa for this patient who is uninsured.    Patient assistance phone number for follow up is [phone].   This encounter will be updated until final determination.   Venida Jarvis. Nadara Mustard [place] Patient [facility] for Infectious Disease Phone: [phone] Fax:  [phone]

## 2018-11-05 NOTE — Telephone Encounter (Signed)
Application has been approved from [employer] and active dates are 11/03/2018 to 01/26/2019. [facility] will send the medication directly to the patient's home address.I spoke with her to make sure she confirms shipment when the company calls. [phone] for any questions.

## 2018-11-14 ENCOUNTER — Encounter: Payer: Self-pay | Admitting: Pharmacy Technician

## 2018-11-20 ENCOUNTER — Telehealth: Payer: Self-pay | Admitting: Pharmacist

## 2018-11-20 NOTE — Telephone Encounter (Signed)
Called patient to discuss Epclusa as I did not get to counsel her before she started.  She did tell [name] last week that she was taking 2 Epclusa tablets daily and [name] educated her to take one tablet per day.  She tells me today that she used the bathroom on herself in the grocery store and has been using the bathroom frequently since starting.  She was also crushing up the Epclusa when she did take it.  Counseled her not to do that and she said she wasn't going to take it anymore and hasn't the last few days.  I tried to tell her that I could send in some imodium for her or asked if she could pick some up from the dollar store but she said she does not have any money. I told her that she cannot take it on and off so if she stops then she has to stop all together.  She said that she will stop.  Greg aware.

## 2018-11-24 NOTE — Telephone Encounter (Signed)
Patient chose to stop taking medication due to reported adverse side effects. I contacted [employer] and [facility] to inform them of this information so they would not ship her another bottle.

## 2018-12-04 ENCOUNTER — Ambulatory Visit: Payer: Self-pay | Admitting: Pharmacist

## 2019-01-05 ENCOUNTER — Ambulatory Visit: Payer: Self-pay | Admitting: Internal Medicine

## 2019-01-06 ENCOUNTER — Ambulatory Visit: Payer: Self-pay | Admitting: Internal Medicine

## 2019-02-03 ENCOUNTER — Telehealth: Payer: Self-pay | Admitting: Internal Medicine

## 2019-02-03 NOTE — Telephone Encounter (Signed)
Please call the patient wants to talk about the liver

## 2019-02-03 NOTE — Telephone Encounter (Signed)
Pt states she is starting to lose weight and she doesn't know why. Pt states now she is weighing 80-90 lbs

## 2019-02-20 ENCOUNTER — Other Ambulatory Visit: Payer: Self-pay

## 2019-02-20 ENCOUNTER — Ambulatory Visit: Payer: Self-pay | Attending: Internal Medicine | Admitting: Internal Medicine

## 2019-02-20 VITALS — Wt 103.4 lb

## 2019-02-20 DIAGNOSIS — A599 Trichomoniasis, unspecified: Secondary | ICD-10-CM

## 2019-02-20 DIAGNOSIS — R300 Dysuria: Secondary | ICD-10-CM

## 2019-02-20 LAB — POCT URINALYSIS DIP (CLINITEK)
Bilirubin, UA: NEGATIVE
Glucose, UA: NEGATIVE mg/dL
Ketones, POC UA: NEGATIVE mg/dL
Nitrite, UA: NEGATIVE
POC PROTEIN,UA: NEGATIVE
Spec Grav, UA: <=1.005 — AB (ref 1.010–1.025)
Urobilinogen, UA: 0.2 E.U./dL
pH, UA: 6 (ref 5.0–8.0)

## 2019-02-20 MED ORDER — METRONIDAZOLE 500 MG PO TABS: 500.0000 mg | ORAL_TABLET | Freq: Two times a day (BID) | ORAL | 0 refills | Status: DC

## 2019-02-20 MED ORDER — CIPROFLOXACIN HCL 500 MG PO TABS: 500.0000 mg | ORAL_TABLET | Freq: Two times a day (BID) | ORAL | 0 refills | Status: DC

## 2019-02-20 NOTE — Progress Notes (Signed)
Pt states she is having a burning sensation   Pt is wanting to be retested for std's  Pt states she doesn't remember if she took the medicine for the Trich   Pt states if she is positive for std again she is wanting the pill and shot

## 2019-02-20 NOTE — Progress Notes (Signed)
Virtual Visit via Telephone Note Pt was schedule for in-person visit and she showed up but did not want to wait to be seen.  So she was allowed to do a self vaginal swab and leave.  She wanted to do a tele visit instead. I connected with Alexandria Buck on 02/20/19 at 3:23 p.m by telephone and verified that I am speaking with the correct person using two identifiers. I am in my office.  The patient is at home.  Only the patient and myself participated in this encounter.  I discussed the limitations, risks, security and privacy concerns of performing an evaluation and management service by telephone and the availability of in person appointments. I also discussed with the patient that there may be a patient responsible charge related to this service. The patient expressed understanding and agreed to proceed.   History of Present Illness: Patient with history ofbipolar disorder, HTN, tob dep, hep C, dyspareunia  Pt c/o burning with urination x 2 wks.  No hematuria. No vaginal dischg or irritation Dx with Trichomas in 09/2018 and Flagyl prescribed but pt never pick up rxn.  Requesting rxn now.  Has a boyfriend. They do oral sex, no vaginal penetration   Observations/Objective: No direct observation done as this was a telephone visit Results for orders placed or performed in visit on 02/20/19  POCT URINALYSIS DIP (CLINITEK)  Result Value Ref Range   Color, UA yellow yellow   Clarity, UA clear clear   Glucose, UA negative negative mg/dL   Bilirubin, UA negative negative   Ketones, POC UA negative negative mg/dL   Spec Grav, UA <=1.005 (A) 1.010 - 1.025   Blood, UA small (A) negative   pH, UA 6.0 5.0 - 8.0   POC PROTEIN,UA negative negative, trace   Urobilinogen, UA 0.2 0.2 or 1.0 E.U./dL   Nitrite, UA Negative Negative   Leukocytes, UA Small (1+) (A) Negative    Assessment and Plan: 1. Dysuria - POCT URINALYSIS DIP (CLINITEK) - Cervicovaginal ancillary only - ciprofloxacin (CIPRO)  500 MG tablet; Take 1 tablet (500 mg total) by mouth 2 (two) times daily.  Dispense: 6 tablet; Refill: 0  2. Trichomonas infection Advise pt that both she and her boyfriend need to be treated before having unprotected sex again - Cervicovaginal ancillary only - metroNIDAZOLE (FLAGYL) 500 MG tablet; Take 1 tablet (500 mg total) by mouth 2 (two) times daily.  Dispense: 14 tablet; Refill: 0  Follow Up Instructions: PRN   I discussed the assessment and treatment plan with the patient. The patient was provided an opportunity to ask questions and all were answered. The patient agreed with the plan and demonstrated an understanding of the instructions.   The patient was advised to call back or seek an in-person evaluation if the symptoms worsen or if the condition fails to improve as anticipated.  I provided 6 minutes of non-face-to-face time during this encounter.   Karle Plumber, MD

## 2019-02-23 LAB — CERVICOVAGINAL ANCILLARY ONLY
Bacterial Vaginitis (gardnerella): POSITIVE — AB
Candida Glabrata: NEGATIVE
Candida Vaginitis: POSITIVE — AB
Chlamydia: NEGATIVE
Comment: NEGATIVE
Comment: NEGATIVE
Comment: NEGATIVE
Comment: NEGATIVE
Comment: NEGATIVE
Comment: NORMAL
Neisseria Gonorrhea: NEGATIVE
Trichomonas: POSITIVE — AB

## 2019-02-24 ENCOUNTER — Other Ambulatory Visit: Payer: Self-pay | Admitting: Internal Medicine

## 2019-02-24 MED ORDER — FLUCONAZOLE 150 MG PO TABS: 150.0000 mg | ORAL_TABLET | Freq: Once | ORAL | 0 refills | Status: AC

## 2019-02-25 ENCOUNTER — Telehealth: Payer: Self-pay

## 2019-02-25 NOTE — Telephone Encounter (Signed)
Patient returned call and call was transferred to the nurse to give results.

## 2019-02-25 NOTE — Telephone Encounter (Signed)
Contacted pt to go over lab results pt didn't answer lvm asking pt to give me a call at her earliest convenience  

## 2019-03-13 ENCOUNTER — Ambulatory Visit: Payer: Self-pay | Attending: Internal Medicine | Admitting: Internal Medicine

## 2019-03-13 ENCOUNTER — Other Ambulatory Visit: Payer: Self-pay

## 2019-03-17 ENCOUNTER — Telehealth: Payer: Self-pay | Admitting: Internal Medicine

## 2019-03-17 NOTE — Telephone Encounter (Signed)
Patient requesting to speak with nurse or Dr. Wynetta Emery. She did not want to share details as it is personal. She stated it is very urgent.

## 2019-04-08 ENCOUNTER — Encounter: Payer: Self-pay | Admitting: Internal Medicine

## 2019-04-08 ENCOUNTER — Ambulatory Visit: Payer: Self-pay | Attending: Internal Medicine | Admitting: Internal Medicine

## 2019-04-08 ENCOUNTER — Telehealth: Payer: Self-pay | Admitting: Internal Medicine

## 2019-04-08 ENCOUNTER — Other Ambulatory Visit: Payer: Self-pay

## 2019-04-08 DIAGNOSIS — L989 Disorder of the skin and subcutaneous tissue, unspecified: Secondary | ICD-10-CM

## 2019-04-08 NOTE — Progress Notes (Signed)
Virtual Visit via Telephone Note  I connected with Alexandria Buck on 04/08/19 at 2:06 p.m EST by telephone and verified that I am speaking with the correct person using two identifiers.   I discussed the limitations, risks, security and privacy concerns of performing an evaluation and management service by telephone and the availability of in person appointments. I also discussed with the patient that there may be a patient responsible charge related to this service. The patient expressed understanding and agreed to proceed.   History of Present Illness: Patient with history ofbipolar disorder, HTN, tob dep, hep C, dyspareunia.    Pt reports that rash on her back is worse. Referred to derm June 2020 but no appt as yet.  Pt advised back then that she needs to apply for the orange card/cone discount card.  Patient states that she is having some difficulty navigating the system in regards to applying for the orange card/cone discount.  She states that she has completed the form and has all of her paperwork and had came to the front desk to turn them in but they did not know what she was talking about.  Observations/Objective:   Assessment and Plan: 1. Skin lesion of back 2. Skin lesion of face -I will have sent a message to financial counselor about her situation to see if he can get her in to review her documents and try to get her approved for the orange card/cone discount.  Patient told to bring in her completed forms and supporting documents when she comes to that appointment.  I will also send a message to our referral coordinator regarding the Derm referral. - Ambulatory referral to Dermatology   Follow Up Instructions: PRN   I discussed the assessment and treatment plan with the patient. The patient was provided an opportunity to ask questions and all were answered. The patient agreed with the plan and demonstrated an understanding of the instructions.   The patient was advised to  call back or seek an in-person evaluation if the symptoms worsen or if the condition fails to improve as anticipated.  I provided 5 minutes of non-face-to-face time during this encounter.   Karle Plumber, MD

## 2019-04-08 NOTE — Telephone Encounter (Signed)
I called the Pt to informed her that she need to call on 04/12/18 at [phone] to schedule an appt for the following week since for next week is full already

## 2019-04-09 ENCOUNTER — Telehealth: Payer: Self-pay | Admitting: Internal Medicine

## 2019-04-09 NOTE — Telephone Encounter (Signed)
-----   Message from Ena Dawley sent at 04/09/2019  7:52 AM EST ----- Regarding: Dermatology Good Morning  Yes, you did on 10/02/2018 and I mailed a letter  to apply to the [employer]   The same day   and Family Medicine won't see her until she get approved . Right now I have patients waiting to be seen there since  3 months or more .  Have a Nice  Day  and  Happy New Year .   ----- Message ----- From: Ladell Pier, MD Sent: 04/08/2019   2:16 PM EST To: Ena Dawley  I referred this patient to the dermatologist since June of this year.  No appointment as yet.  She does not have insurance.  I will be able to get her to the family medicine/dermatology clinic over at [facility]?

## 2019-05-18 ENCOUNTER — Observation Stay (HOSPITAL_COMMUNITY): Payer: Self-pay

## 2019-05-18 ENCOUNTER — Observation Stay (HOSPITAL_COMMUNITY)
Admission: EM | Admit: 2019-05-18 | Discharge: 2019-05-19 | Disposition: A | Discharge status: Home / Self Care | Payer: Self-pay | Attending: Student in an Organized Health Care Education/Training Program | Admitting: Student in an Organized Health Care Education/Training Program

## 2019-05-18 ENCOUNTER — Emergency Department (HOSPITAL_COMMUNITY): Payer: Self-pay

## 2019-05-18 ENCOUNTER — Other Ambulatory Visit: Payer: Self-pay

## 2019-05-18 ENCOUNTER — Encounter (HOSPITAL_COMMUNITY): Payer: Self-pay | Admitting: Emergency Medicine

## 2019-05-18 DIAGNOSIS — F17210 Nicotine dependence, cigarettes, uncomplicated: Secondary | ICD-10-CM | POA: Insufficient documentation

## 2019-05-18 DIAGNOSIS — Z79899 Other long term (current) drug therapy: Secondary | ICD-10-CM | POA: Insufficient documentation

## 2019-05-18 DIAGNOSIS — B961 Klebsiella pneumoniae [K. pneumoniae] as the cause of diseases classified elsewhere: Secondary | ICD-10-CM | POA: Insufficient documentation

## 2019-05-18 DIAGNOSIS — G43809 Other migraine, not intractable, without status migrainosus: Principal | ICD-10-CM | POA: Insufficient documentation

## 2019-05-18 DIAGNOSIS — R55 Syncope and collapse: Secondary | ICD-10-CM

## 2019-05-18 DIAGNOSIS — I110 Hypertensive heart disease with heart failure: Secondary | ICD-10-CM | POA: Insufficient documentation

## 2019-05-18 DIAGNOSIS — B182 Chronic viral hepatitis C: Secondary | ICD-10-CM | POA: Insufficient documentation

## 2019-05-18 DIAGNOSIS — I5022 Chronic systolic (congestive) heart failure: Secondary | ICD-10-CM | POA: Insufficient documentation

## 2019-05-18 DIAGNOSIS — Z20822 Contact with and (suspected) exposure to covid-19: Secondary | ICD-10-CM | POA: Insufficient documentation

## 2019-05-18 DIAGNOSIS — N390 Urinary tract infection, site not specified: Secondary | ICD-10-CM | POA: Diagnosis present

## 2019-05-18 DIAGNOSIS — H8190 Unspecified disorder of vestibular function, unspecified ear: Secondary | ICD-10-CM | POA: Diagnosis present

## 2019-05-18 DIAGNOSIS — F319 Bipolar disorder, unspecified: Secondary | ICD-10-CM | POA: Insufficient documentation

## 2019-05-18 DIAGNOSIS — I10 Essential (primary) hypertension: Secondary | ICD-10-CM | POA: Diagnosis present

## 2019-05-18 LAB — CBC WITH DIFFERENTIAL/PLATELET
Abs Immature Granulocytes: 0.02 10*3/uL (ref 0.00–0.07)
Basophils Absolute: 0 10*3/uL (ref 0.0–0.1)
Basophils Relative: 0 %
Eosinophils Absolute: 0 10*3/uL (ref 0.0–0.5)
Eosinophils Relative: 1 %
HCT: 37.3 % (ref 36.0–46.0)
Hemoglobin: 12.1 g/dL (ref 12.0–15.0)
Immature Granulocytes: 0 %
Lymphocytes Relative: 25 %
Lymphs Abs: 1.5 10*3/uL (ref 0.7–4.0)
MCH: 29.9 pg (ref 26.0–34.0)
MCHC: 32.4 g/dL (ref 30.0–36.0)
MCV: 92.1 fL (ref 80.0–100.0)
Monocytes Absolute: 0.8 10*3/uL (ref 0.1–1.0)
Monocytes Relative: 13 %
Neutro Abs: 3.7 10*3/uL (ref 1.7–7.7)
Neutrophils Relative %: 61 %
Platelets: 185 10*3/uL (ref 150–400)
RBC: 4.05 MIL/uL (ref 3.87–5.11)
RDW: 14 % (ref 11.5–15.5)
WBC: 6 10*3/uL (ref 4.0–10.5)
nRBC: 0 % (ref 0.0–0.2)

## 2019-05-18 LAB — URINALYSIS, ROUTINE W REFLEX MICROSCOPIC
Bilirubin Urine: NEGATIVE
Glucose, UA: NEGATIVE mg/dL
Ketones, ur: NEGATIVE mg/dL
Nitrite: NEGATIVE
Protein, ur: NEGATIVE mg/dL
Specific Gravity, Urine: 1.006 (ref 1.005–1.030)
WBC, UA: >50 WBC/hpf — ABNORMAL HIGH (ref 0–5)
pH: 5 (ref 5.0–8.0)

## 2019-05-18 LAB — COMPREHENSIVE METABOLIC PANEL
ALT: 32 U/L (ref 0–44)
AST: 70 U/L — ABNORMAL HIGH (ref 15–41)
Albumin: 3.1 g/dL — ABNORMAL LOW (ref 3.5–5.0)
Alkaline Phosphatase: 67 U/L (ref 38–126)
Anion gap: 10 (ref 5–15)
BUN: <5 mg/dL — ABNORMAL LOW (ref 6–20)
CO2: 20 mmol/L — ABNORMAL LOW (ref 22–32)
Calcium: 8.8 mg/dL — ABNORMAL LOW (ref 8.9–10.3)
Chloride: 103 mmol/L (ref 98–111)
Creatinine, Ser: 0.94 mg/dL (ref 0.44–1.00)
GFR calc Af Amer: >60 mL/min (ref >60)
GFR calc non Af Amer: >60 mL/min (ref >60)
Glucose, Bld: 84 mg/dL (ref 70–99)
Potassium: 3.4 mmol/L — ABNORMAL LOW (ref 3.5–5.1)
Sodium: 133 mmol/L — ABNORMAL LOW (ref 135–145)
Total Bilirubin: 1.1 mg/dL (ref 0.3–1.2)
Total Protein: 8.4 g/dL — ABNORMAL HIGH (ref 6.5–8.1)

## 2019-05-18 LAB — TROPONIN I (HIGH SENSITIVITY)
Troponin I (High Sensitivity): 27 ng/L — ABNORMAL HIGH (ref <18)
Troponin I (High Sensitivity): 27 ng/L — ABNORMAL HIGH (ref <18)

## 2019-05-18 LAB — RESPIRATORY PANEL BY RT PCR (FLU A&B, COVID)
Influenza A by PCR: NEGATIVE
Influenza B by PCR: NEGATIVE
SARS Coronavirus 2 by RT PCR: NEGATIVE

## 2019-05-18 MED ORDER — SENNOSIDES-DOCUSATE SODIUM 8.6-50 MG PO TABS: 1.0000 | ORAL_TABLET | Freq: Every evening | ORAL | Status: DC | PRN

## 2019-05-18 MED ORDER — HEPARIN SODIUM (PORCINE) 5000 UNIT/ML IJ SOLN
5000.0000 [IU] | Freq: Three times a day (TID) | INTRAMUSCULAR | Status: DC
  Administered 2019-05-18 – 2019-05-19 (×2): 5000 [IU] via SUBCUTANEOUS
  Filled 2019-05-18 (×3): qty 1

## 2019-05-18 MED ORDER — PROMETHAZINE HCL 25 MG PO TABS
12.5000 mg | ORAL_TABLET | Freq: Four times a day (QID) | ORAL | Status: DC | PRN
  Administered 2019-05-18: 22:00:00 12.5 mg via ORAL
  Filled 2019-05-18: qty 1

## 2019-05-18 MED ORDER — POTASSIUM CHLORIDE CRYS ER 20 MEQ PO TBCR
40.0000 meq | EXTENDED_RELEASE_TABLET | Freq: Once | ORAL | Status: AC
  Administered 2019-05-18: 17:00:00 40 meq via ORAL
  Filled 2019-05-18: qty 2

## 2019-05-18 MED ORDER — ACETAMINOPHEN 325 MG PO TABS: 650.0000 mg | ORAL_TABLET | Freq: Four times a day (QID) | ORAL | Status: DC | PRN

## 2019-05-18 MED ORDER — RISPERIDONE 1 MG PO TABS
1.0000 mg | ORAL_TABLET | Freq: Every day | ORAL | Status: DC
  Administered 2019-05-18: 22:00:00 1 mg via ORAL
  Filled 2019-05-18 (×2): qty 1

## 2019-05-18 MED ORDER — LITHIUM CARBONATE 150 MG PO CAPS
150.0000 mg | ORAL_CAPSULE | Freq: Two times a day (BID) | ORAL | Status: DC
  Administered 2019-05-18 – 2019-05-19 (×2): 150 mg via ORAL
  Filled 2019-05-18 (×3): qty 1

## 2019-05-18 MED ORDER — SODIUM CHLORIDE 0.9 % IV SOLN
1.0000 g | Freq: Once | INTRAVENOUS | Status: AC
  Administered 2019-05-18: 14:00:00 1 g via INTRAVENOUS
  Filled 2019-05-18: qty 10

## 2019-05-18 MED ORDER — PAROXETINE HCL 10 MG PO TABS
10.0000 mg | ORAL_TABLET | Freq: Every day | ORAL | Status: DC
  Administered 2019-05-18: 22:00:00 10 mg via ORAL
  Filled 2019-05-18 (×2): qty 1

## 2019-05-18 MED ORDER — ACETAMINOPHEN 650 MG RE SUPP: 650.0000 mg | Freq: Four times a day (QID) | RECTAL | Status: DC | PRN

## 2019-05-18 NOTE — ED Triage Notes (Signed)
Pt reports not being able to sleep the past 2 nights due to dizziness when laying down. Endorses N/V and back pain.
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
## 2019-05-18 NOTE — H&P (Addendum)
Date: 05/18/2019               Patient Name:  Alexandria Buck MRN: [id number]  DOB: 09-18-1960 Age / Sex: 59 y.o., female   PCP: Ladell Pier, MD              Medical Service: Internal Medicine Teaching Service              Attending Physician: Dr. Evette Doffing, Mallie Mussel, *    First Contact: Dr. Marianna Payment, DO Pager: [phone]  Second Contact: Dr. Maricela Bo, [place]            After Hours (After 5p/  First Contact Pager: [phone]  weekends / holidays): Second Contact Pager: [phone]   Chief Complaint: Dizziness  History of Present Illness:   Alexandria Buck is a 59 yo female with hx of HTN, Bipolar disorder, hepatitis C,  who presents with a 2-day history of dizziness with associated nausea and emesis.  She states that she was in her usual state of health until 2 days ago when she had several episodes of dizziness that occurred when standing, sitting and laying down. Her symptoms are worsened by going from a seated to laying down position. She had 1 prior episode roughly a month ago but states that it resolved on its own without medical intervention. She has not been able to sleep for the last 2 nights because of her symptoms and admits to at least 5+ episodes of nonbloody/nonbilious emesis during these episodes. She endorses associated flashing of light in her vision, and temporal headaches last for hours. She is a 1 week history of shortness of breath without chest pain but denies any previous history of shortness of breath.  She states that she has a well formed, nonbloody bowel movement daily without constipation or diarrhea, but endorses a single episode of subjective fever with dysuria and periumbilical abdominal pain that does not radiate during the past week. She denies chest pain, palpitations, syncope, weakness, lower extremity edema, recent changes in her medication, anorexia, sick contacts, or recent travel.   Meds:  Current Meds  Medication Sig  . lithium carbonate 150 MG  capsule Take 150 mg by mouth 2 (two) times daily with a meal.  . PARoxetine (PAXIL) 10 MG tablet Take 10 mg by mouth at bedtime.  . risperiDONE (RISPERDAL) 1 MG tablet Take 1 mg by mouth at bedtime.     Allergies: Allergies as of 05/18/2019  . (No Known Allergies)   Past Medical History:  Diagnosis Date  . Alcohol abuse   . Bipolar disorder ([place])   . Depression   . Hepatitis C   . Hypertension    hx-not taking meds  . No pertinent past medical history   . Pneumonia    had 5/12    Family History: Mother:  Diabetes Sister:  Lung cancer  Social History:  Patient was in [place] with her husband of 21 years She is retired, she is able to perform all ADLs and IADLs independently. She does not currently drive but her brother-in-law will shuttle for however she needs to go. ETOH on weekends, smokes 1/4 pack a day, denies illicit drugs  Review of Systems: A complete ROS was negative except as per HPI.   Physical Exam: Blood pressure (!) 137/111, pulse 93, temperature 98.2 F (36.8 C), temperature source Oral, resp. rate 18, SpO2 100 %.  On room air  Physical Exam  Constitutional: She is oriented to person, place, and  time and well-developed, well-nourished, and in no distress. No distress.  HENT:  Head: Normocephalic and atraumatic.  Eyes: Pupils are equal, round, and reactive to light. Conjunctivae and EOM are normal.  Cardiovascular: Normal rate, regular rhythm, normal heart sounds and intact distal pulses. Exam reveals no gallop and no friction rub.  No murmur heard. Pulmonary/Chest: No respiratory distress. She has rales (bibasilar). She exhibits no tenderness.  Abdominal: Soft. She exhibits no distension. There is abdominal tenderness (periumbilical).  Musculoskeletal:        General: No tenderness or edema. Normal range of motion.     Cervical back: Normal range of motion.  Neurological: She is alert and oriented to person, place, and time. She has normal  sensation, normal strength and intact cranial nerves. She is not disoriented. She displays no weakness, no tremor, facial symmetry and normal speech. No cranial nerve deficit or sensory deficit. Coordination normal.  No nystagmus present on dix-hallpike maneuver, but she did become dizzy.  Skin: Skin is warm and dry. She is not diaphoretic.     HENT:     Head: Normocephalic.  Eyes:     Pupils: Pupils are equal, round, and reactive to light.  Pulmonary:     Comments: Mild tachypnea Abdominal:     Tenderness: There is no abdominal tenderness.  Musculoskeletal:        General: No tenderness.     Cervical back: Neck supple.     Right lower leg: No edema.     Left lower leg: No edema.  Skin:    General: Skin is warm.  Neurological:     Mental Status: She is alert.   Labs: CBC Latest Ref Rng & Units 05/18/2019 10/01/2018 10/15/2017  WBC 4.0 - 10.5 K/uL 6.0 7.5 8.8  Hemoglobin 12.0 - 15.0 g/dL 12.1 13.8 13.1  Hematocrit 36.0 - 46.0 % 37.3 41.6 39.1  Platelets 150 - 400 K/uL 185 195 367   CMP Latest Ref Rng & Units 05/18/2019 10/21/2018 10/01/2018  Glucose 70 - 99 mg/dL 84 - 74  BUN 6 - 20 mg/dL <5(L) - 4(L)  Creatinine 0.44 - 1.00 mg/dL 0.94 - 0.75  Sodium 135 - 145 mmol/L 133(L) - 136  Potassium 3.5 - 5.1 mmol/L 3.4(L) - 4.1  Chloride 98 - 111 mmol/L 103 - 104  CO2 22 - 32 mmol/L 20(L) - 18(L)  Calcium 8.9 - 10.3 mg/dL 8.8(L) - 9.4  Total Protein 6.5 - 8.1 g/dL 8.4(H) - 9.3(H)  Total Bilirubin 0.3 - 1.2 mg/dL 1.1 - 0.4  Alkaline Phos 38 - 126 U/L 67 - 73  AST 15 - 41 U/L 70(H) - 64(H)  ALT 0 - 44 U/L 32 34(H) 27    Ref Range & Units 12:06 10:05  Troponin I (High Sensitivity) <18 ng/L 27High   27High  CM     Urine dipstick shows: WBC's and positive for leukocytes.  Micro exam: >50 WBC's per HPF and Many  bacteria.   EKG:   Sinus rhythm Probable left atrial enlargement Right axis deviation Consider left ventricular hypertrophy Abnormal T, probable ischemia,  widespread Prolonged QT interval  CXR:  1. New cardiomegaly since 2015. 2. Chronic Emphysema (ICD10-J43.9) with new basilar predominant increased pulmonary interstitial opacity. Top differential considerations include interstitial edema, viral/atypical respiratory infection, progressed chronic lung disease.  Assessment & Plan by Problem: Active Problems:   Dizziness   Pre-syncope   Urinary tract infection  MS. Shanholtz is a 59 yo female with hx of HTN, bipolar, chronic  hepatitis C, tobacco dependence who presents with pre-syncope.  #Pre-syncope #Dizziness:  Patient presents with acute onset dizziness with shortness of breath particularly while laying down. Patient denies any triggers or prodromal symptoms leading up to these episodes. She does not have a history of anemia. Orthostatic vitals are WNL, despite being treated with centrally acting medications. She denies a history of cardiovascular disease including arrhythmias or structural heart disease. Her most recent CXR does show new cardiomegaly with bibasilar opacity with orthopnea and dizziness when laying down.  - Orthostatics WNL - CT head without contrast - Echocardiogram   #UTI:   Patient presents with a roughly 1 week history dysuria and periumbilical abdominal pain with a UA positive for leukocytes and bacteria. She admits to only one episode of subjective fever without chills or leukocytosis. Doe shave a history of UTI and trichomonas infection in Nov 2020 treated with cipro and flagyl.   - F/u urine cultures and specificities. - Continue Antibiotic therapy with ceftriaxone IV daily.  #Bipolar Disorder:   Continue home risperdone 1 mg qhs, lithium 150 mg BID, and paroxetine 10 mg daily.  #Chronic Hepatitis C:   Initially diagnosed while incarcerated [id number]  AST 70, ALT wnl.  Saw Mauricio Po at [facility] ID and started Epclusa in Aug 2020, but never completed the course. Patient was not able to tolerate the  pill size.  - Will need follow up with ID to restart medication.  #HTN She has a history of HTN and is taking HCTZ. - Held on admission.  Dispo: Admit patient to Inpatient with expected length of stay greater than 2 midnights.  Signed: Marianna Payment, MD 05/18/2019, 8:23 PM

## 2019-05-19 ENCOUNTER — Ambulatory Visit (HOSPITAL_BASED_OUTPATIENT_CLINIC_OR_DEPARTMENT_OTHER): Payer: Self-pay

## 2019-05-19 DIAGNOSIS — B1920 Unspecified viral hepatitis C without hepatic coma: Secondary | ICD-10-CM

## 2019-05-19 DIAGNOSIS — N390 Urinary tract infection, site not specified: Secondary | ICD-10-CM

## 2019-05-19 DIAGNOSIS — I5022 Chronic systolic (congestive) heart failure: Secondary | ICD-10-CM | POA: Diagnosis present

## 2019-05-19 DIAGNOSIS — G43909 Migraine, unspecified, not intractable, without status migrainosus: Secondary | ICD-10-CM

## 2019-05-19 DIAGNOSIS — Z79899 Other long term (current) drug therapy: Secondary | ICD-10-CM

## 2019-05-19 DIAGNOSIS — B961 Klebsiella pneumoniae [K. pneumoniae] as the cause of diseases classified elsewhere: Secondary | ICD-10-CM

## 2019-05-19 DIAGNOSIS — I110 Hypertensive heart disease with heart failure: Secondary | ICD-10-CM

## 2019-05-19 DIAGNOSIS — R0600 Dyspnea, unspecified: Secondary | ICD-10-CM

## 2019-05-19 LAB — CBC
HCT: 37.4 % (ref 36.0–46.0)
Hemoglobin: 12.5 g/dL (ref 12.0–15.0)
MCH: 30.3 pg (ref 26.0–34.0)
MCHC: 33.4 g/dL (ref 30.0–36.0)
MCV: 90.8 fL (ref 80.0–100.0)
Platelets: 196 10*3/uL (ref 150–400)
RBC: 4.12 MIL/uL (ref 3.87–5.11)
RDW: 13.8 % (ref 11.5–15.5)
WBC: 5.9 10*3/uL (ref 4.0–10.5)
nRBC: 0 % (ref 0.0–0.2)

## 2019-05-19 LAB — BASIC METABOLIC PANEL
Anion gap: 10 (ref 5–15)
BUN: 6 mg/dL (ref 6–20)
CO2: 21 mmol/L — ABNORMAL LOW (ref 22–32)
Calcium: 8.9 mg/dL (ref 8.9–10.3)
Chloride: 107 mmol/L (ref 98–111)
Creatinine, Ser: 1.18 mg/dL — ABNORMAL HIGH (ref 0.44–1.00)
GFR calc Af Amer: 58 mL/min — ABNORMAL LOW (ref >60)
GFR calc non Af Amer: 50 mL/min — ABNORMAL LOW (ref >60)
Glucose, Bld: 85 mg/dL (ref 70–99)
Potassium: 4 mmol/L (ref 3.5–5.1)
Sodium: 138 mmol/L (ref 135–145)

## 2019-05-19 LAB — GLUCOSE, CAPILLARY: Glucose-Capillary: 105 mg/dL — ABNORMAL HIGH (ref 70–99)

## 2019-05-19 LAB — ECHOCARDIOGRAM COMPLETE: Weight: 1734.4 oz

## 2019-05-19 LAB — TSH: TSH: 0.384 u[IU]/mL (ref 0.350–4.500)

## 2019-05-19 MED ORDER — NITROFURANTOIN MACROCRYSTAL 100 MG PO CAPS: 100.0000 mg | ORAL_CAPSULE | Freq: Two times a day (BID) | ORAL | 0 refills | Status: DC

## 2019-05-19 MED ORDER — LISINOPRIL 10 MG PO TABS: 10.0000 mg | ORAL_TABLET | Freq: Every day | ORAL | 0 refills | Status: DC

## 2019-05-19 MED ORDER — METOPROLOL SUCCINATE ER 25 MG PO TB24
12.5000 mg | ORAL_TABLET | Freq: Every day | ORAL | Status: DC
  Administered 2019-05-19: 12.5 mg via ORAL
  Filled 2019-05-19: qty 1

## 2019-05-19 MED ORDER — LISINOPRIL 10 MG PO TABS
10.0000 mg | ORAL_TABLET | Freq: Every day | ORAL | Status: DC
  Administered 2019-05-19: 10 mg via ORAL
  Filled 2019-05-19: qty 1

## 2019-05-19 MED ORDER — CEPHALEXIN 500 MG PO CAPS: 500.0000 mg | ORAL_CAPSULE | Freq: Two times a day (BID) | ORAL | 0 refills | Status: AC

## 2019-05-19 MED ORDER — METOPROLOL SUCCINATE ER 25 MG PO TB24: 12.5000 mg | ORAL_TABLET | Freq: Every day | ORAL | 0 refills | Status: DC

## 2019-05-19 MED FILL — METOPROLOL SUCCINATE ER 25: 25 | 30 days supply | Qty: 15 | Fill #0

## 2019-05-19 MED FILL — LISINOPRIL 10 MG TABS: 10 | 30 days supply | Qty: 30 | Fill #0

## 2019-05-19 MED FILL — CEPHALEXIN 500 MG CAPS: 500 | 5 days supply | Qty: 10 | Fill #0

## 2019-05-19 NOTE — Social Work (Signed)
CSW contacted by RN Amy about transportation needs. Pt has keys and confirmed home address with pt RN. Provided cab voucher, also provided pt with community assistance resources and transportation resources.   CSW signing off. Please consult if any additional needs arise.  Alexander Mt, MSW, [place] Work

## 2019-05-19 NOTE — Progress Notes (Signed)
PT Cancellation Note  Patient Details Name: Alexandria Buck MRN: [id number] DOB: 01-26-1961   Cancelled Treatment:    Reason Eval/Treat Not Completed: Other (comment).  Pt is up walking without assist on hall with OT and decided to hold on PT.  Will check tomorrow and dc if she is not in need of therapy.   Ramond Dial 05/19/2019, 11:19 AM   Mee Hives, PT MS Acute Rehab Dept. Number: [place] and [place]

## 2019-05-19 NOTE — Progress Notes (Signed)
  Echocardiogram 2D Echocardiogram has been performed.  Alexandria Buck 05/19/2019, 11:20 AM

## 2019-05-19 NOTE — Discharge Summary (Addendum)
Name: Alexandria Buck MRN: [id number] DOB: 08-02-1960 59 y.o. PCP: Ladell Pier, MD  Date of Admission: 05/18/2019  9:31 AM Date of Discharge: 05/19/2019 Attending Physician: Lalla Brothers, MD  Discharge Diagnosis: 1. Acute Vestibular Migraine 2. Chronic Heart Failure with reduced EF, new diagnosis 3. UTI  Discharge Medications: Allergies as of 05/19/2019   No Known Allergies      Medication List     STOP taking these medications    hydrochlorothiazide 25 MG tablet Commonly known as: HYDRODIURIL   Sofosbuvir-Velpatasvir 400-100 MG Tabs Commonly known as: Epclusa       TAKE these medications    cephALEXin 500 MG capsule Commonly known as: KEFLEX Take 1 capsule (500 mg total) by mouth 2 (two) times daily for 5 days.   lisinopril 10 MG tablet Commonly known as: ZESTRIL Take 1 tablet (10 mg total) by mouth daily.   lithium carbonate 150 MG capsule Take 150 mg by mouth 2 (two) times daily with a meal.   metoprolol succinate 25 MG 24 hr tablet Commonly known as: TOPROL-XL Take 0.5 tablets (12.5 mg total) by mouth daily.   PARoxetine 10 MG tablet Commonly known as: PAXIL Take 10 mg by mouth at bedtime.   risperiDONE 1 MG tablet Commonly known as: RISPERDAL Take 1 mg by mouth at bedtime.        Disposition and follow-up:   Ms.Kaelin Schmehl was discharged from [facility] in Good condition.  At the hospital follow up visit please address:  1.  Follow-up: A) UTI - make sure patient has finished course of antibiotics B) HTN - female sure patient is tolerating the addition of lisinopril  C) HFrEF - make sure patient has cardiology follow up for ischemic evaluation  D) HCV - make sure patient follows up with ID for treatment  2.  Labs / imaging needed at time of follow-up: BMP   3.  Pending labs/ test needing follow-up: UA sensitivities  Follow-up Appointments: Follow-up Information     Ladell Pier, MD. Call in 1 week(s).     Specialty: Internal Medicine Contact information: [place] [address] [address] Hospital Course by problem list: 1. Acute Vestibular Migraine - Patient presented on 02/08 with a roughly 3-4 day history of dizziness, nausea and vomiting consistent with complex vestibular migraine. He symptoms resolved without medical intervention. She had a similar episode roughly a month ago.    2. UTI - patient presented with a 1 week history of dysuria and suprapubic abdominal pain. UA positive for bacteruria. Culture grew klebsiella. Sensitivities pending. Given one dose fo ceftriaxone 1 g IV. Discharged on a 5 day course of keflex.   3.HFrEF - patient presented with orthopnea and dyspnea on exertion. Echo performed and showed significantly reduced EF.   4. HTN - patient's HCTZ was discontinued and she was stared on lisinopril 10 mg daily.    Discharge Vitals:   BP 107/70 (BP Location: Right Arm)   Pulse 86   Temp 98.1 F (36.7 C) (Oral)   Resp 15   Wt 49.2 kg   SpO2 100%   BMI 20.48 kg/m   Pertinent Labs, Studies, and Procedures:  CBC Latest Ref Rng & Units 05/19/2019 05/18/2019 10/01/2018  WBC 4.0 - 10.5 K/uL 5.9 6.0 7.5  Hemoglobin 12.0 - 15.0 g/dL 12.5 12.1 13.8  Hematocrit 36.0 - 46.0 % 37.4 37.3 41.6  Platelets 150 - 400  K/uL 196 185 195   CMP Latest Ref Rng & Units 05/19/2019 05/18/2019 10/21/2018  Glucose 70 - 99 mg/dL 85 84 -  BUN 6 - 20 mg/dL 6 <5(L) -  Creatinine 0.44 - 1.00 mg/dL 1.18(H) 0.94 -  Sodium 135 - 145 mmol/L 138 133(L) -  Potassium 3.5 - 5.1 mmol/L 4.0 3.4(L) -  Chloride 98 - 111 mmol/L 107 103 -  CO2 22 - 32 mmol/L 21(L) 20(L) -  Calcium 8.9 - 10.3 mg/dL 8.9 8.8(L) -  Total Protein 6.5 - 8.1 g/dL - 8.4(H) -  Total Bilirubin 0.3 - 1.2 mg/dL - 1.1 -  Alkaline Phos 38 - 126 U/L - 67 -  AST 15 - 41 U/L - 70(H) -  ALT 0 - 44 U/L - 32 34(H)   CT Head:  1. No acute intracranial hemorrhage.  2. Age-related atrophy and chronic  microvascular ischemic changes.   Echocardiogram: 1. Left ventricular ejection fraction, by estimation, is 25 to 30%. The  left ventricle has severely decreased function. The left ventrical  demonstrates global hypokinesis. Left ventricular diastolic parameters are  consistent with Grade II diastolic  dysfunction (pseudonormalization). Elevated left ventricular end-diastolic  pressure.   2. Right ventricular systolic function is mildly reduced. The right  ventricular size is mildly enlarged. Tricuspid regurgitation signal is  inadequate for assessing PA pressure.   3. Mild to moderate mitral valve regurgitation. There is moderate  thickening of the mitral valve leaflet(s). There is mild calcification of  the mitral valve leaflet(s extending into the subvalvular apparatus.   4. The aortic valve is tricuspid. Aortic valve regurgitation is not  visualized. Mild to moderate aortic valve sclerosis/calcification is  present, without any evidence of aortic stenosis.   5. The inferior vena cava is normal in size with <50% respiratory  variability, suggesting right atrial pressure of 8 mmHg.    Discharge Instructions: Discharge Instructions     Diet - low sodium heart healthy   Complete by: As directed    Diet - low sodium heart healthy   Complete by: As directed    Diet - low sodium heart healthy   Complete by: As directed    Discharge instructions   Complete by: As directed    You were hospitalized for Complex Migraine. Thank you for allowing [place] to be part of your care.   We arranged for you to follow up at:  [place] please call the clinic at [phone] to make an appointment.   Please note these changes made to your medications:   Please START taking:  1. Lisinopril 10 mg daily 2. Metoprolol 12.5 mg daily   Please STOP taking:  1. Hydrochlorothiazide  2. Epclusa (Please dont take this medication until talking with your Infectious Disease  doctor)  Please make sure to follow up with your primary care doctor.   Please call our clinic if you have any questions or concerns, we may be able to help and keep you from a long and expensive emergency room wait. Our clinic and after hours phone number is [phone], the best time to call is Monday through Friday 9 am to 4 pm but there is always someone available 24/7 if you have an emergency. If you need medication refills please notify your pharmacy one week in advance and they will send [place] a request.   Discharge instructions   Complete by: As directed    You were hospitalized for Complex Migraine. Thank you for allowing [place]  to be part of your care.   We arranged for you to follow up at:  [place] please call the clinic at [phone] to make an appointment.    Please note these changes made to your medications:   Please START taking:  1. Lisinopril 10 mg daily  2. Metoprolol 12.5 mg daily  3. Nitrofurantoin 100 mg twice daily for 5 days    Please STOP taking:  1. Hydrochlorothiazide  2. Epclusa (Please dont take this medication until talking with your Infectious Disease doctor)   Please make sure to follow up with your primary care doctor.   Please call our clinic if you have any questions or concerns, we may be able to help and keep you from a long and expensive emergency room wait. Our clinic and after hours phone number is [phone], the best time to call is Monday through Friday 9 am to 4 pm but there is always someone available 24/7 if you have an emergency. If you need medication refills please notify your pharmacy one week in advance and they will send [place] a request.   Discharge instructions   Complete by: As directed    You were hospitalized for Complex Migraine. Thank you for allowing [place] to be part of your care.   We arranged for you to follow up at:  [place] please call the clinic at [phone] to make an  appointment.    Please note these changes made to your medications:   Please START taking:  1. Lisinopril 10 mg daily  2. Metoprolol 12.5 mg daily  3. Keflex 500 mg BID for 5 days   Please STOP taking:  1. Hydrochlorothiazide  2. Epclusa (Please dont take this medication until talking with your Infectious Disease doctor)   Please make sure to follow up with your primary care doctor.   Please call our clinic if you have any questions or concerns, we may be able to help and keep you from a long and expensive emergency room wait. Our clinic and after hours phone number is [phone], the best time to call is Monday through Friday 9 am to 4 pm but there is always someone available 24/7 if you have an emergency. If you need medication refills please notify your pharmacy one week in advance and they will send [place] a request.   Increase activity slowly   Complete by: As directed    Increase activity slowly   Complete by: As directed    Increase activity slowly   Complete by: As directed        Signed: Marianna Payment, MD 05/19/2019, 8:50 PM   Pager: [phone]

## 2019-05-19 NOTE — Progress Notes (Signed)
Subjective: HD#1 Events Overnight: no events overnight.  Patient was seen this morning on rounds.  She was resting comfortably in bed.  She states that her dizziness, nausea and vomiting have resolved.  She states that she is feeling close to baseline.  I reviewed all pertinent labs and imaging studies with her and counseled her on the importance of following up with outpatient medical providers including her primary care physician, cardiology, and infectious disease.  Objective:  Vital signs in last 24 hours: Vitals:   05/18/19 1725 05/18/19 2145 05/18/19 2342 05/19/19 0605  BP: (!) 137/111 113/73 116/76 124/84  Pulse: 93 84 83 95  Resp: 18 17 15 15   Temp: 98.2 F (36.8 C)  97.9 F (36.6 C) 98.4 F (36.9 C)  TempSrc: Oral  Oral Oral  SpO2: 100% 100% 97% 95%  Weight:    49.2 kg    Physical Exam: Physical Exam  Constitutional: She is oriented to person, place, and time and well-developed, well-nourished, and in no distress.  HENT:  Head: Normocephalic and atraumatic.  Eyes: Pupils are equal, round, and reactive to light. Conjunctivae and EOM are normal.  Cardiovascular: Normal rate, regular rhythm, normal heart sounds and intact distal pulses. Exam reveals no gallop and no friction rub.  No murmur heard. Pulmonary/Chest: Effort normal. No respiratory distress.  Abdominal: Soft. She exhibits no distension.  Musculoskeletal:        General: No tenderness or edema. Normal range of motion.     Cervical back: Normal range of motion.  Neurological: She is oriented to person, place, and time. No cranial nerve deficit. Gait normal. Coordination normal.  Hints exam was within normal limits  Skin: Skin is warm and dry.    Filed Weights   05/19/19 0605  Weight: 49.2 kg     Intake/Output Summary (Last 24 hours) at 05/19/2019 0752 Last data filed at 05/18/2019 1800 Gross per 24 hour  Intake 100.69 ml  Output --  Net 100.69 ml    Pertinent labs/Imaging: CBC Latest Ref Rng &  Units 05/19/2019 05/18/2019 10/01/2018  WBC 4.0 - 10.5 K/uL 5.9 6.0 7.5  Hemoglobin 12.0 - 15.0 g/dL 12.5 12.1 13.8  Hematocrit 36.0 - 46.0 % 37.4 37.3 41.6  Platelets 150 - 400 K/uL 196 185 195    CMP Latest Ref Rng & Units 05/19/2019 05/18/2019 10/21/2018  Glucose 70 - 99 mg/dL 85 84 -  BUN 6 - 20 mg/dL 6 <5(L) -  Creatinine 0.44 - 1.00 mg/dL 1.18(H) 0.94 -  Sodium 135 - 145 mmol/L 138 133(L) -  Potassium 3.5 - 5.1 mmol/L 4.0 3.4(L) -  Chloride 98 - 111 mmol/L 107 103 -  CO2 22 - 32 mmol/L 21(L) 20(L) -  Calcium 8.9 - 10.3 mg/dL 8.9 8.8(L) -  Total Protein 6.5 - 8.1 g/dL - 8.4(H) -  Total Bilirubin 0.3 - 1.2 mg/dL - 1.1 -  Alkaline Phos 38 - 126 U/L - 67 -  AST 15 - 41 U/L - 70(H) -  ALT 0 - 44 U/L - 32 34(H)    CT Head:  1. No acute intracranial hemorrhage.  2. Age-related atrophy and chronic microvascular ischemic changes.  Echocardiogram: 1. Left ventricular ejection fraction, by estimation, is 25 to 30%. The  left ventricle has severely decreased function. The left ventrical  demonstrates global hypokinesis. Left ventricular diastolic parameters are  consistent with Grade II diastolic  dysfunction (pseudonormalization). Elevated left ventricular end-diastolic  pressure.  2. Right ventricular systolic function is mildly reduced.  The right  ventricular size is mildly enlarged. Tricuspid regurgitation signal is  inadequate for assessing PA pressure.  3. Mild to moderate mitral valve regurgitation. There is moderate  thickening of the mitral valve leaflet(s). There is mild calcification of  the mitral valve leaflet(s extending into the subvalvular apparatus.  4. The aortic valve is tricuspid. Aortic valve regurgitation is not  visualized. Mild to moderate aortic valve sclerosis/calcification is  present, without any evidence of aortic stenosis.  5. The inferior vena cava is normal in size with <50% respiratory  variability, suggesting right atrial pressure of 8 mmHg.    Assessment/Plan:  Active Problems:   Dizziness   Pre-syncope   Urinary tract infection   Patient Summary: Alexandria Buck is a 59 y.o. with pertinent PMH of HTN, bipolar, HCV, admit for complex migraine on hospital day 1  Complex Migraine: Patient's history and physical exam are consistent with complex migraine.  Patient symptoms are fully resolved at this time.  She denies any lightheadedness, nausea, vomiting.  Her gait was evaluated today and she was found to have no gait instability or difficulties with ambulation. -Patient was instructed to follow-up with her primary care doctor for further evaluation and management  #UTI: She finished 1 day of IV antibiotic therapy with ceftriaxone.  Her culture grew out Klebsiella.  We are waiting on specificities.  She will be discharged on a 5-day course of Keflex 500 mg twice daily.  I will call her to make any adjustments to her antibiotic therapy depending on the specificity results. - Specificities pending. - Discharge patient 5-day course of Keflex 100 mg twice daily  HTN: Patient was previously taking hydrochlorothiazide for hypertension.  - Start lisinopril 10 mg daily  HFrEF (EF of 25-30%):  Patient's echocardiogram shows an ejection fraction of 25 to 30% with global hypokinesia of the left ventricle and grade 2 diastolic dysfunction.  There is elevated left ventricular end-diastolic pressures.  Right ventricular systolic function is mildly reduced there is mild to moderate mitral valve regurgitation there is mild to moderate aortic valve sclerosis/calcification.  This is consistent with the patient's symptoms of orthopnea and dyspnea with exertion.  She will need close follow-up with cardiology in the outpatient setting. - Started on lisinopril 10 mg daily and metoprolol 12.5 mg daily  HCV: -Patient will need follow-up with infectious disease to restart medication for HCV.  Diet: heart healthy IVF: none VTE: enoxaparin  Code:  full PT/OT recs: n/a TOC recs: n/a   Dispo: Anticipated discharge today.    Marianna Payment, D.O. [facility], PGY-1 Date 05/19/2019 Time 7:52 AM

## 2019-05-19 NOTE — Evaluation (Signed)
Occupational Therapy Evaluation and Discharge Summary Patient Details Name: Alexandria Buck MRN: [id number] DOB: [birth year] Today's Date: 05/19/2019    History of Present Illness Pt is a 43 female admitted with 2 day h/o n/v/dizziness esp when laying down.  Pt with h/o Hep C, back pain, bipolar.  EKG showed L atrial enlargement.  Pt with UTI.     Clinical Impression   Pt admitted with the above diagnosis and overall appears to be close to baseline functionally. Pt reported no dizziness but just feels weak from being sick for two days. Pt was fully independent with all adls in room and with a short walk down the hallway.  No further acute care needs at this time.     Follow Up Recommendations  No OT follow up;Supervision - Intermittent    Equipment Recommendations  None recommended by OT    Recommendations for Other Services       Precautions / Restrictions Precautions Precautions: None Restrictions Weight Bearing Restrictions: No      Mobility Bed Mobility Overal bed mobility: Independent             General bed mobility comments: no assist needed  Transfers Overall transfer level: Independent Equipment used: None             General transfer comment: No physical assist needed.     Balance Overall balance assessment: No apparent balance deficits (not formally assessed)                                         ADL either performed or assessed with clinical judgement   ADL Overall ADL's : At baseline                                       General ADL Comments: Pt appears to be at baseline with basic adls. Pt stated she feels a bit weaker than normal but was steady on feet without dizziness this am.      Vision Baseline Vision/History: No visual deficits;Wears glasses Wears Glasses: Reading only Patient Visual Report: No change from baseline Vision Assessment?: No apparent visual deficits     Perception  Perception Perception Tested?: Yes   Praxis Praxis Praxis tested?: Within functional limits    Pertinent Vitals/Pain Pain Assessment: No/denies pain     Hand Dominance Right   Extremity/Trunk Assessment Upper Extremity Assessment Upper Extremity Assessment: Overall WFL for tasks assessed   Lower Extremity Assessment Lower Extremity Assessment: Overall WFL for tasks assessed   Cervical / Trunk Assessment Cervical / Trunk Assessment: Normal   Communication Communication Communication: No difficulties   Cognition Arousal/Alertness: Awake/alert Behavior During Therapy: WFL for tasks assessed/performed Overall Cognitive Status: Within Functional Limits for tasks assessed                                 General Comments: Pt appears to be cognitively intact.   General Comments  Pt appears to be feeling better this am with no complaints of dizziness. Pt states she is walking a bit slower than normal but just feels weak from being sick the last 48 hours.    Exercises     Shoulder Instructions      Home Living Family/patient expects to be discharged  to:: Private residence Living Arrangements: Spouse/significant other Available Help at Discharge: Available 24 hours/day;Family Type of Home: House Home Access: Stairs to enter [employer] of Steps: 6 Entrance Stairs-Rails: Left;Right Home Layout: One level     Bathroom Shower/Tub: Walk-in shower;Tub/shower unit   Bathroom Toilet: Standard     Home Equipment: None          Prior Functioning/Environment Level of Independence: Independent        Comments: pt drives at times but husband does most of driving        OT Problem List:        OT Treatment/Interventions:      OT Goals(Current goals can be found in the care plan section) Acute Rehab OT Goals Patient Stated Goal: to go home OT Goal Formulation: All assessment and education complete, DC therapy  OT Frequency:     Barriers  to D/C:            Co-evaluation              AM-PAC OT "6 Clicks" Daily Activity     Outcome Measure Help from another person eating meals?: None Help from another person taking care of personal grooming?: None Help from another person toileting, which includes using toliet, bedpan, or urinal?: None Help from another person bathing (including washing, rinsing, drying)?: None Help from another person to put on and taking off regular upper body clothing?: None Help from another person to put on and taking off regular lower body clothing?: None 6 Click Score: 24   End of Session Nurse Communication: Mobility status  Activity Tolerance: Patient tolerated treatment well Patient left: in bed;with call bell/phone within reach  OT Visit Diagnosis: Unsteadiness on feet (R26.81)                Time: [id number] OT Time Calculation (min): 9 min Charges:  OT General Charges $OT Visit: 1 Visit OT Evaluation $OT Eval Low Complexity: 1 Low  Glenford Peers 05/19/2019, 10:55 AM

## 2019-05-20 ENCOUNTER — Telehealth: Payer: Self-pay

## 2019-05-20 ENCOUNTER — Telehealth: Payer: Self-pay | Admitting: Internal Medicine

## 2019-05-20 LAB — URINE CULTURE: Culture: >=100000 — AB

## 2019-05-20 MED ORDER — CIPROFLOXACIN HCL 500 MG PO TABS: 500.0000 mg | ORAL_TABLET | Freq: Two times a day (BID) | ORAL | 0 refills | Status: DC

## 2019-05-20 NOTE — Telephone Encounter (Signed)
Phone call placed to patient today.  Advised patient that I received the results of the urine culture that was done on her recent hospitalization.  It grew a bacteria called Klebsiella.  Patient was discharged on Keflex.  The sensitivity to Keflex is not as good as it is to ceftriaxone on Cipro.  Advised that patient stop the Keflex and I will placed on Cipro instead.  Patient requested that the antibiotics be sent to [facility] on [address].  I will have our scheduler touch base with her to schedule appointment for hospital follow-up.  Patient expressed understanding of the plan.  She will pick up the antibiotics today.

## 2019-05-20 NOTE — Telephone Encounter (Signed)
Transition Care Management Follow-up Telephone Call  Date of discharge and from where: 05/19/2019, [facility]   How have you been since you were released from the hospital? She said she is feeling okay  Any questions or concerns? No questions or concerns at this time.  Has no insurance.  She explained that they were filling out papers for medicaid while she was in the hospital. Explained that she will need to call the financial counselor at the hospital to check on the status of the application.   Items Reviewed:  Did the pt receive and understand the discharge instructions provided? Yes.  She did not have any questions.   Medications obtained and verified? She said that she has all medications except the new antibiotic  that Dr Wynetta Emery just ordered.  She did not have any questions or want to review the medication list and understands that there are medications that she is to stop taking.   Any new allergies since your discharge? None reported   Do you have support at home? Yes, she said that she has a friend  Other (ie: DME, [place], etc) no home health or DME ordered  Functional Questionnaire: (I = Independent and D = Dependent) ADL's: indpendent  Follow up appointments reviewed:    PCP Hospital f/u appt confirmed? Appointment with Dr Chapman Fitch - 06/04/2019 @ [facility] f/u appt confirmed? None scheduled at this time  Are transportation arrangements needed?no, she said that her brother drives  If their condition worsens, is the pt aware to call  their PCP or go to the ED? yes  Was the patient provided with contact information for the PCP's office or ED?she has the phone number for the clinic  Was the pt encouraged to call back with questions or concerns?yes

## 2019-06-04 ENCOUNTER — Ambulatory Visit: Payer: Self-pay | Attending: Internal Medicine | Admitting: Internal Medicine

## 2019-06-04 ENCOUNTER — Other Ambulatory Visit: Payer: Self-pay

## 2019-06-04 ENCOUNTER — Encounter: Payer: Self-pay | Admitting: Internal Medicine

## 2019-06-04 VITALS — BP 144/101 | HR 87 | Temp 97.8°F | Resp 16 | Wt 109.2 lb

## 2019-06-04 DIAGNOSIS — B182 Chronic viral hepatitis C: Secondary | ICD-10-CM

## 2019-06-04 DIAGNOSIS — Z09 Encounter for follow-up examination after completed treatment for conditions other than malignant neoplasm: Secondary | ICD-10-CM

## 2019-06-04 DIAGNOSIS — B9689 Other specified bacterial agents as the cause of diseases classified elsewhere: Secondary | ICD-10-CM

## 2019-06-04 DIAGNOSIS — N390 Urinary tract infection, site not specified: Secondary | ICD-10-CM

## 2019-06-04 DIAGNOSIS — I429 Cardiomyopathy, unspecified: Secondary | ICD-10-CM

## 2019-06-04 DIAGNOSIS — I5020 Unspecified systolic (congestive) heart failure: Secondary | ICD-10-CM

## 2019-06-04 MED ORDER — METOPROLOL SUCCINATE ER 25 MG PO TB24: 12.5000 mg | ORAL_TABLET | Freq: Every day | ORAL | 3 refills | Status: DC

## 2019-06-04 MED ORDER — LISINOPRIL 10 MG PO TABS: 10.0000 mg | ORAL_TABLET | Freq: Every day | ORAL | 3 refills | Status: DC
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
## 2019-06-04 NOTE — Patient Instructions (Signed)
I have referred you to the cardiologist and the infectious disease specialist.  Try to limit salt in your foods as much as possible.  Please bring your medications with you on all your office visits.Marland Kitchen

## 2019-06-11 ENCOUNTER — Other Ambulatory Visit: Payer: Self-pay

## 2019-06-11 ENCOUNTER — Ambulatory Visit (INDEPENDENT_AMBULATORY_CARE_PROVIDER_SITE_OTHER): Payer: Self-pay | Admitting: Family

## 2019-06-11 ENCOUNTER — Encounter: Payer: Self-pay | Admitting: Family

## 2019-06-11 VITALS — BP 138/95 | HR 102 | Temp 97.9°F | Ht 62.0 in | Wt 109.0 lb

## 2019-06-11 DIAGNOSIS — B182 Chronic viral hepatitis C: Secondary | ICD-10-CM
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
## 2019-06-11 NOTE — Patient Instructions (Signed)
Nice to see you.  We will check your blood work today.  Depending on results we will get you set up with an additional treatment.   Plan for follow up pending blood work results.

## 2019-06-11 NOTE — Assessment & Plan Note (Addendum)
Ms. Sayres is a 59 y/o female with Genotype 1a Hepatitis C with fibrosis score of F2. She has had less than optimal adherence with her Epclusa with concern for development of resistance based on the descriptions of how she was taking the medication. Will check blood work today including for resistance. May need to consider Vosevi or Harvoni depending on blood work results.Follow up pending blood work.
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
## 2019-06-22 ENCOUNTER — Telehealth: Payer: Self-pay | Admitting: Licensed Clinical Social Worker

## 2019-06-22 ENCOUNTER — Other Ambulatory Visit: Payer: Self-pay

## 2019-06-22 ENCOUNTER — Ambulatory Visit (INDEPENDENT_AMBULATORY_CARE_PROVIDER_SITE_OTHER): Payer: Self-pay | Admitting: Cardiology

## 2019-06-22 ENCOUNTER — Other Ambulatory Visit (HOSPITAL_COMMUNITY)
Admission: RE | Admit: 2019-06-22 | Discharge: 2019-06-22 | Disposition: A | Discharge status: Home / Self Care | Payer: Self-pay | Source: Ambulatory Visit | Attending: Cardiovascular Disease | Admitting: Cardiovascular Disease

## 2019-06-22 ENCOUNTER — Encounter: Payer: Self-pay | Admitting: Cardiology

## 2019-06-22 VITALS — BP 116/80 | HR 68 | Temp 97.0°F | Resp 15 | Ht 61.0 in | Wt 110.0 lb

## 2019-06-22 DIAGNOSIS — R079 Chest pain, unspecified: Secondary | ICD-10-CM

## 2019-06-22 DIAGNOSIS — R002 Palpitations: Secondary | ICD-10-CM

## 2019-06-22 DIAGNOSIS — Z01812 Encounter for preprocedural laboratory examination: Secondary | ICD-10-CM | POA: Insufficient documentation

## 2019-06-22 DIAGNOSIS — Z720 Tobacco use: Secondary | ICD-10-CM

## 2019-06-22 DIAGNOSIS — I5020 Unspecified systolic (congestive) heart failure: Secondary | ICD-10-CM

## 2019-06-22 DIAGNOSIS — Z20822 Contact with and (suspected) exposure to covid-19: Secondary | ICD-10-CM | POA: Insufficient documentation

## 2019-06-22 DIAGNOSIS — I429 Cardiomyopathy, unspecified: Secondary | ICD-10-CM

## 2019-06-22 LAB — LIVER FIBROSIS, FIBROTEST-ACTITEST
ALT: 31 U/L — ABNORMAL HIGH (ref 6–29)
Alpha-2-Macroglobulin: 242 mg/dL (ref 106–279)
Apolipoprotein A1: 88 mg/dL — ABNORMAL LOW (ref 101–198)
Bilirubin: 0.5 mg/dL (ref 0.2–1.2)
Fibrosis Score: 0.64
GGT: 98 U/L — ABNORMAL HIGH (ref 3–65)
Haptoglobin: 84 mg/dL (ref 43–212)
Necroinflammat ACT Score: 0.23
Reference ID: 3297725

## 2019-06-22 LAB — CBC
HCT: 39.4 % (ref 35.0–45.0)
Hemoglobin: 12.8 g/dL (ref 11.7–15.5)
MCH: 29.4 pg (ref 27.0–33.0)
MCHC: 32.5 g/dL (ref 32.0–36.0)
MCV: 90.6 fL (ref 80.0–100.0)
MPV: 11.9 fL (ref 7.5–12.5)
Platelets: 179 10*3/uL (ref 140–400)
RBC: 4.35 10*6/uL (ref 3.80–5.10)
RDW: 12.2 % (ref 11.0–15.0)
WBC: 4.8 10*3/uL (ref 3.8–10.8)

## 2019-06-22 LAB — HEPATIC FUNCTION PANEL
AG Ratio: 0.7 (calc) — ABNORMAL LOW (ref 1.0–2.5)
ALT: 31 U/L — ABNORMAL HIGH (ref 6–29)
AST: 74 U/L — ABNORMAL HIGH (ref 10–35)
Albumin: 3.5 g/dL — ABNORMAL LOW (ref 3.6–5.1)
Alkaline phosphatase (APISO): 61 U/L (ref 37–153)
Bilirubin, Direct: 0.3 mg/dL — ABNORMAL HIGH (ref 0.0–0.2)
Globulin: 4.9 g/dL (calc) — ABNORMAL HIGH (ref 1.9–3.7)
Indirect Bilirubin: 0.4 mg/dL (calc) (ref 0.2–1.2)
Total Bilirubin: 0.7 mg/dL (ref 0.2–1.2)
Total Protein: 8.4 g/dL — ABNORMAL HIGH (ref 6.1–8.1)

## 2019-06-22 LAB — HEPATITIS C RNA QUANTITATIVE
HCV Quantitative Log: 6.09 Log IU/mL — ABNORMAL HIGH
HCV RNA, PCR, QN: 1220000 IU/mL — ABNORMAL HIGH

## 2019-06-22 LAB — HCV VIRAL RNA GEN3 NS5A DRUG RESIST: HCV NS5a Subtype: NOT DETECTED

## 2019-06-22 LAB — SARS CORONAVIRUS 2 (TAT 6-24 HRS): SARS Coronavirus 2: NEGATIVE

## 2019-06-22 MED ORDER — NITROGLYCERIN 0.4 MG SL SUBL: 0.4000 mg | SUBLINGUAL_TABLET | SUBLINGUAL | 3 refills | Status: DC | PRN

## 2019-06-22 MED ORDER — METOPROLOL SUCCINATE ER 25 MG PO TB24: 25.0000 mg | ORAL_TABLET | Freq: Every day | ORAL | 3 refills | Status: DC

## 2019-06-22 NOTE — Telephone Encounter (Signed)
CSW received request to assist with transportation to test and return trip home. CSW made arrangements with taxi company to transport patient. [name], [place], [place]

## 2019-06-22 NOTE — Patient Instructions (Addendum)
Medication Instructions:  INCREASE metoprolol succinate (Toprol XL) to 25 mg daily  *If you need a refill on your cardiac medications before your next appointment, please call your pharmacy*   Lab Work: Today (BMET, CBC, BNP)  COVID TEST TODAY at 10:40 [place]  If you have labs (blood work) drawn today and your tests are completely normal, you will receive your results only by: Marland Kitchen MyChart Message (if you have MyChart) OR . A paper copy in the mail If you have any lab test that is abnormal or we need to change your treatment, we will call you to review the results.   Testing/Procedures: Your physician has requested that you have a cardiac catheterization. Cardiac catheterization is used to diagnose and/or treat various heart conditions. Doctors may recommend this procedure for a number of different reasons. The most common reason is to evaluate chest pain. Chest pain can be a symptom of coronary artery disease (CAD), and cardiac catheterization can show whether plaque is narrowing or blocking your heart's arteries. This procedure is also used to evaluate the valves, as well as measure the blood flow and oxygen levels in different parts of your heart. For further information please visit HugeFiesta.tn. Please follow instruction sheet, as given.   ZIO XT- Long Term Monitor Instructions   Your physician has requested you wear your ZIO patch monitor 3 days.   This is a single patch monitor.  Ir[employer] supplies one patch monitor per enrollment.  Additional stickers are not available.   Please do not apply patch if you will be having a Nuclear Stress Test, Echocardiogram, Cardiac CT, MRI, or Chest Xray during the time frame you would be wearing the monitor. The patch cannot be worn during these tests.  You cannot remove and re-apply the ZIO XT patch monitor.   Your ZIO patch monitor will be sent USPS Priority mail from [facility] directly to your home address. The monitor  may also be mailed to a PO BOX if home delivery is not available.   It may take 3-5 days to receive your monitor after you have been enrolled.   Once you have received you monitor, please review enclosed instructions.  Your monitor has already been registered assigning a specific monitor serial # to you.   Applying the monitor   Shave hair from upper left chest.   Hold abrader disc by orange tab.  Rub abrader in 40 strokes over left upper chest as indicated in your monitor instructions.   Clean area with 4 enclosed alcohol pads .  Use all pads to assure are is cleaned thoroughly.  Let dry.   Apply patch as indicated in monitor instructions.  Patch will be place under collarbone on left side of chest with arrow pointing upward.   Rub patch adhesive wings for 2 minutes.Remove white label marked "1".  Remove white label marked "2".  Rub patch adhesive wings for 2 additional minutes.   While looking in a mirror, press and release button in center of patch.  A small green light will flash 3-4 times .  This will be your only indicator the monitor has been turned on.     Do not shower for the first 24 hours.  You may shower after the first 24 hours.   Press button if you feel a symptom. You will hear a small click.  Record Date, Time and Symptom in the Patient Log Book.   When you are ready to remove patch, follow instructions on last  2 pages of Patient Log Book.  Stick patch monitor onto last page of Patient Log Book.   Place Patient Log Book in [place] box.  Use locking tab on box and tape box closed securely.  The [employer] has [employer] on it.  Please place in mailbox as soon as possible.  Your physician should have your test results approximately 7 days after the monitor has been mailed back to [facility].   Call [place] at [phone] if you have questions regarding your ZIO XT patch monitor.  Call them immediately if you see an orange light blinking  on your monitor.   If your monitor falls off in less than 4 days contact our Monitor department at [phone].  If your monitor becomes loose or falls off after 4 days call [employer] at [phone] for suggestions on securing your monitor.   Follow-Up: At [facility], you and your health needs are our priority.  As part of our continuing mission to provide you with exceptional heart care, we have created designated Provider Care Teams.  These Care Teams include your primary Cardiologist (physician) and Advanced Practice Providers (APPs -  Physician Assistants and Nurse Practitioners) who all work together to provide you with the care you need, when you need it.  We recommend signing up for the patient portal called "MyChart".  Sign up information is provided on this After Visit Summary.  MyChart is used to connect with patients for Virtual Visits (Telemedicine).  Patients are able to view lab/test results, encounter notes, upcoming appointments, etc.  Non-urgent messages can be sent to your provider as well.   To learn more about what you can do with MyChart, go to [email or web address].    Your next appointment:   2 week(s)  The format for your next appointment:   In Person  Provider:   Oswaldo Milian, MD

## 2019-06-23 ENCOUNTER — Telehealth: Payer: Self-pay | Admitting: Radiology

## 2019-06-23 LAB — BASIC METABOLIC PANEL
BUN/Creatinine Ratio: 6 — ABNORMAL LOW (ref 12–28)
BUN: 6 mg/dL — ABNORMAL LOW (ref 8–27)
CO2: 17 mmol/L — ABNORMAL LOW (ref 20–29)
Calcium: 8.9 mg/dL (ref 8.7–10.3)
Chloride: 102 mmol/L (ref 96–106)
Creatinine, Ser: 0.97 mg/dL (ref 0.57–1.00)
GFR calc Af Amer: 73 mL/min/{1.73_m2} (ref >59)
GFR calc non Af Amer: 64 mL/min/{1.73_m2} (ref >59)
Glucose: 73 mg/dL (ref 65–99)
Potassium: 4.5 mmol/L (ref 3.5–5.2)
Sodium: 136 mmol/L (ref 134–144)

## 2019-06-23 LAB — CBC
Hematocrit: 40.4 % (ref 34.0–46.6)
Hemoglobin: 13.6 g/dL (ref 11.1–15.9)
MCH: 29.8 pg (ref 26.6–33.0)
MCHC: 33.7 g/dL (ref 31.5–35.7)
MCV: 89 fL (ref 79–97)
Platelets: 209 10*3/uL (ref 150–450)
RBC: 4.56 x10E6/uL (ref 3.77–5.28)
RDW: 12.2 % (ref 11.7–15.4)
WBC: 5.5 10*3/uL (ref 3.4–10.8)

## 2019-06-23 LAB — BRAIN NATRIURETIC PEPTIDE: BNP: 1532.7 pg/mL — ABNORMAL HIGH (ref 0.0–100.0)

## 2019-06-23 NOTE — Telephone Encounter (Signed)
Enrolled patient for a 3 day Zio monitor to be mailed to patients home. Brief instructions were gone over with the patient and she knows to expect the monitor to arrive in 3-5 day.   Patient is currently selfpay and she was instructed to call [employer] about hardship payment options.

## 2019-06-24 ENCOUNTER — Telehealth: Payer: Self-pay | Admitting: Licensed Clinical Social Worker

## 2019-06-24 ENCOUNTER — Telehealth: Payer: Self-pay | Admitting: *Deleted

## 2019-06-24 NOTE — Telephone Encounter (Signed)
CSW returned call to patient to confirm transport arrangements tomorrow with [employer]. Patient shared that she has no medications at home as she can't afford the co-pays of $65.74. CSW will access the Patient Care Fund to assist with obtaining her medications. Patient grateful for the support and will go to [facility] later today to pick up her medications. CSW will follow up tomorrow in the Cath lab. [name], [place], [place]

## 2019-06-24 NOTE — Telephone Encounter (Signed)
Voicemail message, no answer. 

## 2019-06-24 NOTE — Telephone Encounter (Addendum)
Pt contacted pre-catheterization scheduled at [facility] for: Thursday June 25, 2019 10:30 AM Verified arrival time and place: [facility]) at: 8:30 AM    No solid food after midnight prior to cath, clear liquids until 5 AM day of procedure.   AM meds can be  taken pre-cath with sip of water including: ASA 81 mg   Confirmed patient has responsible adult to drive home post procedure and observe 24 hours after arriving home: yes [name], LCSW will arrange transportation to and from hospital for procedure 06/25/19, pt does have responsible adult to observe at home.  Currently, due to Covid-19 pandemic, only one person will be allowed with patient. Must be the same person for patient's entire stay and will be required to wear a mask. They will be asked to wait in the waiting room for the duration of the patient's stay.  Patients are required to wear a mask when they enter the hospital.      COVID-19 Pre-Screening Questions:  . In the past 7 to 10 days have you had a cough,  shortness of breath, headache, congestion, fever (100 or greater) body aches, chills, sore throat, or sudden loss of taste or sense of smell? no . Have you been around anyone with known Covid 19 in the past 7-10 days? no . Have you been around anyone who is awaiting Covid 19 test results in the past 7 to 10 days? no . Have you been around anyone who has been exposed to Covid 19, or has mentioned symptoms of Covid 19 within the past 7 to 10 days? no  I reviewed procedure/mask/visitor instructions, COVID-19 screening questions with patient, she verbalized understanding, thanked me for call.

## 2019-06-24 NOTE — Progress Notes (Signed)
CSW contacted patient due to request for transportation for cath tomorrow morning. Patient reports she lives alone but her boyfriend comes over daily and will be available at her home to stay with her post procedure. Patient states he is unable to bring her to the appointment. Patient also mentioned she is uninsured and has been unable to get her medications. CSW will explore options for medications and follow up with Medicaid application. CSW made arrangements with [employer] to assist with transport needs for tomorrow and will continue to work with patient on additional SDOH needs. [name], [place], [place]

## 2019-06-25 ENCOUNTER — Encounter (HOSPITAL_COMMUNITY)
Admission: EM | Disposition: A | Discharge status: Home / Self Care | Payer: Self-pay | Source: Home / Self Care | Attending: Cardiovascular Disease

## 2019-06-25 ENCOUNTER — Other Ambulatory Visit: Payer: Self-pay

## 2019-06-25 ENCOUNTER — Emergency Department (HOSPITAL_COMMUNITY): Payer: Self-pay

## 2019-06-25 ENCOUNTER — Inpatient Hospital Stay (HOSPITAL_COMMUNITY)
Admission: EM | Admit: 2019-06-25 | Discharge: 2019-06-27 | DRG: 287 | Disposition: A | Discharge status: Home / Self Care | Payer: Self-pay | Attending: Cardiovascular Disease | Admitting: Cardiovascular Disease

## 2019-06-25 ENCOUNTER — Ambulatory Visit (HOSPITAL_COMMUNITY): Admission: RE | Admit: 2019-06-25 | Payer: Self-pay | Source: Home / Self Care | Admitting: Cardiovascular Disease

## 2019-06-25 ENCOUNTER — Encounter (HOSPITAL_COMMUNITY): Payer: Self-pay

## 2019-06-25 ENCOUNTER — Telehealth: Payer: Self-pay | Admitting: Licensed Clinical Social Worker

## 2019-06-25 DIAGNOSIS — I10 Essential (primary) hypertension: Secondary | ICD-10-CM

## 2019-06-25 DIAGNOSIS — I340 Nonrheumatic mitral (valve) insufficiency: Secondary | ICD-10-CM

## 2019-06-25 DIAGNOSIS — F319 Bipolar disorder, unspecified: Secondary | ICD-10-CM | POA: Diagnosis present

## 2019-06-25 DIAGNOSIS — I428 Other cardiomyopathies: Secondary | ICD-10-CM

## 2019-06-25 DIAGNOSIS — F17210 Nicotine dependence, cigarettes, uncomplicated: Secondary | ICD-10-CM | POA: Diagnosis present

## 2019-06-25 DIAGNOSIS — R079 Chest pain, unspecified: Secondary | ICD-10-CM

## 2019-06-25 DIAGNOSIS — I5041 Acute combined systolic (congestive) and diastolic (congestive) heart failure: Secondary | ICD-10-CM

## 2019-06-25 DIAGNOSIS — F17200 Nicotine dependence, unspecified, uncomplicated: Secondary | ICD-10-CM | POA: Diagnosis present

## 2019-06-25 DIAGNOSIS — I5043 Acute on chronic combined systolic (congestive) and diastolic (congestive) heart failure: Secondary | ICD-10-CM | POA: Diagnosis present

## 2019-06-25 DIAGNOSIS — Z20822 Contact with and (suspected) exposure to covid-19: Secondary | ICD-10-CM | POA: Diagnosis present

## 2019-06-25 DIAGNOSIS — R42 Dizziness and giddiness: Secondary | ICD-10-CM

## 2019-06-25 DIAGNOSIS — E876 Hypokalemia: Secondary | ICD-10-CM

## 2019-06-25 DIAGNOSIS — I110 Hypertensive heart disease with heart failure: Principal | ICD-10-CM | POA: Diagnosis present

## 2019-06-25 DIAGNOSIS — Z79899 Other long term (current) drug therapy: Secondary | ICD-10-CM

## 2019-06-25 DIAGNOSIS — I5021 Acute systolic (congestive) heart failure: Secondary | ICD-10-CM | POA: Diagnosis present

## 2019-06-25 HISTORY — PX: RIGHT/LEFT HEART CATH AND CORONARY ANGIOGRAPHY: CATH118266

## 2019-06-25 HISTORY — PX: CARDIAC CATHETERIZATION: SHX172

## 2019-06-25 HISTORY — DX: Other cardiomyopathies: I42.8

## 2019-06-25 HISTORY — DX: Acute systolic (congestive) heart failure: I50.21

## 2019-06-25 LAB — POCT I-STAT 7, (LYTES, BLD GAS, ICA,H+H)
Acid-base deficit: 5 mmol/L — ABNORMAL HIGH (ref 0.0–2.0)
Acid-base deficit: 6 mmol/L — ABNORMAL HIGH (ref 0.0–2.0)
Bicarbonate: 17.9 mmol/L — ABNORMAL LOW (ref 20.0–28.0)
Bicarbonate: 19.7 mmol/L — ABNORMAL LOW (ref 20.0–28.0)
Calcium, Ion: 1.07 mmol/L — ABNORMAL LOW (ref 1.15–1.40)
Calcium, Ion: 1.08 mmol/L — ABNORMAL LOW (ref 1.15–1.40)
HCT: 34 % — ABNORMAL LOW (ref 36.0–46.0)
HCT: 35 % — ABNORMAL LOW (ref 36.0–46.0)
Hemoglobin: 11.6 g/dL — ABNORMAL LOW (ref 12.0–15.0)
Hemoglobin: 11.9 g/dL — ABNORMAL LOW (ref 12.0–15.0)
O2 Saturation: 68 %
O2 Saturation: 97 %
Potassium: 3.5 mmol/L (ref 3.5–5.1)
Potassium: 3.6 mmol/L (ref 3.5–5.1)
Sodium: 144 mmol/L (ref 135–145)
Sodium: 144 mmol/L (ref 135–145)
TCO2: 19 mmol/L — ABNORMAL LOW (ref 22–32)
TCO2: 21 mmol/L — ABNORMAL LOW (ref 22–32)
pCO2 arterial: 28.6 mmHg — ABNORMAL LOW (ref 32.0–48.0)
pCO2 arterial: 33.9 mmHg (ref 32.0–48.0)
pH, Arterial: 7.373 (ref 7.350–7.450)
pH, Arterial: 7.405 (ref 7.350–7.450)
pO2, Arterial: 36 mmHg — CL (ref 83.0–108.0)
pO2, Arterial: 88 mmHg (ref 83.0–108.0)

## 2019-06-25 LAB — CBC
HCT: 38.6 % (ref 36.0–46.0)
Hemoglobin: 12.3 g/dL (ref 12.0–15.0)
MCH: 29.6 pg (ref 26.0–34.0)
MCHC: 31.9 g/dL (ref 30.0–36.0)
MCV: 92.8 fL (ref 80.0–100.0)
Platelets: 181 10*3/uL (ref 150–400)
RBC: 4.16 MIL/uL (ref 3.87–5.11)
RDW: 14.1 % (ref 11.5–15.5)
WBC: 5.9 10*3/uL (ref 4.0–10.5)
nRBC: 0 % (ref 0.0–0.2)

## 2019-06-25 LAB — BASIC METABOLIC PANEL
Anion gap: 13 (ref 5–15)
BUN: 7 mg/dL (ref 6–20)
CO2: 17 mmol/L — ABNORMAL LOW (ref 22–32)
Calcium: 8.7 mg/dL — ABNORMAL LOW (ref 8.9–10.3)
Chloride: 106 mmol/L (ref 98–111)
Creatinine, Ser: 1.02 mg/dL — ABNORMAL HIGH (ref 0.44–1.00)
GFR calc Af Amer: >60 mL/min (ref >60)
GFR calc non Af Amer: 60 mL/min — ABNORMAL LOW (ref >60)
Glucose, Bld: 77 mg/dL (ref 70–99)
Potassium: 5.1 mmol/L (ref 3.5–5.1)
Sodium: 136 mmol/L (ref 135–145)

## 2019-06-25 LAB — RESPIRATORY PANEL BY RT PCR (FLU A&B, COVID)
Influenza A by PCR: NEGATIVE
Influenza B by PCR: NEGATIVE
SARS Coronavirus 2 by RT PCR: NEGATIVE

## 2019-06-25 LAB — TROPONIN I (HIGH SENSITIVITY)
Troponin I (High Sensitivity): 17 ng/L (ref <18)
Troponin I (High Sensitivity): 20 ng/L — ABNORMAL HIGH (ref <18)

## 2019-06-25 SURGERY — RIGHT/LEFT HEART CATH AND CORONARY ANGIOGRAPHY
Anesthesia: LOCAL

## 2019-06-25 MED ORDER — LIDOCAINE HCL (PF) 1 % IJ SOLN
INTRAMUSCULAR | Status: AC
  Filled 2019-06-25: qty 30

## 2019-06-25 MED ORDER — FUROSEMIDE 10 MG/ML IJ SOLN
40.0000 mg | Freq: Two times a day (BID) | INTRAMUSCULAR | Status: DC
  Administered 2019-06-25: 40 mg via INTRAVENOUS
  Filled 2019-06-25 (×2): qty 4

## 2019-06-25 MED ORDER — VERAPAMIL HCL 2.5 MG/ML IV SOLN
INTRAVENOUS | Status: AC
  Filled 2019-06-25: qty 2

## 2019-06-25 MED ORDER — SODIUM CHLORIDE 0.9% FLUSH: 3.0000 mL | INTRAVENOUS | Status: DC | PRN

## 2019-06-25 MED ORDER — SODIUM CHLORIDE 0.9% FLUSH
3.0000 mL | Freq: Two times a day (BID) | INTRAVENOUS | Status: DC
  Administered 2019-06-26 (×2): 3 mL via INTRAVENOUS

## 2019-06-25 MED ORDER — METOPROLOL SUCCINATE ER 25 MG PO TB24
25.0000 mg | ORAL_TABLET | Freq: Every day | ORAL | Status: DC
  Administered 2019-06-25: 25 mg via ORAL
  Filled 2019-06-25 (×2): qty 1

## 2019-06-25 MED ORDER — NITROGLYCERIN 0.4 MG SL SUBL
0.4000 mg | SUBLINGUAL_TABLET | SUBLINGUAL | Status: DC | PRN
  Filled 2019-06-25: qty 1

## 2019-06-25 MED ORDER — SODIUM CHLORIDE 0.9 % IV SOLN: INTRAVENOUS | Status: DC

## 2019-06-25 MED ORDER — PAROXETINE HCL 10 MG PO TABS
10.0000 mg | ORAL_TABLET | Freq: Every day | ORAL | Status: DC
  Administered 2019-06-25 – 2019-06-26 (×2): 10 mg via ORAL
  Filled 2019-06-25 (×3): qty 1

## 2019-06-25 MED ORDER — ASPIRIN 81 MG PO CHEW
CHEWABLE_TABLET | ORAL | Status: AC
  Filled 2019-06-25: qty 1

## 2019-06-25 MED ORDER — HEPARIN (PORCINE) IN NACL 1000-0.9 UT/500ML-% IV SOLN
INTRAVENOUS | Status: AC
  Filled 2019-06-25: qty 1000

## 2019-06-25 MED ORDER — MIDAZOLAM HCL 2 MG/2ML IJ SOLN
INTRAMUSCULAR | Status: AC
  Filled 2019-06-25: qty 2

## 2019-06-25 MED ORDER — SODIUM CHLORIDE 0.9 % IV SOLN: INTRAVENOUS | Status: AC

## 2019-06-25 MED ORDER — HEPARIN SODIUM (PORCINE) 1000 UNIT/ML IJ SOLN
INTRAMUSCULAR | Status: AC
  Filled 2019-06-25: qty 1

## 2019-06-25 MED ORDER — HEPARIN (PORCINE) IN NACL 1000-0.9 UT/500ML-% IV SOLN
INTRAVENOUS | Status: DC | PRN
  Administered 2019-06-25 (×2): 500 mL

## 2019-06-25 MED ORDER — HEPARIN SODIUM (PORCINE) 1000 UNIT/ML IJ SOLN
INTRAMUSCULAR | Status: DC | PRN
  Administered 2019-06-25: 3000 [IU] via INTRAVENOUS

## 2019-06-25 MED ORDER — ASPIRIN 81 MG PO CHEW: 81.0000 mg | CHEWABLE_TABLET | ORAL | Status: DC

## 2019-06-25 MED ORDER — LIDOCAINE HCL (PF) 1 % IJ SOLN
INTRAMUSCULAR | Status: DC | PRN
  Administered 2019-06-25 (×2): 2 mL via SUBCUTANEOUS

## 2019-06-25 MED ORDER — SODIUM CHLORIDE 0.9% FLUSH: 3.0000 mL | Freq: Once | INTRAVENOUS | Status: DC

## 2019-06-25 MED ORDER — LITHIUM CARBONATE 150 MG PO CAPS
150.0000 mg | ORAL_CAPSULE | Freq: Two times a day (BID) | ORAL | Status: DC
  Administered 2019-06-25 – 2019-06-27 (×5): 150 mg via ORAL
  Filled 2019-06-25 (×5): qty 1

## 2019-06-25 MED ORDER — ASPIRIN 81 MG PO CHEW
CHEWABLE_TABLET | ORAL | Status: DC | PRN
  Administered 2019-06-25: 324 mg via ORAL

## 2019-06-25 MED ORDER — FENTANYL CITRATE (PF) 100 MCG/2ML IJ SOLN
INTRAMUSCULAR | Status: AC
  Filled 2019-06-25: qty 2

## 2019-06-25 MED ORDER — ACETAMINOPHEN 325 MG PO TABS: 650.0000 mg | ORAL_TABLET | ORAL | Status: DC | PRN

## 2019-06-25 MED ORDER — HYDRALAZINE HCL 20 MG/ML IJ SOLN: 10.0000 mg | INTRAMUSCULAR | Status: AC | PRN

## 2019-06-25 MED ORDER — MIDAZOLAM HCL 2 MG/2ML IJ SOLN
INTRAMUSCULAR | Status: DC | PRN
  Administered 2019-06-25: 2 mg via INTRAVENOUS

## 2019-06-25 MED ORDER — LISINOPRIL 10 MG PO TABS
10.0000 mg | ORAL_TABLET | Freq: Every day | ORAL | Status: DC
  Administered 2019-06-25: 11:00:00 10 mg via ORAL
  Filled 2019-06-25 (×2): qty 1

## 2019-06-25 MED ORDER — ONDANSETRON HCL 4 MG/2ML IJ SOLN: 4.0000 mg | Freq: Four times a day (QID) | INTRAMUSCULAR | Status: DC | PRN

## 2019-06-25 MED ORDER — RISPERIDONE 1 MG PO TABS
1.0000 mg | ORAL_TABLET | Freq: Every day | ORAL | Status: DC
  Administered 2019-06-25 – 2019-06-26 (×2): 1 mg via ORAL
  Filled 2019-06-25 (×3): qty 1

## 2019-06-25 MED ORDER — SODIUM CHLORIDE 0.9 % IV SOLN: 250.0000 mL | INTRAVENOUS | Status: DC | PRN

## 2019-06-25 MED ORDER — VERAPAMIL HCL 2.5 MG/ML IV SOLN
INTRAVENOUS | Status: DC | PRN
  Administered 2019-06-25: 10 mL via INTRA_ARTERIAL

## 2019-06-25 MED ORDER — FUROSEMIDE 10 MG/ML IJ SOLN
INTRAMUSCULAR | Status: AC
  Filled 2019-06-25: qty 4

## 2019-06-25 MED ORDER — FENTANYL CITRATE (PF) 100 MCG/2ML IJ SOLN
INTRAMUSCULAR | Status: DC | PRN
  Administered 2019-06-25: 25 ug via INTRAVENOUS

## 2019-06-25 MED ORDER — LABETALOL HCL 5 MG/ML IV SOLN: 10.0000 mg | INTRAVENOUS | Status: AC | PRN

## 2019-06-25 MED ORDER — SODIUM CHLORIDE 0.9% FLUSH
3.0000 mL | Freq: Two times a day (BID) | INTRAVENOUS | Status: DC
  Administered 2019-06-25 – 2019-06-26 (×2): 3 mL via INTRAVENOUS

## 2019-06-25 MED ORDER — NITROGLYCERIN 0.4 MG SL SUBL: 0.4000 mg | SUBLINGUAL_TABLET | SUBLINGUAL | Status: DC | PRN

## 2019-06-25 MED ORDER — FUROSEMIDE 10 MG/ML IJ SOLN
INTRAMUSCULAR | Status: DC | PRN
  Administered 2019-06-25: 40 mg via INTRAVENOUS

## 2019-06-25 SURGICAL SUPPLY — 12 items
CATH 5FR JL3.5 JR4 ANG PIG MP (CATHETERS) ×2 IMPLANT
CATH BALLN WEDGE 5F 110CM (CATHETERS) ×2 IMPLANT
DEVICE RAD TR BAND REGULAR (VASCULAR PRODUCTS) ×2 IMPLANT
GLIDESHEATH SLEND SS 6F .021 (SHEATH) ×2 IMPLANT
GUIDEWIRE .025 260CM (WIRE) ×2 IMPLANT
GUIDEWIRE INQWIRE 1.5J.035X260 (WIRE) ×1 IMPLANT
INQWIRE 1.5J .035X260CM (WIRE) ×2
KIT HEART LEFT (KITS) ×2 IMPLANT
PACK CARDIAC CATHETERIZATION (CUSTOM PROCEDURE TRAY) ×2 IMPLANT
SHEATH GLIDE SLENDER 4/5FR (SHEATH) ×2 IMPLANT
TRANSDUCER W/STOPCOCK (MISCELLANEOUS) ×2 IMPLANT
TUBING CIL FLEX 10 FLL-RA (TUBING) ×2 IMPLANT

## 2019-06-25 NOTE — Telephone Encounter (Signed)
CSW received call this morning from [facility] stating that patient was in the ED early this morning and to cancel transportation for cardiac cath as she is already in the [place] notified transport and will be available if needed. [name], [place], [place]
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
## 2019-06-25 NOTE — Interval H&P Note (Signed)
History and Physical Interval Note:  06/25/2019 7:33 AM  Alexandria Buck  has presented today for surgery, with the diagnosis of cardiomyopathy.  The various methods of treatment have been discussed with the patient and family. After consideration of risks, benefits and other options for treatment, the patient has consented to  Procedure(s): RIGHT/LEFT HEART CATH AND CORONARY ANGIOGRAPHY (N/A) as a surgical intervention.  The patient's history has been reviewed, patient examined, no change in status, stable for surgery.  I have reviewed the patient's chart and labs.  Questions were answered to the patient's satisfaction.    Cath Lab Visit (complete for each Cath Lab visit)  Clinical Evaluation Leading to the Procedure:   ACS: No.  Non-ACS:    Anginal Classification: CCS III  Anti-ischemic medical therapy: Minimal Therapy (1 class of medications)  Non-Invasive Test Results: No non-invasive testing performed  Prior CABG: No previous CABG        Lauree Chandler
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
## 2019-06-25 NOTE — ED Notes (Signed)
Pt came to the ED via EMS. Pt conscious, breathing, and A&Ox4. Pt brought back to [facility] via stretcher. Pt endorses "I've had chest pain for the past two weeks and I have a scheduled heart procedure today at [facility]". Chest rise and fall equally with non-labored breathing. Lungs clear apex to base. Abd soft and non-tender. Pt denies n/v/d, shortness of breath, and f/c. Pt endorses pain 8 out of 10 pain that's vice-like. PIVC placed on the LAC with a 18G which had positive blood return and flushed without pain or infiltration. Blood collected, labeled, and sent to lab. Bed in lowest position with call light within reach. Pt on continuous blood pressure, pulse ox, and cardiac monitor. Will continue to monitor. Awaiting MD eval. No distress noted.

## 2019-06-25 NOTE — Progress Notes (Incomplete)
Patient complained of feeling dizzy. Has had symptom for around a month off and on. Stated she was admitted for cp and feeling dizzy. BP 117/79, MAP 91 SR 60's, O2 sats 98 RR 16. Lungs clear throughout, No chest pain. Marland Kitchen  ?

## 2019-06-26 ENCOUNTER — Inpatient Hospital Stay (HOSPITAL_COMMUNITY): Payer: Self-pay

## 2019-06-26 DIAGNOSIS — I10 Essential (primary) hypertension: Secondary | ICD-10-CM

## 2019-06-26 DIAGNOSIS — R42 Dizziness and giddiness: Secondary | ICD-10-CM

## 2019-06-26 LAB — BASIC METABOLIC PANEL
Anion gap: 10 (ref 5–15)
BUN: 8 mg/dL (ref 6–20)
CO2: 23 mmol/L (ref 22–32)
Calcium: 8.3 mg/dL — ABNORMAL LOW (ref 8.9–10.3)
Chloride: 104 mmol/L (ref 98–111)
Creatinine, Ser: 0.97 mg/dL (ref 0.44–1.00)
GFR calc Af Amer: >60 mL/min (ref >60)
GFR calc non Af Amer: >60 mL/min (ref >60)
Glucose, Bld: 102 mg/dL — ABNORMAL HIGH (ref 70–99)
Potassium: 3.1 mmol/L — ABNORMAL LOW (ref 3.5–5.1)
Sodium: 137 mmol/L (ref 135–145)

## 2019-06-26 MED ORDER — SPIRONOLACTONE 12.5 MG HALF TABLET
12.5000 mg | ORAL_TABLET | Freq: Every day | ORAL | Status: DC
  Administered 2019-06-27: 12.5 mg via ORAL
  Filled 2019-06-26 (×3): qty 1

## 2019-06-26 MED ORDER — GADOBUTROL 1 MMOL/ML IV SOLN
4.7000 mL | Freq: Once | INTRAVENOUS | Status: AC | PRN
  Administered 2019-06-26: 4.7 mL via INTRAVENOUS

## 2019-06-26 MED ORDER — IOHEXOL 350 MG/ML SOLN
75.0000 mL | Freq: Once | INTRAVENOUS | Status: AC | PRN
  Administered 2019-06-26: 20:00:00 75 mL via INTRAVENOUS

## 2019-06-26 MED ORDER — POTASSIUM CHLORIDE CRYS ER 20 MEQ PO TBCR
40.0000 meq | EXTENDED_RELEASE_TABLET | ORAL | Status: AC
  Administered 2019-06-26: 40 meq via ORAL
  Filled 2019-06-26: qty 2

## 2019-06-26 MED ORDER — MELATONIN 3 MG PO TABS
3.0000 mg | ORAL_TABLET | Freq: Once | ORAL | Status: DC
  Filled 2019-06-26: qty 1

## 2019-06-26 MED ORDER — LOSARTAN POTASSIUM 25 MG PO TABS
25.0000 mg | ORAL_TABLET | Freq: Every day | ORAL | Status: DC
  Administered 2019-06-26 – 2019-06-27 (×2): 25 mg via ORAL
  Filled 2019-06-26 (×2): qty 1

## 2019-06-26 MED ORDER — POTASSIUM CHLORIDE CRYS ER 20 MEQ PO TBCR
40.0000 meq | EXTENDED_RELEASE_TABLET | Freq: Once | ORAL | Status: DC
  Administered 2019-06-26: 40 meq via ORAL
  Filled 2019-06-26: qty 2

## 2019-06-26 MED ORDER — CARVEDILOL 3.125 MG PO TABS
3.1250 mg | ORAL_TABLET | Freq: Two times a day (BID) | ORAL | Status: DC
  Administered 2019-06-26 – 2019-06-27 (×2): 3.125 mg via ORAL
  Filled 2019-06-26 (×2): qty 1

## 2019-06-26 MED ORDER — FUROSEMIDE 40 MG PO TABS
40.0000 mg | ORAL_TABLET | Freq: Every day | ORAL | Status: DC
  Administered 2019-06-26 – 2019-06-27 (×2): 40 mg via ORAL
  Filled 2019-06-26 (×2): qty 1

## 2019-06-26 MED ORDER — POTASSIUM CHLORIDE CRYS ER 20 MEQ PO TBCR: 20.0000 meq | EXTENDED_RELEASE_TABLET | Freq: Every day | ORAL | Status: DC

## 2019-06-26 NOTE — Progress Notes (Signed)
Patient requesting something to help with sleep.  RN spoke with on call for Cardiology, order received.
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
## 2019-06-26 NOTE — Progress Notes (Addendum)
Progress Note  Patient Name: Alexandria Buck Date of Encounter: 06/26/2019  Primary Cardiologist: No primary care provider on file.   Subjective   Admitted with chest pain and normal trop.  Cath showed normal coronary arteries but elevated filling pressures.  Admitted post cath for diuresis.  Feeling better with no further CP or SOB.  She has been having significant episodes of dizziness that occur with positional changes with severe room spinning.      Inpatient Medications    Scheduled Meds: . furosemide  40 mg Intravenous BID  . lisinopril  10 mg Oral Daily  . lithium carbonate  150 mg Oral BID WC  . Melatonin  3 mg Oral Once  . metoprolol succinate  25 mg Oral Daily  . PARoxetine  10 mg Oral QHS  . potassium chloride  40 mEq Oral Once  . risperiDONE  1 mg Oral QHS  . sodium chloride flush  3 mL Intravenous Once  . sodium chloride flush  3 mL Intravenous Q12H  . sodium chloride flush  3 mL Intravenous Q12H   Continuous Infusions: . sodium chloride     PRN Meds: sodium chloride, acetaminophen, nitroGLYCERIN, ondansetron (ZOFRAN) IV, sodium chloride flush   Vital Signs    Vitals:   06/25/19 1501 06/25/19 2001 06/25/19 2300 06/26/19 0454  BP: 112/78 117/80 112/89 113/76  Pulse: 65 68 66 63  Resp: 17 20 15 14   Temp: 98 F (36.7 C) 98.6 F (37 C)  98.2 F (36.8 C)  TempSrc: Oral Oral  Oral  SpO2: 96% 96% 100% 97%  Weight:    47.2 kg    Intake/Output Summary (Last 24 hours) at 06/26/2019 1030 Last data filed at 06/26/2019 0618 Gross per 24 hour  Intake 485.78 ml  Output 2150 ml  Net -1664.22 ml   Filed Weights   06/26/19 0454  Weight: 47.2 kg    Telemetry    NSR - Personally Reviewed  ECG    No new EKG to review - Personally Reviewed  Physical Exam   GEN: No acute distress.   Neck: No JVD Cardiac: RRR, no murmurs, rubs, or gallops.  Respiratory: Clear to auscultation bilaterally. GI: Soft, nontender, non-distended  MS: No edema; No  deformity. Neuro:  Nonfocal  Psych: Normal affect   Labs    Chemistry Recent Labs  Lab 06/22/19 0947 06/22/19 0947 06/25/19 0625 06/25/19 0625 06/25/19 0808 06/25/19 0814 06/26/19 0240  NA 136  --  136   < > 144 144 137  K 4.5   < > 5.1   < > 3.6 3.5 3.1*  CL 102  --  106  --   --   --  104  CO2 17*  --  17*  --   --   --  23  GLUCOSE 73  --  77  --   --   --  102*  BUN 6*  --  7  --   --   --  8  CREATININE 0.97  --  1.02*  --   --   --  0.97  CALCIUM 8.9  --  8.7*  --   --   --  8.3*  GFRNONAA 64  --  60*  --   --   --  >60  GFRAA 73  --  >60  --   --   --  >60  ANIONGAP  --   --  13  --   --   --  10   < > = values in this interval not displayed.     Hematology Recent Labs  Lab 06/22/19 0947 06/25/19 0625 06/25/19 0808 06/25/19 0814  WBC 5.5 5.9  --   --   RBC 4.56 4.16  --   --   HGB 13.6 12.3 11.9* 11.6*  HCT 40.4 38.6 35.0* 34.0*  MCV 89 92.8  --   --   MCH 29.8 29.6  --   --   MCHC 33.7 31.9  --   --   RDW 12.2 14.1  --   --   PLT 209 181  --   --     Cardiac EnzymesNo results for input(s): TROPONINI in the last 168 hours. No results for input(s): TROPIPOC in the last 168 hours.   BNP Recent Labs  Lab 06/22/19 0947  BNP 1,532.7*     DDimer No results for input(s): DDIMER in the last 168 hours.   Radiology    DG Chest 2 View  Result Date: 06/25/2019 CLINICAL DATA:  Chest pain, scheduled for cardiac catheterization EXAM: CHEST - 2 VIEW COMPARISON:  Radiograph 05/18/2019 FINDINGS: Fine reticular opacities are similar to comparisons and likely reflect the paraseptal emphysematous changes seen on comparison CT. Additional bandlike areas of subsegmental atelectasis or scarring noted in the left mid lung. Stable slightly thickened appearance of the right minor fissure. No pneumothorax or effusion. Cardiomegaly is similar to prior. The aorta is calcified. The remaining cardiomediastinal contours are unremarkable. No acute osseous or soft tissue  abnormality. Telemetry leads overlie the chest. IMPRESSION: No acute cardiopulmonary findings. Stable cardiomegaly. Fine reticular opacities compatible with paraseptal emphysematous changes though mild interstitial edema could have a similar appearance. Electronically Signed   By: Lovena Le M.D.   On: 06/25/2019 07:00   CARDIAC CATHETERIZATION  Result Date: 06/25/2019 1. No angiographic evidence of CAD 2. Elevated right heart pressures (RA 14, RV 54/8/14, PA 57/25/37, PCWP 26). Will admit to telemetry and diurese today with IV Lasix. She presented to the ED this am with severe chest pressure and dyspnea which is felt to be due to her volume overload.    Cardiac Studies   Cardiac Cath 06/25/2019 Conclusion  1. No angiographic evidence of CAD 2. Elevated right heart pressures (RA 14, RV 54/8/14, PA 57/25/37, PCWP 26).   Will admit to telemetry and diurese today with IV Lasix. She presented to the ED this am with severe chest pressure and dyspnea which is felt to be due to her volume overload.    2D echo 05/19/2019 IMPRESSIONS   1. Left ventricular ejection fraction, by estimation, is 25 to 30%. The  left ventricle has severely decreased function. The left ventrical  demonstrates global hypokinesis. Left ventricular diastolic parameters are  consistent with Grade II diastolic  dysfunction (pseudonormalization). Elevated left ventricular end-diastolic  pressure.  2. Right ventricular systolic function is mildly reduced. The right  ventricular size is mildly enlarged. Tricuspid regurgitation signal is  inadequate for assessing PA pressure.  3. Mild to moderate mitral valve regurgitation. There is moderate  thickening of the mitral valve leaflet(s). There is mild calcification of  the mitral valve leaflet(s extending into the subvalvular apparatus.  4. The aortic valve is tricuspid. Aortic valve regurgitation is not  visualized. Mild to moderate aortic valve sclerosis/calcification is   present, without any evidence of aortic stenosis.  5. The inferior vena cava is normal in size with <50% respiratory  variability, suggesting right atrial pressure of 8 mmHg.  Patient Profile     59 y.o. female with history of HTN, moderate mitral regurgitation, bipolar disorder, tobacco abuse, Hep C and recent finding of acute CHF and cardiomyopathy who was scheduled for an outpatient cardiac cath today but presented to the ED early this am with worsened chest pain. She was seen this week in our office by Dr. Gardiner Rhyme. Echo with LVEF=25-30% with mild to moderate MR. BNP elevated at the office visit. EKG in the ED this am unchanged. Troponin negative. We moved her cath up to 7:30 am. Her cardiac cath showed normal coronary arteries but elevated right and left heart pressures. She is being admitted for diuresis with IV Lasix.   Assessment & Plan    1.  Chest pain/SOB -cardiac cath yesterday with normal coronary arteries  -elevated filling pressures c/w acute systolic CHF  2.  Acute systolic CHF -recent 2D echo with severe LV dysfunction EF 25-30%>> nonischemic -normal coronary arteries on cath yesterday -started on IV Lasix for elevated filling pressures with LVEDP 51mmHg, PCW mean 61mmHg and RVEDP 52mmHg.  CI 2.48. -she put out 2.4L yesterday and is net neg 1.96L -weight 104 lbs today and was 110lbs in the office 06/22/2019 -creatinine stable post cath at 0.97 -she appears euvolemic on exam -she complains of dizziness but BP is stable -will change Toprol to Carvedilol 3.125mg  BID -change Lisinopril to Losartan 25mg  daily with plans to transition to [facility] as outpt -add spiro 12.5mg  daily -change Lasix to 40mg  daily PO  -once HF meds titrated to maximally tolerated doses, then repeat 2D echo  3.  Hypokalemia -replete today -will send home on [place] 33meq daily -check BMET in 1 week  4.  HTN -BP controlled -see above>>changing Toprol to Carvedilol and Lisinopril to Losartan as  part of HF regimen -adding sprio  5.  Moderate MR -likely related to DCM -hopefully will improve on HF treatment  6.  Dizziness -sx c/w vertigo -this is intermittent but has a hard time laying supine due to head spinning -will ask Neuro to see before discharge  She is stable for discharge today if ok by Neuro.  Will have her followup with Dr. Gardiner Rhyme in 7-10 days and BMET in 1 week. She was instructed to weigh her self daily and call if weight increases > 3lbs in 1 day or 5lbs in 1 week. Instructed on < 2gm Na diet and < 2L fluid daily.   I have spent a total of 40 minutes with patient reviewing cardiac cath, 2D echo , telemetry, EKGs, labs and examining patient as well as establishing an assessment and plan that was discussed with the patient.  > 50% of time was spent in direct patient care.    For questions or updates, please contact [facility] Please consult [email or web address] for contact info under Cardiology/STEMI.      Signed, Fransico Him, MD  06/26/2019, 10:30 AM

## 2019-06-27 ENCOUNTER — Other Ambulatory Visit: Payer: Self-pay | Admitting: Medical

## 2019-06-27 DIAGNOSIS — R42 Dizziness and giddiness: Secondary | ICD-10-CM

## 2019-06-27 DIAGNOSIS — R079 Chest pain, unspecified: Secondary | ICD-10-CM

## 2019-06-27 DIAGNOSIS — I340 Nonrheumatic mitral (valve) insufficiency: Secondary | ICD-10-CM

## 2019-06-27 DIAGNOSIS — E876 Hypokalemia: Secondary | ICD-10-CM

## 2019-06-27 DIAGNOSIS — I5021 Acute systolic (congestive) heart failure: Secondary | ICD-10-CM

## 2019-06-27 LAB — BASIC METABOLIC PANEL
Anion gap: 8 (ref 5–15)
BUN: 8 mg/dL (ref 6–20)
CO2: 22 mmol/L (ref 22–32)
Calcium: 8.7 mg/dL — ABNORMAL LOW (ref 8.9–10.3)
Chloride: 108 mmol/L (ref 98–111)
Creatinine, Ser: 1.07 mg/dL — ABNORMAL HIGH (ref 0.44–1.00)
GFR calc Af Amer: >60 mL/min (ref >60)
GFR calc non Af Amer: 56 mL/min — ABNORMAL LOW (ref >60)
Glucose, Bld: 86 mg/dL (ref 70–99)
Potassium: 4.2 mmol/L (ref 3.5–5.1)
Sodium: 138 mmol/L (ref 135–145)

## 2019-06-27 LAB — CBC
HCT: 37.7 % (ref 36.0–46.0)
Hemoglobin: 12 g/dL (ref 12.0–15.0)
MCH: 29.3 pg (ref 26.0–34.0)
MCHC: 31.8 g/dL (ref 30.0–36.0)
MCV: 92.2 fL (ref 80.0–100.0)
Platelets: 210 10*3/uL (ref 150–400)
RBC: 4.09 MIL/uL (ref 3.87–5.11)
RDW: 14 % (ref 11.5–15.5)
WBC: 5.1 10*3/uL (ref 4.0–10.5)
nRBC: 0 % (ref 0.0–0.2)

## 2019-06-27 LAB — MAGNESIUM: Magnesium: 2 mg/dL (ref 1.7–2.4)

## 2019-06-27 MED ORDER — CARVEDILOL 3.125 MG PO TABS: 3.1250 mg | ORAL_TABLET | Freq: Two times a day (BID) | ORAL | 6 refills | Status: DC

## 2019-06-27 MED ORDER — LOSARTAN POTASSIUM 25 MG PO TABS: 25.0000 mg | ORAL_TABLET | Freq: Every day | ORAL | 6 refills | Status: DC

## 2019-06-27 MED ORDER — SPIRONOLACTONE 25 MG PO TABS: 12.5000 mg | ORAL_TABLET | Freq: Every day | ORAL | 6 refills | Status: DC

## 2019-06-27 MED ORDER — FUROSEMIDE 40 MG PO TABS: 40.0000 mg | ORAL_TABLET | Freq: Every day | ORAL | 6 refills | Status: DC

## 2019-06-27 NOTE — Plan of Care (Signed)
  Problem: Education: Goal: Knowledge of General Education information will improve Description: Including pain rating scale, medication(s)/side effects and non-pharmacologic comfort measures Outcome: Adequate for Discharge   

## 2019-06-27 NOTE — Discharge Summary (Addendum)
Discharge Summary    Patient ID: Alexandria Buck MRN: [id number]; DOB: 1960-05-27  Admit date: 06/25/2019 Discharge date: 06/27/2019  Primary Care Provider: Ladell Pier, MD  Primary Cardiologist: Donato Heinz, MD  Primary Electrophysiologist:  None   Discharge Diagnoses    Principal Problem:   Chest pain of uncertain etiology Active Problems:   Tobacco dependence   Hypertension   NICM (nonischemic cardiomyopathy) ([place])   Acute combined systolic and diastolic heart failure (HCC)   Dizziness   Hypokalemia   Mitral regurgitation   Diagnostic Studies/Procedures    Echo 05/19/19 1. Left ventricular ejection fraction, by estimation, is 25 to 30%. The  left ventricle has severely decreased function. The left ventrical  demonstrates global hypokinesis. Left ventricular diastolic parameters are  consistent with Grade II diastolic  dysfunction (pseudonormalization). Elevated left ventricular end-diastolic  pressure.  2. Right ventricular systolic function is mildly reduced. The right  ventricular size is mildly enlarged. Tricuspid regurgitation signal is  inadequate for assessing PA pressure.  3. Mild to moderate mitral valve regurgitation. There is moderate  thickening of the mitral valve leaflet(s). There is mild calcification of  the mitral valve leaflet(s extending into the subvalvular apparatus.  4. The aortic valve is tricuspid. Aortic valve regurgitation is not  visualized. Mild to moderate aortic valve sclerosis/calcification is  present, without any evidence of aortic stenosis.  5. The inferior vena cava is normal in size with <50% respiratory  variability, suggesting right atrial pressure of 8 mmHg.   Cardiac Cath 06/25/19 1. No angiographic evidence of CAD 2. Elevated right heart pressures (RA 14, RV 54/8/14, PA 57/25/37, PCWP 26).   Will admit to telemetry and diurese today with IV Lasix. She presented to the ED this am with severe chest  pressure and dyspnea which is felt to be due to her volume overload  Coronary Diagrams  Diagnostic Dominance: Right  Intervention   _____________   History of Present Illness     Alexandria Buck is a 59 y.o. female with medical history of hypertension, moderate mitral regurgitation, bipolar disorder, tobacco abuse, hep C and recent findings of acute CHF and cardiomyopathy who was scheduled for outpatient cardiac cath but presented to the ED 06/25/2019 early with worsening chest pain the last 2 weeks.  No nausea, vomiting, diaphoresis but did she feel little short of breath.  Patient called EMS who brought her to the ED.  The patient was seen earlier in the week by Dr. Alda Lea.  Echo showed an LVEF 25 to 30% with mild to moderate MR.  BNP at office visit was elevated.  Hospital Course     Consultants: Neurology  The patient presented to the ED 06/25/2019 for chest pain. In the the ED EKG was unchanged from prior. Troponin negative.  Her cardiac catheterization was moved up to 7:30 AM.  BNP 1,532 and she was started on IV Lasix. The patient was taken down to the Cath Lab and right radial access was established.  Cardiac catheterization showed no evidence of CAD and elevated right heart pressures, LVEDP 53mmHg, PCW mean 80mmHg and RVEDP 35mmHg. Patient tolerated cath procedure well with no complications. Post cath recommendations for admission to telemetry with diuresis. Neurology was consulted for dizziness that began 2 to 3 months ago when all her heart issues started.  The dizziness was described as lightheaded and the room is spinning could occur while sitting or standing up.  MRI of the brain was ordered as well as head and neck to  evaluate for ischemic disease. Imaging was unremarkable.  CT head and neck ordered as well which showed normal variant anatomy, unlikely contributory to dizziness.  Neurology signed off with recommendations for outpatient PT evaluation and plan to follow-up with  neurology as outpatient.  Patient had good response to IV Lasix. Labs showed hypokalemia which was repleted. Toprol was changed to carvedilol and lisinopril was held in anticipation of changing to losartan at discharge. Spironolactone was added. Creatinine remained stable throughout the admission.  Patient continued to complain of intermittent dizziness however BP remained stable. On day of discharge potassium 4.2. Will plan to discharge patient with new prescriptions for carvedilol 3.125 mg twice daily, losartan 5 mg daily (start the next day), spironolactone 12.5 mg daily, and Lasix 40 mg daily.  Will arrange follow-up with Dr. Alda Lea with labs in 1 week.  Patient was evaluated by Dr. Margaretann Loveless 06/27/2019 and felt to be stable for discharge.  Did the patient have an acute coronary syndrome (MI, NSTEMI, STEMI, etc) this admission?:  No                               Did the patient have a percutaneous coronary intervention (stent / angioplasty)?:  No.   _____________  Discharge Vitals Blood pressure 113/76, pulse 74, temperature 98.4 F (36.9 C), temperature source Oral, resp. rate 20, height 5\' 1"  (1.549 m), weight 46.5 kg, SpO2 100 %.  Filed Weights   06/26/19 0454 06/27/19 0440  Weight: 47.2 kg 46.5 kg    Labs & Radiologic Studies    CBC Recent Labs    06/25/19 0625 06/25/19 0808 06/25/19 0814 06/27/19 0742  WBC 5.9  --   --  5.1  HGB 12.3   < > 11.6* 12.0  HCT 38.6   < > 34.0* 37.7  MCV 92.8  --   --  92.2  PLT 181  --   --  210   < > = values in this interval not displayed.   Basic Metabolic Panel Recent Labs    06/26/19 0240 06/27/19 0742  NA 137 138  K 3.1* 4.2  CL 104 108  CO2 23 22  GLUCOSE 102* 86  BUN 8 8  CREATININE 0.97 1.07*  CALCIUM 8.3* 8.7*  MG  --  2.0   Liver Function Tests No results for input(s): AST, ALT, ALKPHOS, BILITOT, PROT, ALBUMIN in the last 72 hours. No results for input(s): LIPASE, AMYLASE in the last 72 hours. High Sensitivity Troponin:    Recent Labs  Lab 06/25/19 0625 06/25/19 1018  TROPONINIHS 17 20*    BNP Invalid input(s): POCBNP D-Dimer No results for input(s): DDIMER in the last 72 hours. Hemoglobin A1C No results for input(s): HGBA1C in the last 72 hours. Fasting Lipid Panel No results for input(s): CHOL, HDL, LDLCALC, TRIG, CHOLHDL, LDLDIRECT in the last 72 hours. Thyroid Function Tests No results for input(s): TSH, T4TOTAL, T3FREE, THYROIDAB in the last 72 hours.  Invalid input(s): FREET3 _____________  CT ANGIO HEAD W OR WO CONTRAST  Result Date: 06/26/2019 CLINICAL DATA:  Dizziness EXAM: CT ANGIOGRAPHY HEAD AND NECK TECHNIQUE: Multidetector CT imaging of the head and neck was performed using the standard protocol during bolus administration of intravenous contrast. Multiplanar CT image reconstructions and MIPs were obtained to evaluate the vascular anatomy. Carotid stenosis measurements (when applicable) are obtained utilizing NASCET criteria, using the distal internal carotid diameter as the denominator. CONTRAST:  77mL OMNIPAQUE IOHEXOL  350 MG/ML SOLN COMPARISON:  05/18/2019 FINDINGS: CT HEAD FINDINGS Brain: There is no acute intracranial hemorrhage, mass effect, or edema. Gray-white differentiation is preserved. Patchy and confluent areas of hypoattenuation in the supratentorial white matter are nonspecific but likely reflect moderate chronic microvascular ischemic changes. 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 There is no extra-axial fluid collection. Ventricles and sulci are within normal limits in size and configuration. Vascular: Unremarkable Skull: Calvarium is unremarkable. Sinuses/Orbits: No acute finding. Other: None. Review of the MIP images confirms the above findings CTA NECK FINDINGS Aortic arch: Great vessel origins are patent. Right carotid system: Patent. Mild calcified and noncalcified plaque along the proximal ICA causing minimal stenosis. Left carotid system: Patent. Mild  calcified and noncalcified plaque along the proximal ICA causing minimal stenosis. Vertebral arteries: Diminutive right vertebral artery. This is not well seen beyond the V2 segment patent small caliber left vertebral artery. Skeleton: Cervical spine degenerative changes, greatest at C6-C7 and C7-T1. Other neck: No mass or adenopathy. Upper chest: Paraseptal and centrilobular emphysema with bullous changes. Review of the MIP images confirms the above findings CTA HEAD FINDINGS Anterior circulation: Intracranial internal carotid arteries patent with trace calcified plaque. Anterior and middle cerebral arteries are patent Posterior circulation: Intracranial right vertebral artery is not visualized. There is a diminutive left vertebral artery after PICA origin. Diminutive proximal basilar artery. Posterior cerebral arteries are patent. Venous sinuses: As permitted by contrast timing, patent. 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 This reconstitutes distal basilar artery with patent superior cerebellar arteries. Fetal origin of both posterior cerebral arteries. Review of the MIP images confirms the above findings IMPRESSION: No acute intracranial abnormality. Chronic microvascular ischemic changes. Chronic infarct of left caudate. There is variant anatomy as noted on the prior MRA. 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 Unclear if this constitutes the physiologic basis for reported dizziness. Electronically Signed   By: Macy Mis M.D.   On: 06/26/2019 20:46   DG Chest 2 View  Result Date: 06/25/2019 CLINICAL DATA:  Chest pain, scheduled for cardiac catheterization EXAM: CHEST - 2 VIEW COMPARISON:  Radiograph 05/18/2019 FINDINGS: Fine reticular opacities are similar to comparisons and likely  reflect the paraseptal emphysematous changes seen on comparison CT. Additional bandlike areas of subsegmental atelectasis or scarring noted in the left mid lung. Stable slightly thickened appearance of the right minor fissure. No pneumothorax or effusion. Cardiomegaly is similar to prior. The aorta is calcified. The remaining cardiomediastinal contours are unremarkable. No acute osseous or soft tissue abnormality. Telemetry leads overlie the chest. IMPRESSION: No acute cardiopulmonary findings. Stable cardiomegaly. Fine reticular opacities compatible with paraseptal emphysematous changes though mild interstitial edema could have a similar appearance. Electronically Signed   By: Lovena Le M.D.   On: 06/25/2019 07:00   CT ANGIO NECK W OR WO CONTRAST  Result Date: 06/26/2019 CLINICAL DATA:  Dizziness EXAM: CT ANGIOGRAPHY HEAD AND NECK TECHNIQUE: Multidetector CT imaging of the head and neck was performed using the standard protocol during bolus administration of intravenous contrast. Multiplanar CT image reconstructions and MIPs were obtained to evaluate the vascular anatomy. Carotid stenosis measurements (when applicable) are obtained utilizing NASCET criteria, using the distal internal carotid diameter as the denominator. CONTRAST:  46mL OMNIPAQUE IOHEXOL 350 MG/ML SOLN COMPARISON:  05/18/2019 FINDINGS: CT HEAD FINDINGS Brain: There is  no acute intracranial hemorrhage, mass effect, or edema. Gray-white differentiation is preserved. Patchy and confluent areas of hypoattenuation in the supratentorial white matter are nonspecific but likely reflect moderate chronic microvascular ischemic changes. 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 There is no extra-axial fluid collection. Ventricles and sulci are within normal limits in size and configuration. Vascular: Unremarkable Skull: Calvarium is unremarkable. Sinuses/Orbits: No acute finding. Other: None. Review of the MIP images confirms the  above findings CTA NECK FINDINGS Aortic arch: Great vessel origins are patent. Right carotid system: Patent. Mild calcified and noncalcified plaque along the proximal ICA causing minimal stenosis. Left carotid system: Patent. Mild calcified and noncalcified plaque along the proximal ICA causing minimal stenosis. Vertebral arteries: Diminutive right vertebral artery. This is not well seen beyond the V2 segment patent small caliber left vertebral artery. Skeleton: Cervical spine degenerative changes, greatest at C6-C7 and C7-T1. Other neck: No mass or adenopathy. Upper chest: Paraseptal and centrilobular emphysema with bullous changes. Review of the MIP images confirms the above findings CTA HEAD FINDINGS Anterior circulation: Intracranial internal carotid arteries patent with trace calcified plaque. Anterior and middle cerebral arteries are patent Posterior circulation: Intracranial right vertebral artery is not visualized. There is a diminutive left vertebral artery after PICA origin. Diminutive proximal basilar artery. Posterior cerebral arteries are patent. Venous sinuses: As permitted by contrast timing, patent. 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 This reconstitutes distal basilar artery with patent superior cerebellar arteries. Fetal origin of both posterior cerebral arteries. Review of the MIP images confirms the above findings IMPRESSION: No acute intracranial abnormality. Chronic microvascular ischemic changes. Chronic infarct of left caudate. There is variant anatomy as noted on the prior MRA. 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 Unclear if this constitutes the physiologic basis for reported dizziness. Electronically Signed   By: Macy Mis M.D.   On: 06/26/2019 20:46   MR ANGIO  HEAD WO CONTRAST  Result Date: 06/26/2019 CLINICAL DATA:  Dizziness EXAM: MRI HEAD WITHOUT CONTRAST MRA HEAD WITHOUT CONTRAST MRA NECK WITHOUT AND WITH CONTRAST TECHNIQUE: Multiplanar, multiecho pulse sequences of the brain and surrounding structures were obtained without and with intravenous contrast. Angiographic images of the Circle of Willis were obtained using MRA technique without intravenous contrast. Angiographic images of the neck were obtained using MRA technique without and with intravenous contrast. Carotid stenosis measurements (when applicable) are obtained utilizing NASCET criteria, using the distal internal carotid diameter as the denominator. CONTRAST:  4.62mL GADAVIST GADOBUTROL 1 MMOL/ML IV SOLN COMPARISON:  MRI brain 2018 FINDINGS: MRI HEAD FINDINGS Brain: There is no acute infarction or intracranial hemorrhage. There is no intracranial mass, mass effect, or edema. There is no hydrocephalus or extra-axial fluid collection. Patchy and confluent areas of T2 hyperintensity in the supratentorial much greater than pontine white matter are nonspecific but probably reflect stable moderate chronic microvascular ischemic changes. Vascular: Major vessel flow voids at the skull base are preserved. Skull and upper cervical spine: Normal marrow signal is preserved. Sinuses/Orbits: Paranasal sinuses are aerated. Orbits are unremarkable. Other: Sella is unremarkable.  Mastoid air cells are clear. MRA HEAD FINDINGS Intracranial internal carotid arteries are patent. Middle and anterior cerebral arteries are patent. Intracranial left vertebral artery is patent. There is minimal opacification of the proximal basilar artery. 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 Bilateral posterior cerebral arteries are patent with bilateral fetal origin. MRA NECK FINDINGS Common, internal, and external carotid arteries are patent. Small caliber extracranial  left vertebral artery is patent. There is minimal visualization of the extracranial right vertebral artery. IMPRESSION: No acute infarction. Stable moderate chronic microvascular ischemic changes. 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 Presence of superimposed stenosis is difficult to exclude. Posterior cerebral arteries are also supplied by anterior circulation via posterior communicating arteries. Electronically Signed   By: Macy Mis M.D.   On: 06/26/2019 15:29   MR ANGIO NECK W WO CONTRAST  Result Date: 06/26/2019 CLINICAL DATA:  Dizziness EXAM: MRI HEAD WITHOUT CONTRAST MRA HEAD WITHOUT CONTRAST MRA NECK WITHOUT AND WITH CONTRAST TECHNIQUE: Multiplanar, multiecho pulse sequences of the brain and surrounding structures were obtained without and with intravenous contrast. Angiographic images of the Circle of Willis were obtained using MRA technique without intravenous contrast. Angiographic images of the neck were obtained using MRA technique without and with intravenous contrast. Carotid stenosis measurements (when applicable) are obtained utilizing NASCET criteria, using the distal internal carotid diameter as the denominator. CONTRAST:  4.65mL GADAVIST GADOBUTROL 1 MMOL/ML IV SOLN COMPARISON:  MRI brain 2018 FINDINGS: MRI HEAD FINDINGS Brain: There is no acute infarction or intracranial hemorrhage. There is no intracranial mass, mass effect, or edema. There is no hydrocephalus or extra-axial fluid collection. Patchy and confluent areas of T2 hyperintensity in the supratentorial much greater than pontine white matter are nonspecific but probably reflect stable moderate chronic microvascular ischemic changes. Vascular: Major vessel flow voids at the skull base are preserved. Skull and upper cervical spine: Normal marrow signal is preserved.  Sinuses/Orbits: Paranasal sinuses are aerated. Orbits are unremarkable. Other: Sella is unremarkable.  Mastoid air cells are clear. MRA HEAD FINDINGS Intracranial internal carotid arteries are patent. Middle and anterior cerebral arteries are patent. Intracranial left vertebral artery is patent. There is minimal opacification of the proximal basilar artery. 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 Bilateral posterior cerebral arteries are patent with bilateral fetal origin. MRA NECK FINDINGS Common, internal, and external carotid arteries are patent. Small caliber extracranial left vertebral artery is patent. There is minimal visualization of the extracranial right vertebral artery. IMPRESSION: No acute infarction. Stable moderate chronic microvascular ischemic changes. 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 Presence of superimposed stenosis is difficult to exclude. Posterior cerebral arteries are also supplied by anterior circulation via posterior communicating arteries. Electronically Signed   By: Macy Mis M.D.   On: 06/26/2019 15:29   MR BRAIN WO CONTRAST  Result Date: 06/26/2019 CLINICAL DATA:  Dizziness EXAM: MRI HEAD WITHOUT CONTRAST MRA HEAD WITHOUT CONTRAST MRA NECK WITHOUT AND WITH CONTRAST TECHNIQUE: Multiplanar, multiecho pulse sequences of the brain and surrounding structures were obtained without and with intravenous contrast. Angiographic images of the Circle of Willis were obtained using MRA technique without intravenous contrast. Angiographic images of the neck were obtained using MRA technique without and with intravenous contrast. Carotid stenosis measurements (when applicable) are obtained utilizing NASCET criteria, using the distal internal carotid  diameter as the denominator. CONTRAST:  4.71mL GADAVIST GADOBUTROL 1 MMOL/ML IV SOLN COMPARISON:  MRI brain 2018 FINDINGS: MRI HEAD FINDINGS Brain: There is no acute infarction or intracranial hemorrhage. There is no intracranial mass, mass effect, or edema. There is no hydrocephalus or extra-axial fluid collection. Patchy and confluent areas of T2 hyperintensity in the supratentorial much greater than pontine white matter are nonspecific but probably reflect stable moderate chronic microvascular ischemic changes. Vascular: Major vessel flow voids at the skull base are preserved. Skull and upper cervical spine: Normal marrow signal is preserved. Sinuses/Orbits: Paranasal sinuses are aerated. Orbits are unremarkable. Other: Sella is unremarkable.  Mastoid air cells are clear. MRA HEAD FINDINGS Intracranial internal carotid arteries are patent. Middle and anterior cerebral arteries are patent. Intracranial left vertebral artery is patent. There is minimal opacification of the proximal basilar artery. 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 Bilateral posterior cerebral arteries are patent with bilateral fetal origin. MRA NECK FINDINGS Common, internal, and external carotid arteries are patent. Small caliber extracranial left vertebral artery is patent. There is minimal visualization of the extracranial right vertebral artery. IMPRESSION: No acute infarction. Stable moderate chronic microvascular ischemic changes. 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 Presence of superimposed stenosis is difficult to exclude. Posterior cerebral arteries are also supplied by anterior circulation via posterior communicating arteries. Electronically Signed   By: Macy Mis M.D.   On: 06/26/2019 15:29    CARDIAC CATHETERIZATION  Result Date: 06/25/2019 1. No angiographic evidence of CAD 2. Elevated right heart pressures (RA 14, RV 54/8/14, PA 57/25/37, PCWP 26). Will admit to telemetry and diurese today with IV Lasix. She presented to the ED this am with severe chest pressure and dyspnea which is felt to be due to her volume overload.   Disposition   Pt is being discharged home today in good condition.  Follow-up Plans & Appointments    Follow-up Information    [facility]. Schedule an appointment as soon as possible for a visit in 1 week(s).   Contact information: [place] [phone]       Donato Heinz, MD Follow up.   Specialty: Cardiology Why: The office will call to arrange follow-up in 7-10 days.  Contact information: [address] [place] [address] [phone]        [facility] Follow up on 07/03/2019.   Specialty: Cardiology Why: Go to [facility] for blood work 07/03/19 anytimte 7:30 to 4:30 PM Contact information: [place] [phone]         Discharge Instructions    Ambulatory referral to Neurology   Complete by: As directed    An appointment is requested in approximately: 2 weeks      Discharge Medications   Allergies as of 06/27/2019   No Known Allergies     Medication List    STOP taking these medications   lisinopril 10 MG tablet Commonly known as: ZESTRIL   metoprolol succinate 25 MG 24 hr tablet Commonly known as: TOPROL-XL     TAKE these medications   carvedilol 3.125 MG tablet Commonly known as: COREG Take 1 tablet (3.125 mg total) by mouth 2 (two) times daily with a meal.   furosemide 40 MG tablet Commonly known as: LASIX Take 1 tablet (40 mg total)  by mouth daily.   lithium carbonate 150 MG capsule Take 150 mg by mouth 2 (two) times  daily with a meal.   losartan 25 MG tablet Commonly known as: COZAAR Take 1 tablet (25 mg total) by mouth daily. Start this 06/28/19   nitroGLYCERIN 0.4 MG SL tablet Commonly known as: NITROSTAT Place 1 tablet (0.4 mg total) under the tongue every 5 (five) minutes as needed for chest pain.   PARoxetine 10 MG tablet Commonly known as: PAXIL Take 10 mg by mouth at bedtime.   risperiDONE 1 MG tablet Commonly known as: RISPERDAL Take 1 mg by mouth at bedtime.   spironolactone 25 MG tablet Commonly known as: ALDACTONE Take 0.5 tablets (12.5 mg total) by mouth daily.          Outstanding Labs/Studies   BMET  In 1 week  Duration of Discharge Encounter   Greater than 30 minutes including physician time.  Signed, Ariatna Jester Ninfa Meeker, PA-C 06/27/2019, 9:37 AM

## 2019-06-27 NOTE — Progress Notes (Signed)
CTA with normal variant anatomy, unlikley to be contributory, especially given normal MRI. She may benefit from outpatient PT eval, but no further inpatient workup at this time from neuro perspective.   Please call with further questions or concerns.   Roland Rack, MD [facility] [phone]  If 7pm- 7am, please page neurology on call as listed in [facility].

## 2019-06-27 NOTE — Progress Notes (Signed)
Received referral to assist pt with meds and transportation. Colletta Maryland, RN reports that the SW provided the taxi voucher. Unable to assist pt with her prescriptions through the [facility] program because we helped her last month. The physician e-scribe the prescriptions to [facility]. Searched for the new medications at [facility] and the meds are cheaper at [facility]. Sent a message to the physician and asked her to write a hardcopy for the prescriptions, so pt can fill the prescriptions at [facility]. Met with pt and provided pt with a GoodRx card. Pt lives with her boyfriend. Discussed with pt the importance of filling the prescriptions and taking her meds as prescribed. She reports that she sees Dr.Johnson at the [facility]. Discussed with pt the importance of seeing a physician after being D/C from the hospital. Encouraged pt to call the [facility] tomorrow morning and make an appointment with Dr. Wynetta Emery.

## 2019-06-27 NOTE — Progress Notes (Signed)
Progress Note  Patient Name: Alexandria Buck Date of Encounter: 06/27/2019  Primary Cardiologist: Portis comfortably, denies chest pain or SOB.  Inpatient Medications    Scheduled Meds: . carvedilol  3.125 mg Oral BID WC  . furosemide  40 mg Oral Daily  . lithium carbonate  150 mg Oral BID WC  . losartan  25 mg Oral Daily  . Melatonin  3 mg Oral Once  . PARoxetine  10 mg Oral QHS  . risperiDONE  1 mg Oral QHS  . sodium chloride flush  3 mL Intravenous Once  . sodium chloride flush  3 mL Intravenous Q12H  . sodium chloride flush  3 mL Intravenous Q12H  . spironolactone  12.5 mg Oral Daily   Continuous Infusions: . sodium chloride     PRN Meds: sodium chloride, acetaminophen, nitroGLYCERIN, ondansetron (ZOFRAN) IV, sodium chloride flush   Vital Signs    Vitals:   06/26/19 0454 06/26/19 1239 06/26/19 1949 06/27/19 0440  BP: 113/76 (!) 124/95 112/89 113/76  Pulse: 63  78 74  Resp: 14 20 19 20   Temp: 98.2 F (36.8 C) 98.6 F (37 C) (!) 97.5 F (36.4 C) 98.4 F (36.9 C)  TempSrc: Oral Oral Oral Oral  SpO2: 97% 97% 100% 100%  Weight: 47.2 kg   46.5 kg  Height:  5\' 1"  (1.549 m)      Intake/Output Summary (Last 24 hours) at 06/27/2019 0714 Last data filed at 06/27/2019 0430 Gross per 24 hour  Intake 240 ml  Output 1200 ml  Net -960 ml   Last 3 Weights 06/27/2019 06/26/2019 06/22/2019  Weight (lbs) 102 lb 8.2 oz 104 lb 0.9 oz 110 lb  Weight (kg) 46.5 kg 47.2 kg 49.896 kg      Telemetry    SR with paired PVCs - Personally Reviewed  ECG    SR, T wave inversions laterally 06/25/19 - Personally Reviewed  Physical Exam   GEN: No acute distress.   Neck: No JVD Cardiac: RRR, no murmurs, rubs, or gallops.  Respiratory: Clear to auscultation bilaterally. GI: Soft, nontender, non-distended  MS: No edema; No deformity. Neuro:  Nonfocal  Psych: Normal affect   Labs    High Sensitivity Troponin:   Recent Labs  Lab 06/25/19 0625  06/25/19 1018  TROPONINIHS 17 20*      Chemistry Recent Labs  Lab 06/22/19 0947 06/22/19 0947 06/25/19 0625 06/25/19 0625 06/25/19 0808 06/25/19 0814 06/26/19 0240  NA 136  --  136   < > 144 144 137  K 4.5   < > 5.1   < > 3.6 3.5 3.1*  CL 102  --  106  --   --   --  104  CO2 17*  --  17*  --   --   --  23  GLUCOSE 73  --  77  --   --   --  102*  BUN 6*  --  7  --   --   --  8  CREATININE 0.97  --  1.02*  --   --   --  0.97  CALCIUM 8.9  --  8.7*  --   --   --  8.3*  GFRNONAA 64  --  60*  --   --   --  >60  GFRAA 73  --  >60  --   --   --  >60  ANIONGAP  --   --  13  --   --   --  10   < > = values in this interval not displayed.     Hematology Recent Labs  Lab 06/22/19 0947 06/25/19 0625 06/25/19 0808 06/25/19 0814  WBC 5.5 5.9  --   --   RBC 4.56 4.16  --   --   HGB 13.6 12.3 11.9* 11.6*  HCT 40.4 38.6 35.0* 34.0*  MCV 89 92.8  --   --   MCH 29.8 29.6  --   --   MCHC 33.7 31.9  --   --   RDW 12.2 14.1  --   --   PLT 209 181  --   --     BNP Recent Labs  Lab 06/22/19 0947  BNP 1,532.7*     DDimer No results for input(s): DDIMER in the last 168 hours.   Radiology    CT ANGIO HEAD W OR WO CONTRAST  Result Date: 06/26/2019 CLINICAL DATA:  Dizziness EXAM: CT ANGIOGRAPHY HEAD AND NECK TECHNIQUE: Multidetector CT imaging of the head and neck was performed using the standard protocol during bolus administration of intravenous contrast. Multiplanar CT image reconstructions and MIPs were obtained to evaluate the vascular anatomy. Carotid stenosis measurements (when applicable) are obtained utilizing NASCET criteria, using the distal internal carotid diameter as the denominator. CONTRAST:  67mL OMNIPAQUE IOHEXOL 350 MG/ML SOLN COMPARISON:  05/18/2019 FINDINGS: CT HEAD FINDINGS Brain: There is no acute intracranial hemorrhage, mass effect, or edema. Gray-white differentiation is preserved. Patchy and confluent areas of hypoattenuation in the supratentorial white  matter are nonspecific but likely reflect moderate chronic microvascular ischemic changes. 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 There is no extra-axial fluid collection. Ventricles and sulci are within normal limits in size and configuration. Vascular: Unremarkable Skull: Calvarium is unremarkable. Sinuses/Orbits: No acute finding. Other: None. Review of the MIP images confirms the above findings CTA NECK FINDINGS Aortic arch: Great vessel origins are patent. Right carotid system: Patent. Mild calcified and noncalcified plaque along the proximal ICA causing minimal stenosis. Left carotid system: Patent. Mild calcified and noncalcified plaque along the proximal ICA causing minimal stenosis. Vertebral arteries: Diminutive right vertebral artery. This is not well seen beyond the V2 segment patent small caliber left vertebral artery. Skeleton: Cervical spine degenerative changes, greatest at C6-C7 and C7-T1. Other neck: No mass or adenopathy. Upper chest: Paraseptal and centrilobular emphysema with bullous changes. Review of the MIP images confirms the above findings CTA HEAD FINDINGS Anterior circulation: Intracranial internal carotid arteries patent with trace calcified plaque. Anterior and middle cerebral arteries are patent Posterior circulation: Intracranial right vertebral artery is not visualized. There is a diminutive left vertebral artery after PICA origin. Diminutive proximal basilar artery. Posterior cerebral arteries are patent. Venous sinuses: As permitted by contrast timing, patent. 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 This reconstitutes distal basilar artery with patent superior cerebellar arteries. Fetal origin of both posterior cerebral arteries. Review of the MIP images confirms the above findings IMPRESSION: No acute intracranial abnormality. Chronic microvascular ischemic changes. Chronic infarct of left caudate. There is  variant anatomy as noted on the prior MRA. 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 Unclear if this constitutes the physiologic basis for reported dizziness. Electronically Signed   By: Macy Mis M.D.   On: 06/26/2019 20:46   CT ANGIO NECK W OR WO CONTRAST  Result Date: 06/26/2019 CLINICAL DATA:  Dizziness EXAM: CT ANGIOGRAPHY HEAD AND NECK TECHNIQUE: Multidetector CT imaging of the head and neck was performed using the standard protocol during bolus administration of intravenous contrast. Multiplanar CT image reconstructions and MIPs were obtained to evaluate the vascular anatomy. Carotid stenosis measurements (when applicable) are obtained utilizing NASCET criteria, using the distal internal carotid diameter as the denominator. CONTRAST:  64mL OMNIPAQUE IOHEXOL 350 MG/ML SOLN COMPARISON:  05/18/2019 FINDINGS: CT HEAD FINDINGS Brain: There is no acute intracranial hemorrhage, mass effect, or edema. Gray-white differentiation is preserved. Patchy and confluent areas of hypoattenuation in the supratentorial white matter are nonspecific but likely reflect moderate chronic microvascular ischemic changes. 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 There is no extra-axial fluid collection. Ventricles and sulci are within normal limits in size and configuration. Vascular: Unremarkable Skull: Calvarium is unremarkable. Sinuses/Orbits: No acute finding. Other: None. Review of the MIP images confirms the above findings CTA NECK FINDINGS Aortic arch: Great vessel origins are patent. Right carotid system: Patent. Mild calcified and noncalcified plaque along the proximal ICA causing minimal stenosis. Left carotid system: Patent. Mild calcified and noncalcified plaque along the proximal ICA causing minimal stenosis. Vertebral arteries:  Diminutive right vertebral artery. This is not well seen beyond the V2 segment patent small caliber left vertebral artery. Skeleton: Cervical spine degenerative changes, greatest at C6-C7 and C7-T1. Other neck: No mass or adenopathy. Upper chest: Paraseptal and centrilobular emphysema with bullous changes. Review of the MIP images confirms the above findings CTA HEAD FINDINGS Anterior circulation: Intracranial internal carotid arteries patent with trace calcified plaque. Anterior and middle cerebral arteries are patent Posterior circulation: Intracranial right vertebral artery is not visualized. There is a diminutive left vertebral artery after PICA origin. Diminutive proximal basilar artery. Posterior cerebral arteries are patent. Venous sinuses: As permitted by contrast timing, patent. 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 This reconstitutes distal basilar artery with patent superior cerebellar arteries. Fetal origin of both posterior cerebral arteries. Review of the MIP images confirms the above findings IMPRESSION: No acute intracranial abnormality. Chronic microvascular ischemic changes. Chronic infarct of left caudate. There is variant anatomy as noted on the prior MRA. 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 Unclear if this constitutes the physiologic basis for reported dizziness. Electronically Signed   By: Macy Mis M.D.   On: 06/26/2019 20:46   MR ANGIO HEAD WO CONTRAST  Result Date: 06/26/2019 CLINICAL DATA:  Dizziness EXAM: MRI HEAD WITHOUT CONTRAST MRA HEAD WITHOUT CONTRAST MRA NECK WITHOUT AND WITH CONTRAST TECHNIQUE: Multiplanar, multiecho pulse sequences of the brain and surrounding structures were obtained without and with intravenous contrast. Angiographic images of the  Circle of Willis were obtained using MRA technique without intravenous contrast. Angiographic images of the neck were obtained using MRA technique without and with intravenous contrast. Carotid stenosis measurements (when applicable) are obtained utilizing NASCET criteria, using the distal internal carotid diameter as the denominator. CONTRAST:  4.34mL GADAVIST GADOBUTROL 1 MMOL/ML IV SOLN COMPARISON:  MRI brain 2018 FINDINGS: MRI HEAD FINDINGS Brain: There is no acute infarction or intracranial hemorrhage. There is no intracranial mass, mass effect, or edema. There is no hydrocephalus or extra-axial fluid collection. Patchy and confluent areas of T2 hyperintensity in the supratentorial much greater than pontine white matter are nonspecific but probably reflect  stable moderate chronic microvascular ischemic changes. Vascular: Major vessel flow voids at the skull base are preserved. Skull and upper cervical spine: Normal marrow signal is preserved. Sinuses/Orbits: Paranasal sinuses are aerated. Orbits are unremarkable. Other: Sella is unremarkable.  Mastoid air cells are clear. MRA HEAD FINDINGS Intracranial internal carotid arteries are patent. Middle and anterior cerebral arteries are patent. Intracranial left vertebral artery is patent. There is minimal opacification of the proximal basilar artery. 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 Bilateral posterior cerebral arteries are patent with bilateral fetal origin. MRA NECK FINDINGS Common, internal, and external carotid arteries are patent. Small caliber extracranial left vertebral artery is patent. There is minimal visualization of the extracranial right vertebral artery. IMPRESSION: No acute infarction. Stable moderate chronic microvascular ischemic changes. 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 Presence of superimposed stenosis is difficult to exclude. Posterior cerebral arteries are also supplied by anterior circulation via posterior communicating arteries. Electronically Signed   By: Macy Mis M.D.   On: 06/26/2019 15:29   MR ANGIO NECK W WO CONTRAST  Result Date: 06/26/2019 CLINICAL DATA:  Dizziness EXAM: MRI HEAD WITHOUT CONTRAST MRA HEAD WITHOUT CONTRAST MRA NECK WITHOUT AND WITH CONTRAST TECHNIQUE: Multiplanar, multiecho pulse sequences of the brain and surrounding structures were obtained without and with intravenous contrast. Angiographic images of the Circle of Willis were obtained using MRA technique without intravenous contrast. Angiographic images of the neck were obtained using MRA technique without and with intravenous contrast. Carotid stenosis measurements (when applicable) are obtained utilizing NASCET criteria, using the distal internal carotid diameter as the denominator. CONTRAST:  4.82mL GADAVIST GADOBUTROL 1 MMOL/ML IV SOLN COMPARISON:  MRI brain 2018 FINDINGS: MRI HEAD FINDINGS Brain: There is no acute infarction or intracranial hemorrhage. There is no intracranial mass, mass effect, or edema. There is no hydrocephalus or extra-axial fluid collection. Patchy and confluent areas of T2 hyperintensity in the supratentorial much greater than pontine white matter are nonspecific but probably reflect stable moderate chronic microvascular ischemic changes. Vascular: Major vessel flow voids at the skull base are preserved. Skull and upper cervical spine: Normal marrow signal is preserved. Sinuses/Orbits: Paranasal sinuses are aerated. Orbits are unremarkable. Other: Sella is unremarkable.  Mastoid air cells are clear. MRA HEAD FINDINGS Intracranial internal carotid arteries are patent. Middle and anterior cerebral arteries are patent. Intracranial left vertebral artery is patent. There is minimal opacification  of the proximal basilar artery. 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 Bilateral posterior cerebral arteries are patent with bilateral fetal origin. MRA NECK FINDINGS Common, internal, and external carotid arteries are patent. Small caliber extracranial left vertebral artery is patent. There is minimal visualization of the extracranial right vertebral artery. IMPRESSION: No acute infarction. Stable moderate chronic microvascular ischemic changes. 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 Presence of superimposed stenosis is difficult to exclude. Posterior cerebral arteries are also supplied by anterior circulation via posterior communicating arteries. Electronically Signed   By: Macy Mis M.D.   On: 06/26/2019 15:29   MR BRAIN WO CONTRAST  Result Date: 06/26/2019 CLINICAL DATA:  Dizziness EXAM: MRI HEAD WITHOUT CONTRAST MRA HEAD WITHOUT CONTRAST  MRA NECK WITHOUT AND WITH CONTRAST TECHNIQUE: Multiplanar, multiecho pulse sequences of the brain and surrounding structures were obtained without and with intravenous contrast. Angiographic images of the Circle of Willis were obtained using MRA technique without intravenous contrast. Angiographic images of the neck were obtained using MRA technique without and with intravenous contrast. Carotid stenosis measurements (when applicable) are obtained utilizing NASCET criteria, using the distal internal carotid diameter as the denominator. CONTRAST:  4.24mL GADAVIST GADOBUTROL 1 MMOL/ML IV SOLN COMPARISON:  MRI brain 2018 FINDINGS: MRI HEAD FINDINGS Brain: There is no acute infarction or intracranial hemorrhage. There is no intracranial mass, mass effect, or edema. There is no hydrocephalus or extra-axial fluid collection. Patchy and  confluent areas of T2 hyperintensity in the supratentorial much greater than pontine white matter are nonspecific but probably reflect stable moderate chronic microvascular ischemic changes. Vascular: Major vessel flow voids at the skull base are preserved. Skull and upper cervical spine: Normal marrow signal is preserved. Sinuses/Orbits: Paranasal sinuses are aerated. Orbits are unremarkable. Other: Sella is unremarkable.  Mastoid air cells are clear. MRA HEAD FINDINGS Intracranial internal carotid arteries are patent. Middle and anterior cerebral arteries are patent. Intracranial left vertebral artery is patent. There is minimal opacification of the proximal basilar artery. 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 Bilateral posterior cerebral arteries are patent with bilateral fetal origin. MRA NECK FINDINGS Common, internal, and external carotid arteries are patent. Small caliber extracranial left vertebral artery is patent. There is minimal visualization of the extracranial right vertebral artery. IMPRESSION: No acute infarction. Stable moderate chronic microvascular ischemic changes. 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 Presence of superimposed stenosis is difficult to exclude. Posterior cerebral arteries are also supplied by anterior circulation via posterior communicating arteries. Electronically Signed   By: Macy Mis M.D.   On: 06/26/2019 15:29   CARDIAC CATHETERIZATION  Result Date: 06/25/2019 1. No angiographic evidence of CAD 2. Elevated right heart pressures (RA 14, RV 54/8/14, PA 57/25/37, PCWP 26). Will admit to telemetry and diurese today with IV Lasix. She presented to the ED this am with severe chest pressure and dyspnea which is felt to be due to her  volume overload.    Cardiac Studies   Cardiac Cath 06/25/2019 Conclusion  1. No angiographic evidence of CAD 2. Elevated right heart pressures (RA 14, RV 54/8/14, PA 57/25/37, PCWP 26).   Will admit to telemetry and diurese today with IV Lasix. She presented to the ED this am with severe chest pressure and dyspnea which is felt to be due to her volume overload.    2D echo 05/19/2019 IMPRESSIONS   1. Left ventricular ejection fraction, by estimation, is 25 to 30%. The  left ventricle has severely decreased function. The left ventrical  demonstrates global hypokinesis. Left ventricular diastolic parameters are  consistent with Grade II diastolic  dysfunction (pseudonormalization). Elevated left ventricular end-diastolic  pressure.  2. Right ventricular systolic function is mildly reduced. The right  ventricular size is mildly enlarged. Tricuspid regurgitation signal is  inadequate for assessing PA pressure.  3. Mild to moderate mitral valve regurgitation. There is moderate  thickening of the mitral valve leaflet(s). There is mild calcification of  the mitral valve leaflet(s extending into the subvalvular apparatus.  4. The aortic valve is tricuspid. Aortic valve regurgitation is not  visualized. Mild to moderate aortic valve sclerosis/calcification is  present, without any evidence of aortic stenosis.  5. The inferior vena cava is normal in size with <50% respiratory  variability, suggesting right atrial pressure of 8 mmHg.    Patient Profile     59 y.o. female with history of HTN, moderate mitral regurgitation, bipolar disorder, tobacco abuse, Hep C and recent finding of acute CHF and cardiomyopathy who was scheduled for an outpatient cardiac cath today but presented to the ED early this am with worsened chest pain. She was seen this week in our office by Dr. Gardiner Rhyme. Echo with LVEF=25-30% with mild to moderate MR. BNP elevated at the office visit. EKG in the ED this am  unchanged. Troponin negative. We moved her cath up to 7:30 am. Her cardiac cath showed normal coronary arteries but elevated right and left heart pressures. She is being admitted for diuresis with IV Lasix.  Assessment & Plan   1.  Chest pain/SOB No recurrence, likely secondary to acute systolic CHF  2.  Acute systolic CHF -recent 2D echo with severe LV dysfunction EF 25-30%>> nonischemic -normal coronary arteries on cath  -started on IV Lasix for elevated filling pressures with LVEDP 50mmHg, PCW mean 77mmHg and RVEDP 58mmHg.  CI 2.48. -she put out 1.2 L yesterday and is net neg 2.9 L for admission -weight 102.3 lbs today and was 110lbs in the office 06/22/2019 -she appears euvolemic on exam -she complains of dizziness but BP is stable - neurology evaluated, no acute findings. - Carvedilol 3.125mg  BID - Losartan 25mg  daily with plans to transition to [facility] as outpt - spiro 12.5mg  daily -change Lasix to 40mg  daily PO  -once HF meds titrated to maximally tolerated doses, then repeat 2D echo  3.  Hypokalemia - repeat labs pending today. - likely will need KCl 20 mEq homegoing.  4.  HTN -BP controlled -see above>>changing Toprol to Carvedilol and Lisinopril to Losartan as part of HF regimen -adding sprio  5.  Moderate MR -likely related to DCM -hopefully will improve on HF treatment  6.  Dizziness - neurology evaluated. Discussed with Dr. Leonel Ramsay this morning, no acute findings, follow up planned with neuro as outpatient.  Plan to DC home after labs rechecked today. Will have her followup with Dr. Gardiner Rhyme in 7-10 days and BMET in 1 week.  She was instructed to weigh her self daily and call if weight increases > 3lbs in 1 day or 5lbs in 1 week. Instructed on < 2gm Na diet and < 2L fluid daily.   For questions or updates, please contact [place] Please consult [email or web address] for contact info under        Signed, Elouise Munroe, MD  06/27/2019, 7:14 AM

## 2019-06-29 ENCOUNTER — Telehealth: Payer: Self-pay

## 2019-06-29 NOTE — Telephone Encounter (Signed)
Transition Care Management Follow-up Telephone Call Date of discharge and from where:06/27/2019, [facility].    Call placed to the patient # [phone], message left with call back requested to this CM

## 2019-06-30 ENCOUNTER — Telehealth: Payer: Self-pay | Admitting: Cardiology

## 2019-06-30 ENCOUNTER — Telehealth: Payer: Self-pay

## 2019-06-30 NOTE — Telephone Encounter (Signed)
New message:    Pt said she had to go to the ER on  Thursday. She said she needs her medicine and they told her at the hospital to call and see if Dr Gennette Pac office could help her get it. She ssiad she contacted Orthoptist and they said they could not help her at this time.

## 2019-06-30 NOTE — Telephone Encounter (Signed)
Pt called to report that she cannot afford any of the RX that she was sent home with from the hospital 06/27/19... she has not taken any meds since then.. she says she can barely afford to "eat".   She cannot get her Aldactone, Cozaar, Lasix, or her Coreg.   She says there is not a Education officer, museum working with her and "social services" did not offer her any help.   [facility] and left a message for their Financial help dept. To see if she qualifies. I asked for them to please call us back with information to help the pt.     Next OV 07/06/19 with Dr. Gardiner Rhyme

## 2019-06-30 NOTE — Telephone Encounter (Signed)
Transition Care Management Follow-up Telephone Call  Date of discharge and from where: 06/27/2019, [facility]   How have you been since you were released from the hospital? She said she is " doing fine."   Any questions or concerns? She said she is not able to afford her medications. She said that she has all of her "old" medications but not the new ones - carvedilol, furosemide, losartan and spironolactone. She stated that she understands she is to stop taking the lisinopril and metoprolol. She also said [name], LCSW/HF clinic is helping her with the cost of the meds.   This CM sent message to [facility] regarding assistance with the medications.   She is uninsured and said that the hospital helped her fill out a mediciad application. She is not eligible for the [employer] or [employer]  with a pending medicaid application.  She can apply for a Blue Card to assist with drug costs at [place].    Items Reviewed:  Did the pt receive and understand the discharge instructions provided?  yes she has the instructions and said that she does not have any questions.   Medications obtained and verified?  please see above questions/concerns.   Any new allergies since your discharge?   None reported   Do you have support at home? she said she has a friend who helps her if needed  Other (ie: DME, Home Health, etc) no home health or DME ordered.   Has a scale. She said that her weight remains " the same" 109 lbs  Functional Questionnaire: (I = Independent and D = Dependent) ADL's: independent   Follow up appointments reviewed:    [facility] Hospital f/u appt confirmed? She needs to schedule a follow up appointment but said she was in the tub and needed to end the call.   [facility] f/u appt confirmed? appointment with cardiology 07/03/2019 and 07/06/2019.   Are transportation arrangements needed? she said that [name], LCSW arranges her rides to  appointments.   If their condition worsens, is the pt aware to call  their PCP or go to the ED?  yes  Was the patient provided with contact information for the PCP's office or ED?  she has the phone number for the clinic on her AVS  Was the pt encouraged to call back with questions or concerns?  yes

## 2019-06-30 NOTE — Telephone Encounter (Signed)
Left message for patient to call back  

## 2019-07-01 MED FILL — ?CARVEDILOL 3.125 MG TABLET: 3.125 | 30 days supply | Qty: 60 | Fill #0

## 2019-07-01 MED FILL — ?LOSARTAN POTASSIUM 25MG: 25 | 30 days supply | Qty: 30 | Fill #0

## 2019-07-01 MED FILL — ?SPIRONOLACTONE 25 MG TABLE: 25 | 30 days supply | Qty: 15 | Fill #0

## 2019-07-01 MED FILL — ?FUROSEMIDE 40 MG TABLET: 40 | 30 days supply | Qty: 30 | Fill #0

## 2019-07-01 NOTE — Telephone Encounter (Signed)
CSW received referral to assist patient with her medications. CSW assisted patient prior to hospitalization with some of her medications although post hospitalization she was prescribed added meds. CSW coordinated with Eden Lathe, RN at [facility] to obtain medications through the [place] and transportation to upcoming appointments. CSW contacted patient and informed that Opal Sidles has made arrangements for meds at the pharmacy and they will be ready this afternoon. Patient reports she will have a ride to the pharmacy in the morning and will pick up then, CSW informed of upcoming appointments and will make transport arrangements through [facility] for both [place] as well as [facility] appointment this coming Monday. Patient verbalized understanding and will follow up. Patient has CSW contact information for further needs. CSW will follow up tomorrow with patient to confirm med pick up and transport arrangements. [name], [place], [place]

## 2019-07-01 NOTE — Telephone Encounter (Signed)
Call received from [name], LCSW/HF clinic. She explained that the Heart Failure clinic paid for patient's medications once prior to her hospitalization.  She inquired about having them filled at [place].  Call placed to the patient. Explained that the HF clinic was only paying for her medications prior to her hospitalization and asked if she would  like them filled at [place].   The [facility] may be able to provide sample meds or free/reduced cost medications through patient assistance programs.  The patient said that she took her prescriptions for her medications to [place] and she was in agreement to having them transferred to [facility].  She said that she only needed the 4 new medications: carvedilol, furosemide, losartan and spironolactone.   She scheduled an appointment with Dr Wynetta Emery  - 07/13/2019.    Call placed to [name], LCSW and informed her that per Satya,RPH/[facility] the medications will be ready for pick up at [facility]. Cost $0.  The patient will need to complete the application for [facility] for the pharmacy and apply for a [employer] with the [facility]. Kennyth Lose said that she would notify the patient about the medications.

## 2019-07-02 ENCOUNTER — Telehealth: Payer: Self-pay | Admitting: Licensed Clinical Social Worker

## 2019-07-02 NOTE — Telephone Encounter (Signed)
CSW contacted patient to confirm she picked up her medications at the [place]. Patient confirmed all medications in home. CSW made arrangements for transportation to [facility] appointment for monday. Patient grateful for the assistance and will contact CSW as needed going forward. [name], [place], [place]
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
## 2019-07-06 ENCOUNTER — Ambulatory Visit (INDEPENDENT_AMBULATORY_CARE_PROVIDER_SITE_OTHER): Payer: Self-pay | Admitting: Cardiology

## 2019-07-06 ENCOUNTER — Encounter: Payer: Self-pay | Admitting: Cardiology

## 2019-07-06 ENCOUNTER — Other Ambulatory Visit: Payer: Self-pay

## 2019-07-06 VITALS — BP 120/76 | HR 67 | Temp 96.8°F | Ht 61.0 in | Wt 102.0 lb

## 2019-07-06 DIAGNOSIS — R072 Precordial pain: Secondary | ICD-10-CM

## 2019-07-06 DIAGNOSIS — I429 Cardiomyopathy, unspecified: Secondary | ICD-10-CM

## 2019-07-06 DIAGNOSIS — R079 Chest pain, unspecified: Secondary | ICD-10-CM

## 2019-07-06 DIAGNOSIS — R002 Palpitations: Secondary | ICD-10-CM

## 2019-07-06 DIAGNOSIS — Z720 Tobacco use: Secondary | ICD-10-CM

## 2019-07-06 DIAGNOSIS — I10 Essential (primary) hypertension: Secondary | ICD-10-CM

## 2019-07-06 DIAGNOSIS — I340 Nonrheumatic mitral (valve) insufficiency: Secondary | ICD-10-CM

## 2019-07-06 LAB — BASIC METABOLIC PANEL
BUN/Creatinine Ratio: 7 — ABNORMAL LOW (ref 12–28)
BUN: 6 mg/dL — ABNORMAL LOW (ref 8–27)
CO2: 22 mmol/L (ref 20–29)
Calcium: 9.6 mg/dL (ref 8.7–10.3)
Chloride: 104 mmol/L (ref 96–106)
Creatinine, Ser: 0.82 mg/dL (ref 0.57–1.00)
GFR calc Af Amer: 90 mL/min/{1.73_m2} (ref >59)
GFR calc non Af Amer: 78 mL/min/{1.73_m2} (ref >59)
Glucose: 77 mg/dL (ref 65–99)
Potassium: 5.1 mmol/L (ref 3.5–5.2)
Sodium: 139 mmol/L (ref 134–144)

## 2019-07-06 LAB — MAGNESIUM: Magnesium: 2.2 mg/dL (ref 1.6–2.3)

## 2019-07-06 NOTE — Patient Instructions (Signed)
Medication Instructions:  Continue same medications    Lab Work: Bmet,magnesium today    Testing/Procedures: Schedule Cardiac MRI   Follow-Up: At [facility], you and your health needs are our priority.  As part of our continuing mission to provide you with exceptional heart care, we have created designated Provider Care Teams.  These Care Teams include your primary Cardiologist (physician) and Advanced Practice Providers (APPs -  Physician Assistants and Nurse Practitioners) who all work together to provide you with the care you need, when you need it.  We recommend signing up for the patient portal called "MyChart".  Sign up information is provided on this After Visit Summary.  MyChart is used to connect with patients for Virtual Visits (Telemedicine).  Patients are able to view lab/test results, encounter notes, upcoming appointments, etc.  Non-urgent messages can be sent to your provider as well.   To learn more about what you can do with MyChart, go to [email or web address].    Your next appointment: 2 months   The format for your next appointment: Office   Provider: Dr.Schumann     Schedule appointment with Pharmacy for titration of heart failure medications

## 2019-07-10 ENCOUNTER — Other Ambulatory Visit: Payer: Self-pay | Admitting: *Deleted

## 2019-07-10 ENCOUNTER — Telehealth: Payer: Self-pay | Admitting: Cardiology

## 2019-07-10 MED ORDER — CARVEDILOL 6.25 MG PO TABS: 6.2500 mg | ORAL_TABLET | Freq: Two times a day (BID) | ORAL | 11 refills | Status: DC

## 2019-07-10 NOTE — Telephone Encounter (Signed)
New Message    Pt is returning call for results    Please call

## 2019-07-10 NOTE — Telephone Encounter (Signed)
Left detailed message w/results

## 2019-07-10 NOTE — Telephone Encounter (Signed)
Donato Heinz, MD  07/07/2019  8:45 AM EDT    Stable renal function.  Potassium is 5.1, will hold off on starting Entresto at this time.  Will instead titrate up carvedilol dose, recommend increase to 6.25 mg twice daily

## 2019-07-13 ENCOUNTER — Ambulatory Visit: Payer: Self-pay | Admitting: Internal Medicine

## 2019-07-14 ENCOUNTER — Telehealth: Payer: Self-pay | Admitting: Licensed Clinical Social Worker

## 2019-07-14 ENCOUNTER — Ambulatory Visit: Payer: Self-pay | Admitting: Internal Medicine

## 2019-07-14 NOTE — Telephone Encounter (Signed)
CSW received message from patient stating that she needs a ride to [facility] end of the week. CSW returned call and will make arrangements for transport. [name], [place], [place]

## 2019-07-15 ENCOUNTER — Telehealth: Payer: Self-pay | Admitting: Licensed Clinical Social Worker

## 2019-07-15 NOTE — Telephone Encounter (Signed)
CSW spoke with patient and confirmed arrangements for transport to appointment at [facility] for tomorrow morning. Patient grateful for the assistance. [name], [place], [place]

## 2019-07-16 ENCOUNTER — Encounter: Payer: Self-pay | Admitting: Internal Medicine

## 2019-07-16 ENCOUNTER — Ambulatory Visit: Payer: Self-pay | Attending: Internal Medicine | Admitting: Internal Medicine

## 2019-07-16 ENCOUNTER — Other Ambulatory Visit: Payer: Self-pay | Admitting: Family

## 2019-07-16 ENCOUNTER — Other Ambulatory Visit: Payer: Self-pay

## 2019-07-16 VITALS — BP 126/84 | HR 82 | Temp 97.2°F | Resp 16 | Wt 105.0 lb

## 2019-07-16 DIAGNOSIS — B182 Chronic viral hepatitis C: Secondary | ICD-10-CM

## 2019-07-16 DIAGNOSIS — I10 Essential (primary) hypertension: Secondary | ICD-10-CM

## 2019-07-16 DIAGNOSIS — R1312 Dysphagia, oropharyngeal phase: Secondary | ICD-10-CM

## 2019-07-16 DIAGNOSIS — Z800 Family history of malignant neoplasm of digestive organs: Secondary | ICD-10-CM

## 2019-07-16 DIAGNOSIS — Z09 Encounter for follow-up examination after completed treatment for conditions other than malignant neoplasm: Secondary | ICD-10-CM

## 2019-07-16 DIAGNOSIS — F319 Bipolar disorder, unspecified: Secondary | ICD-10-CM

## 2019-07-16 DIAGNOSIS — R634 Abnormal weight loss: Secondary | ICD-10-CM

## 2019-07-16 DIAGNOSIS — I5042 Chronic combined systolic (congestive) and diastolic (congestive) heart failure: Secondary | ICD-10-CM

## 2019-07-16 DIAGNOSIS — Z1211 Encounter for screening for malignant neoplasm of colon: Secondary | ICD-10-CM

## 2019-07-16 DIAGNOSIS — F17200 Nicotine dependence, unspecified, uncomplicated: Secondary | ICD-10-CM

## 2019-07-16 DIAGNOSIS — I428 Other cardiomyopathies: Secondary | ICD-10-CM

## 2019-07-16 MED ORDER — HARVONI 45-200 MG PO TABS: 1.0000 | ORAL_TABLET | Freq: Every day | ORAL | 1 refills | Status: DC
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
## 2019-07-17 LAB — TSH+T4F+T3FREE
Free T4: 1.17 ng/dL (ref 0.82–1.77)
T3, Free: 2.1 pg/mL (ref 2.0–4.4)
TSH: 0.553 u[IU]/mL (ref 0.450–4.500)

## 2019-07-17 LAB — HIV ANTIBODY (ROUTINE TESTING W REFLEX): HIV Screen 4th Generation wRfx: NONREACTIVE

## 2019-07-20 ENCOUNTER — Telehealth: Payer: Self-pay

## 2019-07-20 NOTE — Telephone Encounter (Signed)
Contacted pt to go over lab results pt is aware and doesn't have any questions or concerns 

## 2019-07-21 ENCOUNTER — Telehealth: Payer: Self-pay | Admitting: Licensed Clinical Social Worker

## 2019-07-21 NOTE — Progress Notes (Signed)
CSW made arrangement with [facility] [phone] for transport to [facility] office for appointment on Thursday July 23, 2019. Patient grateful for the assistance. [name], [place], [place]

## 2019-07-22 ENCOUNTER — Telehealth: Payer: Self-pay | Admitting: Pharmacy Technician

## 2019-07-22 NOTE — Telephone Encounter (Addendum)
[facility] Patient Advocate Encounter  Application for Harvoni was completed and faxed to [employer] for determination. The patient was previously on Eplcusa but decided to quit taking because the tablet was too large to swallow. She prefers ship to home. [place] is currently pending, application sent in February. She was unaware of an update on the medicaid status.   Will follow-up with patient once we receive a denial or approval.   [place] called back and needed more information on her application for re-enrollment into the program. This has been signed by the provider and faxed back.

## 2019-07-23 ENCOUNTER — Ambulatory Visit: Payer: Self-pay

## 2019-07-23 NOTE — Progress Notes (Deleted)
     07/23/2019 Alexandria Buck [birth year] [id number]   HPI:  Alexandria Buck is a 59 y.o. female patient of Dr Gardiner Rhyme, with a PMH below who presents today for heart failure medication titration.  Patient was seen by Dr. Gardiner Rhyme on March 29 after a February hospitalization (for n/v, dizziness) found her to have an EF of 25-30%, global hypokinesis, grade 2 diastolic dysfunction, mild RV systolic dysfunction and mild to moderate mitral regurgitation.  She was started on lisinopril 10 mg and metoprolol succ 25mg , both once daily.  She was then re-admitted a month later with an episode of chest pain.  Heart cath was unremarkable and it was felt the chest pain was due to fluid overload   Past Medical History: hypertension   Bipolar disorder   Tobacco abuse   Hepatitis C         Blood Pressure Goal:  130/80  Current Medications:  Family Hx:  Social Hx:  Diet:  Exercise:  Home BP readings:  Intolerances:   Labs:  Wt Readings from Last 3 Encounters:  07/16/19 105 lb (47.6 kg)  07/06/19 102 lb (46.3 kg)  06/27/19 102 lb 8.2 oz (46.5 kg)   BP Readings from Last 3 Encounters:  07/16/19 126/84  07/06/19 120/76  06/27/19 116/83   Pulse Readings from Last 3 Encounters:  07/16/19 82  07/06/19 67  06/27/19 74    Current Outpatient Medications  Medication Sig Dispense Refill  . carvedilol (COREG) 6.25 MG tablet Take 1 tablet (6.25 mg total) by mouth 2 (two) times daily with a meal. 60 tablet 11  . furosemide (LASIX) 40 MG tablet Take 1 tablet (40 mg total) by mouth daily. 30 tablet 6  . Ledipasvir-Sofosbuvir (HARVONI) 45-200 MG TABS Take 1 tablet by mouth daily. Take 1 tablet by mouth daily. 28 tablet 1  . lithium carbonate 150 MG capsule Take 150 mg by mouth 2 (two) times daily with a meal.    . losartan (COZAAR) 25 MG tablet Take 1 tablet (25 mg total) by mouth daily. Start this 06/28/19 30 tablet 6  . nitroGLYCERIN (NITROSTAT) 0.4 MG SL tablet Place 1 tablet (0.4 mg  total) under the tongue every 5 (five) minutes as needed for chest pain. 25 tablet 3  . PARoxetine (PAXIL) 10 MG tablet Take 10 mg by mouth at bedtime.    . risperiDONE (RISPERDAL) 1 MG tablet Take 1 mg by mouth at bedtime.    Marland Kitchen spironolactone (ALDACTONE) 25 MG tablet Take 0.5 tablets (12.5 mg total) by mouth daily. 30 tablet 6   No current facility-administered medications for this visit.    No Known Allergies  Past Medical History:  Diagnosis Date  . Acute systolic CHF (congestive heart failure) ([place])   . Alcohol abuse   . Bipolar disorder ([place])   . Depression   . Hepatitis C   . Hypertension    hx-not taking meds  . NICM (nonischemic cardiomyopathy) ([place])   . Pneumonia    had 5/12    There were no vitals taken for this visit.  No problem-specific Assessment & Plan notes found for this encounter.   Tommy Medal PharmD CPP [facility] [address] [place], [place] [address] [phone]

## 2019-07-30 ENCOUNTER — Encounter: Payer: Self-pay | Admitting: Nurse Practitioner

## 2019-08-03 NOTE — Telephone Encounter (Signed)
[facility] Patient Advocate Encounter  Patient approved to receive Harvoni at no charge from [employer]. Approval date range 07/30/2019 to 09/24/2019. She requested it to be sent to home address so the company will reach out to her for first shipment. She indicated if the tablet is too big, she won't take the treatment.

## 2019-08-05 ENCOUNTER — Telehealth: Payer: Self-pay | Admitting: Licensed Clinical Social Worker

## 2019-08-05 NOTE — Telephone Encounter (Signed)
[facility]ID Patient Advocate Encounter  Spoke with [facility] and let them know patient prefers having medication shipped to her home.  They are reaching out to her to set up shipment.  Alexandria Buck. [name] Patient [facility] Phone: [phone] Fax:  [phone]

## 2019-08-05 NOTE — Telephone Encounter (Signed)
CSW received call form patient requesting assistance with transportation to Neuro appointment next week. CSW provided patient with bus passes and the address to her appointment. Patient grateful for the assistance. [name], [place], [place]

## 2019-08-10 ENCOUNTER — Encounter: Payer: Self-pay | Admitting: Neurology

## 2019-08-10 ENCOUNTER — Ambulatory Visit: Payer: Self-pay | Admitting: Neurology

## 2019-08-10 ENCOUNTER — Telehealth: Payer: Self-pay

## 2019-08-10 ENCOUNTER — Telehealth: Payer: Self-pay | Admitting: Licensed Clinical Social Worker
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
## 2019-08-10 NOTE — Telephone Encounter (Signed)
Pt no showed 08/10/19 appt with Dr. Rexene Alberts.

## 2019-08-12 ENCOUNTER — Ambulatory Visit: Payer: Self-pay | Admitting: Nurse Practitioner

## 2019-08-19 ENCOUNTER — Other Ambulatory Visit: Payer: Self-pay | Admitting: Pharmacist

## 2019-09-02 ENCOUNTER — Telehealth: Payer: Self-pay | Admitting: Pharmacy Technician

## 2019-09-02 ENCOUNTER — Telehealth: Payer: Self-pay | Admitting: Pharmacist

## 2019-09-02 ENCOUNTER — Other Ambulatory Visit: Payer: Self-pay | Admitting: Family

## 2019-09-02 ENCOUNTER — Other Ambulatory Visit: Payer: Self-pay | Admitting: Pharmacist

## 2019-09-02 DIAGNOSIS — B182 Chronic viral hepatitis C: Secondary | ICD-10-CM

## 2019-09-02 MED ORDER — LEDIPASVIR-SOFOSBUVIR 90-400 MG PO TABS: 1.0000 | ORAL_TABLET | Freq: Every day | ORAL | 1 refills | Status: DC

## 2019-09-02 NOTE — Progress Notes (Signed)
ni

## 2019-09-02 NOTE — Telephone Encounter (Cosign Needed)
I spoke with Alexandria Buck this morning about her new medication Harvoni. I informed her that Alexandria Buck has been approved and will be mailed to her home and will need to call Alexandria Buck once she receives it. She will take Harvoni 1 tablet once daily for 8 weeks. She has swallowing issues, so she will be crushing her tablets. Normally, Harvoni should be swallowed whole and not crushed, however patient has swallowing issues and will only crush her tablets and mix it with applesauce. Applesauce provides an acidic environment and should be fine with Harvoni.   Discussed the importance of medication adherence and not missing any doses in order to achieve SVR. Advised her to take Harvoni around the same time everyday and not to take any extra doses (more than 1 tablet). Counseled patient on potential side-effects including fatigue, headache and nausea and advised her to take Alexandria Buck with food to help with nausea. Explained that if she needs to take antacids/Tums in the future, she will need to separate Harvoni by 4 hours before or after. Also instructed patient to call Alexandria Buck if she starts any new medications. Patient verbalized understanding. All of her questions and concerns were addressed at this time. Will plan for a 2 week follow-up once she contacts Alexandria Buck letting her know she has received Harvoni.  Alexandria Buck, PharmD PGY1 Ambulatory Care Resident

## 2019-09-02 NOTE — Telephone Encounter (Signed)
[facility] Patient Advocate Encounter   Patient has been approved for [employer] Patient Assistance Program for Harvoni from 09/02/2019 to 10/28/2019. This assistance will make the patient's medication free of charge.  I have spoken with the patient and they know to call our office when they receive the medication and that we will need a follow-up appointment once she begins medication.  [facility] pharmacy will reach out to confirm shipment once the new script is received.   Patient knows to call the office with questions or concerns.  Bartholomew Crews, CPhT Specialty Pharmacy Patient [facility] Phone: [phone] Fax: [phone] 09/02/2019 10:37 AM

## 2019-09-04 ENCOUNTER — Telehealth: Payer: Self-pay | Admitting: Internal Medicine

## 2019-09-04 NOTE — Telephone Encounter (Signed)
Patient wants to talk to Dr Wynetta Emery nurse regarding  Her rx  Because she said that her Heart Dr put her on Steroids and she has an appt with Dr Wynetta Emery  6-3 but she need to talk to you before that  . Please, call her   Thank you

## 2019-09-08 NOTE — Telephone Encounter (Signed)
[facility] Patient Advocate Encounter  Spoke with [facility] pharmacy and [facility] today to get an update on the patient's medication. She remains approved in [employer] but was made inactive with [facility] due to all the changes.   Moving forward, [facility] will only communicate with the clinic and ship to the clinic and bypass communicating with the patient directly. Once the medication arrives, we will call the patient to have her come by and pick up medication.

## 2019-09-09 NOTE — Telephone Encounter (Signed)
[facility] Patient Advocate Encounter  [facility] set up shipment to arrive Monday, June 7 to the clinic. We will call the patient when it arrives.

## 2019-09-09 NOTE — Telephone Encounter (Signed)
Returned pt call pt didn't answer lvm asking pt to give a call back at her earliest convenience

## 2019-09-10 ENCOUNTER — Other Ambulatory Visit: Payer: Self-pay

## 2019-09-10 ENCOUNTER — Ambulatory Visit: Payer: Self-pay | Attending: Internal Medicine | Admitting: Internal Medicine

## 2019-09-10 ENCOUNTER — Encounter: Payer: Self-pay | Admitting: Internal Medicine

## 2019-09-10 VITALS — BP 131/93 | HR 82 | Temp 97.9°F | Resp 16 | Wt 103.6 lb

## 2019-09-10 DIAGNOSIS — Z87440 Personal history of urinary (tract) infections: Secondary | ICD-10-CM | POA: Insufficient documentation

## 2019-09-10 DIAGNOSIS — F17210 Nicotine dependence, cigarettes, uncomplicated: Secondary | ICD-10-CM | POA: Insufficient documentation

## 2019-09-10 DIAGNOSIS — B182 Chronic viral hepatitis C: Secondary | ICD-10-CM | POA: Insufficient documentation

## 2019-09-10 DIAGNOSIS — K219 Gastro-esophageal reflux disease without esophagitis: Secondary | ICD-10-CM | POA: Insufficient documentation

## 2019-09-10 DIAGNOSIS — R9431 Abnormal electrocardiogram [ECG] [EKG]: Secondary | ICD-10-CM | POA: Insufficient documentation

## 2019-09-10 DIAGNOSIS — Z833 Family history of diabetes mellitus: Secondary | ICD-10-CM | POA: Insufficient documentation

## 2019-09-10 DIAGNOSIS — Z801 Family history of malignant neoplasm of trachea, bronchus and lung: Secondary | ICD-10-CM | POA: Insufficient documentation

## 2019-09-10 DIAGNOSIS — Z1211 Encounter for screening for malignant neoplasm of colon: Secondary | ICD-10-CM

## 2019-09-10 DIAGNOSIS — Z1231 Encounter for screening mammogram for malignant neoplasm of breast: Secondary | ICD-10-CM

## 2019-09-10 DIAGNOSIS — Z79899 Other long term (current) drug therapy: Secondary | ICD-10-CM | POA: Insufficient documentation

## 2019-09-10 DIAGNOSIS — I110 Hypertensive heart disease with heart failure: Secondary | ICD-10-CM | POA: Insufficient documentation

## 2019-09-10 DIAGNOSIS — R634 Abnormal weight loss: Secondary | ICD-10-CM | POA: Insufficient documentation

## 2019-09-10 DIAGNOSIS — R1312 Dysphagia, oropharyngeal phase: Secondary | ICD-10-CM | POA: Insufficient documentation

## 2019-09-10 DIAGNOSIS — K5900 Constipation, unspecified: Secondary | ICD-10-CM | POA: Insufficient documentation

## 2019-09-10 DIAGNOSIS — I5042 Chronic combined systolic (congestive) and diastolic (congestive) heart failure: Secondary | ICD-10-CM | POA: Insufficient documentation

## 2019-09-10 DIAGNOSIS — I428 Other cardiomyopathies: Secondary | ICD-10-CM | POA: Insufficient documentation

## 2019-09-10 DIAGNOSIS — Z681 Body mass index (BMI) 19 or less, adult: Secondary | ICD-10-CM | POA: Insufficient documentation

## 2019-09-10 DIAGNOSIS — F319 Bipolar disorder, unspecified: Secondary | ICD-10-CM | POA: Insufficient documentation

## 2019-09-10 DIAGNOSIS — I10 Essential (primary) hypertension: Secondary | ICD-10-CM

## 2019-09-10 DIAGNOSIS — Z800 Family history of malignant neoplasm of digestive organs: Secondary | ICD-10-CM | POA: Insufficient documentation

## 2019-09-10 MED ORDER — LOSARTAN POTASSIUM 25 MG PO TABS: 25.0000 mg | ORAL_TABLET | Freq: Every day | ORAL | 6 refills | Status: DC

## 2019-09-10 MED ORDER — FAMOTIDINE 20 MG PO TABS: 20.0000 mg | ORAL_TABLET | Freq: Two times a day (BID) | ORAL | 3 refills | Status: DC

## 2019-09-10 MED ORDER — POLYETHYLENE GLYCOL 3350 17 GM/SCOOP PO POWD: 17.0000 g | Freq: Every day | ORAL | 1 refills | Status: DC | PRN

## 2019-09-10 MED ORDER — SPIRONOLACTONE 25 MG PO TABS: 12.5000 mg | ORAL_TABLET | Freq: Every day | ORAL | 6 refills | Status: DC

## 2019-09-10 MED ORDER — FUROSEMIDE 40 MG PO TABS: 40.0000 mg | ORAL_TABLET | Freq: Every day | ORAL | 6 refills | Status: DC

## 2019-09-10 MED ORDER — CARVEDILOL 6.25 MG PO TABS: 6.2500 mg | ORAL_TABLET | Freq: Two times a day (BID) | ORAL | 11 refills | Status: DC

## 2019-09-10 MED FILL — ?CARVEDILOL 6.25 MG TABLET: 6.25 | 30 days supply | Qty: 60 | Fill #0

## 2019-09-10 MED FILL — POLYETHYLENE GLYCOL 3350 PO: 17 | 14 days supply | Qty: 238 | Fill #0

## 2019-09-10 MED FILL — ?SPIRONOLACTONE 25 MG TABLE: 25 | 30 days supply | Qty: 15 | Fill #0

## 2019-09-10 MED FILL — LOSARTAN POTASSIUM 25 MG TA: 25 | 30 days supply | Qty: 30 | Fill #0

## 2019-09-10 MED FILL — ?FUROSEMIDE 4OMG TABLETS: 40 | 30 days supply | Qty: 30 | Fill #0

## 2019-09-10 NOTE — Patient Instructions (Signed)
Please call the BCCCP (breast and cervical cancer control program) at [phone] to schedule diagnostic mammogram  

## 2019-09-10 NOTE — Progress Notes (Signed)
Pt states she has been out of her meds for 3-4 days   Pt states she is having tingling and numbness b/l sides  Pt states she is having sob  Pt states she feels like her chest is going to pop out
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
## 2019-09-11 ENCOUNTER — Telehealth: Payer: Self-pay

## 2019-09-11 ENCOUNTER — Telehealth: Payer: Self-pay | Admitting: Pharmacy Technician

## 2019-09-11 LAB — COMPREHENSIVE METABOLIC PANEL
ALT: 25 IU/L (ref 0–32)
AST: 57 IU/L — ABNORMAL HIGH (ref 0–40)
Albumin/Globulin Ratio: 0.7 — ABNORMAL LOW (ref 1.2–2.2)
Albumin: 3.8 g/dL (ref 3.8–4.9)
Alkaline Phosphatase: 102 IU/L (ref 48–121)
BUN/Creatinine Ratio: 5 — ABNORMAL LOW (ref 12–28)
BUN: 4 mg/dL — ABNORMAL LOW (ref 8–27)
Bilirubin Total: 0.4 mg/dL (ref 0.0–1.2)
CO2: 24 mmol/L (ref 20–29)
Calcium: 8.6 mg/dL — ABNORMAL LOW (ref 8.7–10.3)
Chloride: 101 mmol/L (ref 96–106)
Creatinine, Ser: 0.8 mg/dL (ref 0.57–1.00)
GFR calc Af Amer: 93 mL/min/{1.73_m2} (ref >59)
GFR calc non Af Amer: 80 mL/min/{1.73_m2} (ref >59)
Globulin, Total: 5.4 g/dL — ABNORMAL HIGH (ref 1.5–4.5)
Glucose: 88 mg/dL (ref 65–99)
Potassium: 4 mmol/L (ref 3.5–5.2)
Sodium: 137 mmol/L (ref 134–144)
Total Protein: 9.2 g/dL — ABNORMAL HIGH (ref 6.0–8.5)

## 2019-09-11 LAB — LIPASE: Lipase: 21 U/L (ref 14–72)

## 2019-09-11 MED FILL — FAMOTIDINE 20 MG TABS: 20 | 30 days supply | Qty: 60 | Fill #0

## 2019-09-11 NOTE — Telephone Encounter (Addendum)
[facility] Patient Advocate Encounter  Patient's Harvoni medication arrived to the clinic today from [facility]. Patient is aware and will pick up Monday. Currently being stored in the pharmacy cabinet. Patient will need to be counseled on crushing the tablet since she has a hard time swallowing tablets.   Patient did not show Monday, she said it would be some time this week. A start date and appointment will need to be made when she shows.

## 2019-09-11 NOTE — Telephone Encounter (Signed)
Patient name and DOB has been verified Patient was informed of lab results. Patient had no questions.  

## 2019-09-11 NOTE — Telephone Encounter (Signed)
-----   Message from Ladell Pier, MD sent at 09/11/2019  8:10 AM EDT ----- Let patient know that her kidney function is okay.  She has mild elevation in one of her liver enzymes which may be due to the fact that she has hepatitis C.

## 2019-09-15 ENCOUNTER — Telehealth: Payer: Self-pay

## 2019-09-15 NOTE — Telephone Encounter (Signed)
Call placed to patient at request of Dr Wynetta Emery.  She said that she was denied medicaid and also noted that her income is $1300/month so she is likely over income.  She also has questions about SSI and a widow's check. .  She was in agreement to making a referral to [employer] [facility] [place] to determine if they are able to assist her with the medicaid denial,insurance and SSI benefit issues.   Legal Aid referral sent SECURE to attention of Merrily Pew

## 2019-09-16 ENCOUNTER — Ambulatory Visit (HOSPITAL_COMMUNITY): Payer: Self-pay

## 2019-09-17 ENCOUNTER — Ambulatory Visit: Payer: Self-pay | Admitting: Cardiology

## 2019-09-17 ENCOUNTER — Ambulatory Visit: Payer: Self-pay

## 2019-09-21 ENCOUNTER — Telehealth: Payer: Self-pay | Admitting: Cardiology

## 2019-09-21 NOTE — Telephone Encounter (Signed)
I attempted to contact patient on 09/21/19 to schedule appointment from patients referral list. The patient didn't answer so I left message for patient to return call to get that appointment scheduled.

## 2019-09-23 ENCOUNTER — Other Ambulatory Visit: Payer: Self-pay

## 2019-09-23 ENCOUNTER — Encounter: Payer: Self-pay | Admitting: Cardiology

## 2019-09-23 ENCOUNTER — Ambulatory Visit (INDEPENDENT_AMBULATORY_CARE_PROVIDER_SITE_OTHER): Payer: Self-pay | Admitting: Cardiology

## 2019-09-23 VITALS — BP 120/86 | HR 82 | Ht 61.0 in | Wt 100.4 lb

## 2019-09-23 DIAGNOSIS — I10 Essential (primary) hypertension: Secondary | ICD-10-CM

## 2019-09-23 DIAGNOSIS — I340 Nonrheumatic mitral (valve) insufficiency: Secondary | ICD-10-CM

## 2019-09-23 DIAGNOSIS — R002 Palpitations: Secondary | ICD-10-CM

## 2019-09-23 DIAGNOSIS — Z720 Tobacco use: Secondary | ICD-10-CM

## 2019-09-23 DIAGNOSIS — I5042 Chronic combined systolic (congestive) and diastolic (congestive) heart failure: Secondary | ICD-10-CM

## 2019-09-23 NOTE — Patient Instructions (Signed)
Medication Instructions:  Your physician recommends that you continue on your current medications as directed. Please refer to the Current Medication list given to you today.  *If you need a refill on your cardiac medications before your next appointment, please call your pharmacy*   Lab Work: BMET, Mag today  If you have labs (blood work) drawn today and your tests are completely normal, you will receive your results only by: . MyChart Message (if you have MyChart) OR . A paper copy in the mail If you have any lab test that is abnormal or we need to change your treatment, we will call you to review the results.   Follow-Up: At [facility], you and your health needs are our priority.  As part of our continuing mission to provide you with exceptional heart care, we have created designated Provider Care Teams.  These Care Teams include your primary Cardiologist (physician) and Advanced Practice Providers (APPs -  Physician Assistants and Nurse Practitioners) who all work together to provide you with the care you need, when you need it.  We recommend signing up for the patient portal called "MyChart".  Sign up information is provided on this After Visit Summary.  MyChart is used to connect with patients for Virtual Visits (Telemedicine).  Patients are able to view lab/test results, encounter notes, upcoming appointments, etc.  Non-urgent messages can be sent to your provider as well.   To learn more about what you can do with MyChart, go to [email or web address].    Your next appointment:   2 weeks with pharmacist (med titration)  2 months with Dr. Schumann   
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
## 2019-09-24 LAB — BASIC METABOLIC PANEL
BUN/Creatinine Ratio: 8 — ABNORMAL LOW (ref 12–28)
BUN: 9 mg/dL (ref 8–27)
CO2: 21 mmol/L (ref 20–29)
Calcium: 9.6 mg/dL (ref 8.7–10.3)
Chloride: 93 mmol/L — ABNORMAL LOW (ref 96–106)
Creatinine, Ser: 1.2 mg/dL — ABNORMAL HIGH (ref 0.57–1.00)
GFR calc Af Amer: 57 mL/min/{1.73_m2} — ABNORMAL LOW (ref >59)
GFR calc non Af Amer: 49 mL/min/{1.73_m2} — ABNORMAL LOW (ref >59)
Glucose: 75 mg/dL (ref 65–99)
Potassium: 4.8 mmol/L (ref 3.5–5.2)
Sodium: 131 mmol/L — ABNORMAL LOW (ref 134–144)

## 2019-09-24 LAB — MAGNESIUM: Magnesium: 1.8 mg/dL (ref 1.6–2.3)

## 2019-09-28 ENCOUNTER — Telehealth: Payer: Self-pay

## 2019-09-28 NOTE — Telephone Encounter (Signed)
Called patient to discuss status of Medicaid application. Called [employer] on behalf of patient. Status of application is still pending. Caseworker is Sharlet Salina [phone]). Patient will receive a call from caseworker with updates on application.  Care Guide will mail patient a [place] application in the meantime to apply. Patient was asked about affordability of medication. Medications are currently being purchased from the [facility]. Care Guide will look into other options for patient as well.  Care Guide will call patient back by 10/02/19 with updates and to check if the patient received application through the mail.

## 2019-09-29 ENCOUNTER — Other Ambulatory Visit: Payer: Self-pay | Admitting: *Deleted

## 2019-09-29 DIAGNOSIS — I5042 Chronic combined systolic (congestive) and diastolic (congestive) heart failure: Secondary | ICD-10-CM

## 2019-09-29 MED ORDER — FUROSEMIDE 40 MG PO TABS: 20.0000 mg | ORAL_TABLET | Freq: Every day | ORAL | 6 refills | Status: DC

## 2019-10-06 ENCOUNTER — Other Ambulatory Visit: Payer: Self-pay

## 2019-10-06 ENCOUNTER — Ambulatory Visit (INDEPENDENT_AMBULATORY_CARE_PROVIDER_SITE_OTHER): Payer: Self-pay | Admitting: Pharmacist

## 2019-10-06 VITALS — BP 123/85 | HR 91 | Wt 98.6 lb

## 2019-10-06 DIAGNOSIS — I5042 Chronic combined systolic (congestive) and diastolic (congestive) heart failure: Secondary | ICD-10-CM

## 2019-10-06 DIAGNOSIS — I5022 Chronic systolic (congestive) heart failure: Secondary | ICD-10-CM

## 2019-10-06 MED ORDER — SPIRONOLACTONE 25 MG PO TABS: 12.5000 mg | ORAL_TABLET | Freq: Every day | ORAL | 6 refills | Status: DC

## 2019-10-06 NOTE — Patient Instructions (Signed)
It was nice to meet you!  I will call you tomorrow with the results of your blood work. We will talk about what medication changes we can make.  Please call us at [phone] with any questions  Before checking your blood pressure make sure: You are seated and quite for 5 min before checking Feet are flat on the floor Siting in chair with your back supported straight up and down Arm resting on table or arm of chair at heart level Bladder is empty You have NOT had caffeine or tobacco within the last 30 min  Check your blood pressure 2-3 times about 1-2 min apart. Usually the first reading will be the highest. Record these readings.  Only check your blood pressure once a day, unless otherwise directed Record your blood pressure readings and bring them to all your appointments. If your meter stores your readings in its memory, then you may bring your blood pressure meter with you to your appointments.  You can find a list of validated (accurate) blood pressure cuffs at PopPath.it  Lifestyle changes can make a world of difference and are even more important than medications: Try to keep your sodium intake to 2300 mg of sodium per day Get 6-8 uninterrupted hours of sleep per night Aim for a goal of 150 min of moderate aerobic exercise (ie brisk walking, bike riding) per week

## 2019-10-06 NOTE — Progress Notes (Signed)
Patient ID: Alexandria Buck                 DOB: Dec 14, 1960                      MRN: [id number]     HPI: Alexandria Buck is a 59 y.o. female referred by Dr. Gardiner Buck to pharmacy clinic for HF medication management. PMH is significant for hypertension, bipolar disorder, tobacco use, hepatitis C and CHF. Most recent LVEF 30-30% on TEE.  Today she returns to pharmacy clinic for further medication titration. At last visit with MD her scr had bumped. She did not appear volume overloaded, therefore furosemide was held for 3 days and then resumed at a lower dose of 20mg  daily.   Tired when she does a lot a cleaning, picking up around the house, SOB  Symptomatically, she is feeling ok, denies dizziness, lightheadedness. But does have fatigue sometimes when she does a lot in a day. Denies chest pain or palpitations. Feels SOB when picking up around the hose. Able to complete all ADLs, but sometimes has to rest between tasks. Activity level fairly active, plays with her dog. She checks her weight at home (normal range 98 - 104 lbs). No LEE, PND, or orthopnea. Appetite has been good, ever now and then she doesn't have one. She adheres to a low-salt diet, uses a salt subsitute. Sleeps on 2 pillows.   Sometimes has trouble paying for her meds. Is out of her lithium and paxil. Almost out of spironolactone. [facility] but states she got a letter in the mail from the [facility] program that if she shows the letter at [facility] her medications would be free. Applying for medicare/medcaid with the help of social workers, Alexandria Buck and Alexandria Buck. Does not have a BP cuff at home.  Current CHF meds: carvedilol 6.25mg  BID, losartan 25mg  daily, furosemide 20mg  daily, spironolactone 12.5mg  daily Previously tried: lisinopril (changed to losartan) BP goal: <130/80  Family History: The patient's family history includes Diabetes in her mother; Lung cancer in her sister.  Social History: + tobacco (1  every 2 weeks), ETOH socially (only 1 drink), no illicit drugs  Diet: does use canned fruits No frozen dinners No deli meat  Exercise: playing with dog, walk dogs 5-10 min twice a day  Home BP readings: none  Wt Readings from Last 3 Encounters:  09/23/19 100 lb 6.4 oz (45.5 kg)  09/10/19 103 lb 9.6 oz (47 kg)  07/16/19 105 lb (47.6 kg)   BP Readings from Last 3 Encounters:  09/23/19 120/86  09/10/19 (!) 131/93  07/16/19 126/84   Pulse Readings from Last 3 Encounters:  09/23/19 82  09/10/19 82  07/16/19 82    Renal function: Estimated Creatinine Clearance: 35.8 mL/min (A) (by C-G formula based on SCr of 1.2 mg/dL (H)).  Past Medical History:  Diagnosis Date   Acute systolic CHF (congestive heart failure) ([facility])    Alcohol abuse    Bipolar disorder ([facility])    Depression    Hepatitis C    Hypertension    hx-not taking meds   NICM (nonischemic cardiomyopathy) ([place])    Pneumonia    had 5/12    Current Outpatient Medications on File Prior to Visit  Medication Sig Dispense Refill   carvedilol (COREG) 6.25 MG tablet Take 1 tablet (6.25 mg total) by mouth 2 (two) times daily with a meal. 60 tablet 11   famotidine (PEPCID) 20 MG tablet Take  1 tablet (20 mg total) by mouth 2 (two) times daily. 60 tablet 3   furosemide (LASIX) 40 MG tablet Take 0.5 tablets (20 mg total) by mouth daily. 30 tablet 6   Ledipasvir-Sofosbuvir (HARVONI) 90-400 MG TABS Take 1 tablet by mouth daily. 28 tablet 1   lithium carbonate 150 MG capsule Take 150 mg by mouth 2 (two) times daily with a meal.     losartan (COZAAR) 25 MG tablet Take 1 tablet (25 mg total) by mouth daily. Start this 06/28/19 30 tablet 6   nitroGLYCERIN (NITROSTAT) 0.4 MG SL tablet Place 1 tablet (0.4 mg total) under the tongue every 5 (five) minutes as needed for chest pain. 25 tablet 3   PARoxetine (PAXIL) 10 MG tablet Take 10 mg by mouth at bedtime.     polyethylene glycol powder (GLYCOLAX/MIRALAX) 17 GM/SCOOP  powder Take 17 g by mouth daily as needed for mild constipation. 3350 g 1   risperiDONE (RISPERDAL) 1 MG tablet Take 1 mg by mouth at bedtime.     spironolactone (ALDACTONE) 25 MG tablet Take 0.5 tablets (12.5 mg total) by mouth daily. 30 tablet 6   No current facility-administered medications on file prior to visit.    No Known Allergies   Assessment/Plan:  1. CHF - Blood pressure slightly above goal of <130/80 today. Plenty of room to titrate medications. Will get BMP today to see if scr improved and if we have room to increase losartan or to switch to [facility]. Patient would qualify for [employer] patient assistance while she does not have insurance. Also plenty of HR room to adjust carvedilol. Will wait for BMP results to see if we can titrate ARB/switch to [facility]. I will reach out to [place] and Amy to see how this [employer] letter works. Provided patient with blood pressure cuff to check blood pressure and taught her how to use it. I will call patient tomorrow with lab results and will schedule follow up visit at that time. Confirmed patient phone number. Patient out of 2 of her bipolar medications. I encouraged her to take the [facility] letter to [facility] and have her prescriptions transferred there to fill. Advised that it can be dangerous to be out of those medications.   Ramond Dial, Pharm.D, BCPS, CPP [place]  [address], [place], [place] [address]  Phone: [phone]; Fax: [phone]

## 2019-10-07 ENCOUNTER — Telehealth: Payer: Self-pay | Admitting: Pharmacist

## 2019-10-07 DIAGNOSIS — I5042 Chronic combined systolic (congestive) and diastolic (congestive) heart failure: Secondary | ICD-10-CM

## 2019-10-07 LAB — BASIC METABOLIC PANEL
BUN/Creatinine Ratio: 4 — ABNORMAL LOW (ref 12–28)
BUN: 3 mg/dL — ABNORMAL LOW (ref 8–27)
CO2: 22 mmol/L (ref 20–29)
Calcium: 9.3 mg/dL (ref 8.7–10.3)
Chloride: 99 mmol/L (ref 96–106)
Creatinine, Ser: 0.84 mg/dL (ref 0.57–1.00)
GFR calc Af Amer: 87 mL/min/{1.73_m2} (ref >59)
GFR calc non Af Amer: 76 mL/min/{1.73_m2} (ref >59)
Glucose: 77 mg/dL (ref 65–99)
Potassium: 4.9 mmol/L (ref 3.5–5.2)
Sodium: 133 mmol/L — ABNORMAL LOW (ref 134–144)

## 2019-10-07 LAB — MAGNESIUM: Magnesium: 2.2 mg/dL (ref 1.6–2.3)

## 2019-10-07 MED ORDER — CARVEDILOL 12.5 MG PO TABS: 12.5000 mg | ORAL_TABLET | Freq: Two times a day (BID) | ORAL | 11 refills | Status: DC

## 2019-10-07 MED ORDER — LOSARTAN POTASSIUM 25 MG PO TABS: 25.0000 mg | ORAL_TABLET | Freq: Every day | ORAL | 6 refills | Status: DC

## 2019-10-07 NOTE — Telephone Encounter (Signed)
Scr much improved on lower furosemide dose. Patient's medicaid application is delayed. Per patient, case worker told her it would be after the 4th of July. For now we will go ahead and increase carvedilol to 12.5mg  BID (patient was advised to take 2 of the 6.25mg  twice a day until she runs out). Follow up in clinic on 7/13. If she still has not heard from [facility] then I think it would be best if we went ahead and applied for patient assistance through [employer] for [employer]. We can give her a few weeks worth of samples at that time as well. I [facility] Rx [facility] per pt request. She was advised that if she had issues affording them, to give [place] a call back.

## 2019-10-07 NOTE — Telephone Encounter (Signed)
Sounds like a great plan, thanks Melissa!

## 2019-10-14 MED ORDER — FUROSEMIDE 40 MG PO TABS: 20.0000 mg | ORAL_TABLET | Freq: Every day | ORAL | 6 refills | Status: DC

## 2019-10-14 MED ORDER — SPIRONOLACTONE 25 MG PO TABS: 12.5000 mg | ORAL_TABLET | Freq: Every day | ORAL | 6 refills | Status: DC

## 2019-10-14 NOTE — Addendum Note (Signed)
Addended by: Marcelle Overlie D on: 10/14/2019 03:37 PM   Modules accepted: Orders

## 2019-10-14 NOTE — Telephone Encounter (Signed)
Patient called stating she was denied by medicare? I asked her to bring her letter with her to her next appointment. She also stated she [facility]. [facility]. Advised I cannot send her bipolar meds.Pt to follow up with me in office on 7/13

## 2019-10-20 ENCOUNTER — Telehealth: Payer: Self-pay | Admitting: Pharmacy Technician

## 2019-10-20 ENCOUNTER — Ambulatory Visit (INDEPENDENT_AMBULATORY_CARE_PROVIDER_SITE_OTHER): Payer: Self-pay | Admitting: Pharmacist

## 2019-10-20 ENCOUNTER — Other Ambulatory Visit: Payer: Self-pay | Admitting: Cardiology

## 2019-10-20 ENCOUNTER — Telehealth: Payer: Self-pay | Admitting: Pharmacist

## 2019-10-20 ENCOUNTER — Other Ambulatory Visit: Payer: Self-pay

## 2019-10-20 ENCOUNTER — Ambulatory Visit: Payer: Self-pay | Attending: Internal Medicine | Admitting: Internal Medicine

## 2019-10-20 ENCOUNTER — Telehealth: Payer: Self-pay

## 2019-10-20 VITALS — BP 122/78 | HR 73 | Wt 99.0 lb

## 2019-10-20 DIAGNOSIS — R634 Abnormal weight loss: Secondary | ICD-10-CM

## 2019-10-20 DIAGNOSIS — R1312 Dysphagia, oropharyngeal phase: Secondary | ICD-10-CM

## 2019-10-20 DIAGNOSIS — I5042 Chronic combined systolic (congestive) and diastolic (congestive) heart failure: Secondary | ICD-10-CM

## 2019-10-20 DIAGNOSIS — I5022 Chronic systolic (congestive) heart failure: Secondary | ICD-10-CM

## 2019-10-20 MED ORDER — CARVEDILOL 12.5 MG PO TABS: 12.5000 mg | ORAL_TABLET | Freq: Two times a day (BID) | ORAL | 3 refills | Status: DC

## 2019-10-20 MED ORDER — FUROSEMIDE 20 MG PO TABS
20.0000 mg | ORAL_TABLET | Freq: Every day | ORAL | 3 refills | Status: DC
Start: 2019-10-20 — End: 2019-12-31

## 2019-10-20 MED ORDER — SPIRONOLACTONE 25 MG PO TABS: 12.5000 mg | ORAL_TABLET | Freq: Every day | ORAL | 3 refills | Status: DC

## 2019-10-20 MED FILL — ?SPIRONOLACTONE 25 MG TABLE: 25 | 30 days supply | Qty: 15 | Fill #0

## 2019-10-20 MED FILL — ?FUROSEMIDE 20MG TABLET: 20 | 30 days supply | Qty: 30 | Fill #0

## 2019-10-20 MED FILL — ?CARVEDILOL 12.5 MG TABLET: 12.5 | 30 days supply | Qty: 60 | Fill #0

## 2019-10-20 NOTE — Telephone Encounter (Addendum)
[facility] Patient Advocate Encounter  [employer] called to confirm that she is to take [place] for a total of 8 weeks.  Alexandria Buck. Nadara Mustard [facility] Patient [facility] Phone: [phone] Fax:  [phone]

## 2019-10-20 NOTE — Progress Notes (Signed)
Patient ID: Alexandria Buck                 DOB: [birth year]                      MRN: [id number]     HPI: Alexandria Buck is a 59 y.o. female referred by Dr. Gardiner Rhyme to pharmacy clinic for HF medication management. PMH is significant for hypertension, bipolar disorder, tobacco use, hepatitis C and CHF. Most recent LVEF 30-30% on TEE.  At last visit carvedilol was increased to 12.5mg  BID. Scr improved on last BMP. We decided to wait until this visit to see if patient was approved for medicaid. If she was not, then we would apply for [facility] patient assistance.  Patient presents to clinic today. She brings the [facility] letters she previously referred to. These are not letters to get her medications free at [facility] as she states before, they are patient assistance letters for her Harvoni. Whether or not she is actually taking Harvoni I could not determine. I did make the ID pharmacist, [name] aware.  Patient states they were denied for Medicaid. First states she been out of her medications for 2 days, then says she took her last dose last night. Does not seen to really know what she is taking. She did not bring her list of blood pressure readings. States [facility] only had one prescription. Did not pick it up.  Symptomatically, she feels the same, denies dizziness, lightheadedness. But does have fatigue sometimes when she does a lot in a day. Denies chest pain or palpitations. Feels SOB when picking up around the hose. Able to complete all ADLs, but sometimes has to rest between tasks. Activity level fairly active, plays with her dog. She checks her weight at home (normal range 98 - 104 lbs). No LEE, PND, or orthopnea. Appetite has been good, ever now and then she doesn't have one. She adheres to a low-salt diet, uses a salt subsitute. Sleeps on 2 pillows.   Current CHF meds: carvedilol 12.5mg  BID, losartan 25mg  daily, furosemide 20mg  daily, spironolactone 12.5mg  daily Previously tried: lisinopril  (changed to losartan) BP goal: <130/80  Family History: The patient's family history includes Diabetes in her mother; Lung cancer in her sister.  Social History: + tobacco (1 every 2 weeks), ETOH socially (only 1 drink), no illicit drugs  Diet: does use canned fruits No frozen dinners No deli meat  Exercise: playing with dog, walk dogs 5-10 min twice a day  Home BP readings: 158/105 HR 97  Wt Readings from Last 3 Encounters:  10/06/19 98 lb 9.6 oz (44.7 kg)  09/23/19 100 lb 6.4 oz (45.5 kg)  09/10/19 103 lb 9.6 oz (47 kg)   BP Readings from Last 3 Encounters:  10/06/19 123/85  09/23/19 120/86  09/10/19 (!) 131/93   Pulse Readings from Last 3 Encounters:  10/06/19 91  09/23/19 82  09/10/19 82    Renal function: Estimated Creatinine Clearance: 50.3 mL/min (by C-G formula based on SCr of 0.84 mg/dL).  Past Medical History:  Diagnosis Date  . Acute systolic CHF (congestive heart failure) ([place])   . Alcohol abuse   . Bipolar disorder ([place])   . Depression   . Hepatitis C   . Hypertension    hx-not taking meds  . NICM (nonischemic cardiomyopathy) ([place])   . Pneumonia    had 5/12    Current Outpatient Medications on File Prior to Visit  Medication Sig Dispense Refill  .  carvedilol (COREG) 12.5 MG tablet Take 1 tablet (12.5 mg total) by mouth 2 (two) times daily. 60 tablet 11  . famotidine (PEPCID) 20 MG tablet Take 1 tablet (20 mg total) by mouth 2 (two) times daily. 60 tablet 3  . furosemide (LASIX) 40 MG tablet Take 0.5 tablets (20 mg total) by mouth daily. 30 tablet 6  . Ledipasvir-Sofosbuvir (HARVONI) 90-400 MG TABS Take 1 tablet by mouth daily. 28 tablet 1  . lithium carbonate 150 MG capsule Take 150 mg by mouth 2 (two) times daily with a meal. (Patient not taking: Reported on 10/06/2019)    . losartan (COZAAR) 25 MG tablet Take 1 tablet (25 mg total) by mouth daily. Start this 06/28/19 30 tablet 6  . nitroGLYCERIN (NITROSTAT) 0.4 MG SL tablet Place 1 tablet (0.4 mg  total) under the tongue every 5 (five) minutes as needed for chest pain. 25 tablet 3  . PARoxetine (PAXIL) 10 MG tablet Take 10 mg by mouth at bedtime. (Patient not taking: Reported on 10/06/2019)    . polyethylene glycol powder (GLYCOLAX/MIRALAX) 17 GM/SCOOP powder Take 17 g by mouth daily as needed for mild constipation. 3350 g 1  . risperiDONE (RISPERDAL) 1 MG tablet Take 1 mg by mouth at bedtime. (Patient not taking: Reported on 10/06/2019)    . spironolactone (ALDACTONE) 25 MG tablet Take 0.5 tablets (12.5 mg total) by mouth daily. 30 tablet 6   No current facility-administered medications on file prior to visit.    No Known Allergies   Assessment/Plan:  1. CHF - Blood pressure at goal in clinic today <130/80. Unsure of compliance and what medications she is and is not taking. I have sent refills of all her cardiac medications to [facility]. She also filled out [employer] patient assistance paperwork in the office. I will fax. She was given 14 days worth of Entresto 24/26mg . She was educated multiple times to STOP losartan and START entresto twice a day. Directions written on AVS and sample bottle. Continue carvedilol 12.5mg  BID and spironolactone 12.5mg  daily. I have asked patient to bring her blood pressure log and ALL her medications to her next appointment. Follow up in 2 weeks.  Ramond Dial, Pharm.D, BCPS, CPP [facility]  [address], [place], [place] [address]  Phone: [phone]; Fax: [phone]

## 2019-10-20 NOTE — Telephone Encounter (Signed)
Patient's first month of Harvoni was delivered to clinic on 6/7 and never picked up. Patient told [name] several times that she would be by to pick up and never did. This has been an ongoing issue with this patient. Will alert Marya Amsler. The best thing to do would probably be to not treat her at all until she is fully ready to take the pill every day with no missed doses, no issues, and no randomly stopping.

## 2019-10-20 NOTE — Telephone Encounter (Signed)
I agree with the assessment of not treating at this time as Alexandria Buck has proven that is at very high risk for treatment failure due to her less than optimal adherence and understanding.

## 2019-10-20 NOTE — Patient Instructions (Addendum)
STOP taking losartan START taking Entresto 24/26mg  twice a day CONTINUE taking carvedilol 12.5mg  twice a day (note new prescription is for 12.5mg  take 1 tablet twice a day) Continue taking furosemide 20mg  daily (note new prescription is for the 20mg - take 1 tablet daily) Continue spironolactone 12.5mg  daily  Call me at [phone] with any questions  Please bring ALL your medications and your blood pressure log to next visit.

## 2019-10-20 NOTE — Telephone Encounter (Signed)
SECURE  Message sent to [facility]/[employer] [place] inquiring about the status of the referral.

## 2019-10-20 NOTE — Progress Notes (Signed)
Virtual Visit via Telephone Note Due to current restrictions/limitations of in-office visits due to the COVID-19 pandemic, this scheduled clinical appointment was converted to a telehealth visit  I connected with Alexandria Buck on 10/20/19 at  9:30 AM EDT by telephone and verified that I am speaking with the correct person using two identifiers. I am in my office.  The patient is at home.  Only the patient and myself participated in this encounter.  I discussed the limitations, risks, security and privacy concerns of performing an evaluation and management service by telephone and the availability of in person appointments. I also discussed with the patient that there may be a patient responsible charge related to this service. The patient expressed understanding and agreed to proceed.  History of Present Illness: Patient with history ofbipolar disorder, HTN, tob dep, hep C,vestibular migraine,systolic CHF/NICMwith EF of 25-30%, MR moderate.  Last seen about 5 weeks ago.   CHF/NICM: She has seen cardiologist in follow-up since last visit.  Cardiac MRI is planned for further work-up of nonischemic cardiomyopathy.  They wanted to put her on Entresto but there is financial issues with the patient being able to afford the medication.  Carvedilol was increased.  She has a follow-up appointment with them today.  Wgh Loss/Dysphagia: Called by [facility] but pt declined appt due to lack of insurance.  She also no showed for appt for barium swallow.  Patient states she was not aware of the appointment.  She is still having dysphagia.  She has loss a little bit more weight since her last visit. Our caseworker referred her to legal aid to assist her on reapplying for Medicaid but she tells me she has not heard from them either.    Outpatient Encounter Medications as of 10/20/2019  Medication Sig Note  . carvedilol (COREG) 12.5 MG tablet Take 1 tablet (12.5 mg total) by mouth 2 (two) times daily.   .  famotidine (PEPCID) 20 MG tablet Take 1 tablet (20 mg total) by mouth 2 (two) times daily.   . furosemide (LASIX) 40 MG tablet Take 0.5 tablets (20 mg total) by mouth daily.   . Ledipasvir-Sofosbuvir (HARVONI) 90-400 MG TABS Take 1 tablet by mouth daily.   Marland Kitchen lithium carbonate 150 MG capsule Take 150 mg by mouth 2 (two) times daily with a meal. (Patient not taking: Reported on 10/06/2019)   . losartan (COZAAR) 25 MG tablet Take 1 tablet (25 mg total) by mouth daily. Start this 06/28/19   . nitroGLYCERIN (NITROSTAT) 0.4 MG SL tablet Place 1 tablet (0.4 mg total) under the tongue every 5 (five) minutes as needed for chest pain. 07/06/2019: Patient has on hand for when needed  . PARoxetine (PAXIL) 10 MG tablet Take 10 mg by mouth at bedtime. (Patient not taking: Reported on 10/06/2019)   . polyethylene glycol powder (GLYCOLAX/MIRALAX) 17 GM/SCOOP powder Take 17 g by mouth daily as needed for mild constipation.   . risperiDONE (RISPERDAL) 1 MG tablet Take 1 mg by mouth at bedtime. (Patient not taking: Reported on 10/06/2019)   . spironolactone (ALDACTONE) 25 MG tablet Take 0.5 tablets (12.5 mg total) by mouth daily.    No facility-administered encounter medications on file as of 10/20/2019.    Observations/Objective: No direct observation done as this was a telephone encounter  Assessment and Plan:  1. Oropharyngeal dysphagia I will have my CMA reschedule the barium swallow. Message sent to caseworker to see whether legal aid can touch base with her to help get her  approved for Medicaid so that she can see the gastroenterologist.  2. Weight loss See #1 above  3. Chronic combined systolic and diastolic CHF, NYHA class 2 ([place]) Followed by cardiology.  She plans to keep her follow-up appointment with them today.  Follow Up Instructions: 2 mth  I discussed the assessment and treatment plan with the patient. The patient was provided an opportunity to ask questions and all were answered. The patient  agreed with the plan and demonstrated an understanding of the instructions.   The patient was advised to call back or seek an in-person evaluation if the symptoms worsen or if the condition fails to improve as anticipated.  I provided 8 minutes of non-face-to-face time during this encounter.   Karle Plumber, MD

## 2019-10-22 ENCOUNTER — Telehealth: Payer: Self-pay

## 2019-10-22 ENCOUNTER — Telehealth: Payer: Self-pay | Admitting: Pharmacist

## 2019-10-22 NOTE — Telephone Encounter (Signed)
Message received from [place] [facility]/[employer] [place] - she noted that [place] spoke to the patient on 09/16/2019. The patient informed her that she had already applied for medicaid and was working to get spousal benefits through [place].  She also noted that the patient was not interested in the appeal process at that time.    This CM spoke to with patient today and explained above noted conversation. The patient said that she was denied medicaid and was not interested in the appeal process, she just wanted to " leave it alone."  She was interested in [employer] assistance for her medical bills.  Explained to her that she can apply for CFA/Orange Card and Blue Card for possible assistance with the bills/referrals and medication costs.  After she completes the application and has collected the necessary documents, she can call [facility] to schedule an appointment with the financial counselor. The CFA application was mailed to the patient at her request

## 2019-10-22 NOTE — Telephone Encounter (Signed)
Received notification that Entresto patient assistance was approved through 10/21/20. Pt received 2 weeks worth of Entresto 24-26mg  BID samples at appt on 7/13, follow up appt scheduled 7/26. Called pt and made her aware that [employer] will be contacting her to deliver [place], she verbalized understanding.

## 2019-11-02 ENCOUNTER — Other Ambulatory Visit: Payer: Self-pay

## 2019-11-02 ENCOUNTER — Ambulatory Visit (INDEPENDENT_AMBULATORY_CARE_PROVIDER_SITE_OTHER): Payer: Self-pay | Admitting: Pharmacist

## 2019-11-02 VITALS — BP 100/68 | HR 74 | Wt 99.2 lb

## 2019-11-02 DIAGNOSIS — I5022 Chronic systolic (congestive) heart failure: Secondary | ICD-10-CM

## 2019-11-02 NOTE — Progress Notes (Signed)
Patient ID: Alexandria Buck                 DOB: Aug 18, 1960                      MRN: [id number]     HPI: Alexandria Buck is a 59 y.o. female referred by Dr. Gardiner Rhyme to pharmacy clinic for HF medication management. PMH is significant for hypertension, bipolar disorder, tobacco use, hepatitis C and CHF. Most recent LVEF 30-30% on TEE.  Recently patient was approved for [employer] patient assistance to received Entresto for free. Compliance is questionable for patient. She was denied medicaid and does not wish to appeal. I asked her to bring her blood pressure log and her medication bottles to this appointment.  Patient presents to clinic today. She states she does not feel well. Has been feeling tired and dizzy. She keeps trying to talk about her GI procedure tomorrow. She calls her sister Alexandria Buck on speaker phone during part of the visit. States she wants her to be her emergency contact. Asks me to explain what's going on with her heart. States she is checking her blood pressure at home. But when asked what the numbers are she said its been high. But cannot recall the numbers. When her blood pressure as 100/68 in clinic she said it was high- I corrected her that her blood pressure was on the lower side.   She did bring in all her heart medications. This including metoprolol (which she should not be on), losartan (was also stopped), 2 different bottles of spironolactone, 3 different strengths of carvedilol, 2 different strengths of furosemide and Entresto. She states she takes one of her medications twice a day (there are 2 she should be taking twice a day). When asked what medications she was taking, first she says all of them and then she doesn't given a clear answer.  Current CHF meds: carvedilol 12.5mg  BID,Enresto 24/26mg  twice a day, furosemide 20mg  daily, spironolactone 12.5mg  daily Previously tried: lisinopril (changed to losartan) BP goal: <130/80  Family History: The patient's family history  includes Diabetes in her mother; Lung cancer in her sister.  Social History: + tobacco (1 every 2 weeks), ETOH socially (only 1 drink), no illicit drugs  Diet: does use canned fruits No frozen dinners No deli meat  Exercise: playing with dog, walk dogs 5-10 min twice a day  Home BP readings: didn't bring the log Wt Readings from Last 3 Encounters:  10/20/19 99 lb (44.9 kg)  10/06/19 98 lb 9.6 oz (44.7 kg)  09/23/19 100 lb 6.4 oz (45.5 kg)   BP Readings from Last 3 Encounters:  10/20/19 122/78  10/06/19 123/85  09/23/19 120/86   Pulse Readings from Last 3 Encounters:  10/20/19 73  10/06/19 91  09/23/19 82    Renal function: CrCl cannot be calculated (Patient's most recent lab result is older than the maximum 21 days allowed.).  Past Medical History:  Diagnosis Date   Acute systolic CHF (congestive heart failure) ([facility])    Alcohol abuse    Bipolar disorder ([facility])    Depression    Hepatitis C    Hypertension    hx-not taking meds   NICM (nonischemic cardiomyopathy) ([place])    Pneumonia    had 5/12    Current Outpatient Medications on File Prior to Visit  Medication Sig Dispense Refill   carvedilol (COREG) 12.5 MG tablet Take 1 tablet (12.5 mg total) by mouth 2 (two) times daily. [place]  tablet 3   famotidine (PEPCID) 20 MG tablet Take 1 tablet (20 mg total) by mouth 2 (two) times daily. 60 tablet 3   furosemide (LASIX) 20 MG tablet Take 1 tablet (20 mg total) by mouth daily. 90 tablet 3   Ledipasvir-Sofosbuvir (HARVONI) 90-400 MG TABS Take 1 tablet by mouth daily. 28 tablet 1   lithium carbonate 150 MG capsule Take 150 mg by mouth 2 (two) times daily with a meal. (Patient not taking: Reported on 10/06/2019)     nitroGLYCERIN (NITROSTAT) 0.4 MG SL tablet Place 1 tablet (0.4 mg total) under the tongue every 5 (five) minutes as needed for chest pain. 25 tablet 3   PARoxetine (PAXIL) 10 MG tablet Take 10 mg by mouth at bedtime. (Patient not taking: Reported on  10/06/2019)     polyethylene glycol powder (GLYCOLAX/MIRALAX) 17 GM/SCOOP powder Take 17 g by mouth daily as needed for mild constipation. 3350 g 1   risperiDONE (RISPERDAL) 1 MG tablet Take 1 mg by mouth at bedtime. (Patient not taking: Reported on 10/06/2019)     sacubitril-valsartan (ENTRESTO) 24-26 MG Take 1 tablet by mouth 2 (two) times daily. 60 tablet 11   spironolactone (ALDACTONE) 25 MG tablet Take 0.5 tablets (12.5 mg total) by mouth daily. 45 tablet 3   No current facility-administered medications on file prior to visit.    No Known Allergies   Assessment/Plan:  1. CHF - Blood pressure at goal in clinic today <130/80. Still unsure of compliance and what medications she is and is not taking. I'm concerned that she was taking duplicates. We moved all the medications she should not be taking (including the strengths of carvedilol and furosemide she is not longer on to avoid confusion to a bag labeled by patient "don't take no more". Then moved all the medications she should be taking (carvedilol 12.5mg  BID,Enresto 24/26mg  twice a day, furosemide 20mg  daily, spironolactone 12.5mg  daily) into a bag labeled by patient "take everyday." I reminded her multiple times not to take any from the bag "don't take no more." I will look into if I can get [facility] to do a medbox for her. Only issue would be Entresto which does not come from them. I am concerned that the reason she is feeling dizzy and weak is because she is over medicating. Hopefully now she will be on all the correct medications. 2 weeks ago I gave her samples of Entresto that would last 2 weeks. Patient had about a weeks worth left still. Will check BMP in a week.    Ramond Dial, Pharm.D, BCPS, CPP [facility]  [address], [place], [place] [address]  Phone: [phone]; Fax: [phone]

## 2019-11-02 NOTE — Patient Instructions (Addendum)
Please only take the medications we placed in the bag labeled Take everyday Do NOT take the medications in the bags labeled Don't take anymore  Please bring your blood pressure readings with you to your next appointment  Call us at [phone] with any questions or concerns We will see if your pharmacy can do a medbox for you

## 2019-11-03 ENCOUNTER — Ambulatory Visit (HOSPITAL_COMMUNITY)
Admission: RE | Admit: 2019-11-03 | Discharge: 2019-11-03 | Disposition: A | Discharge status: Home / Self Care | Payer: Self-pay | Source: Ambulatory Visit | Attending: Internal Medicine | Admitting: Internal Medicine

## 2019-11-03 DIAGNOSIS — R1312 Dysphagia, oropharyngeal phase: Secondary | ICD-10-CM | POA: Insufficient documentation

## 2019-11-04 ENCOUNTER — Telehealth: Payer: Self-pay

## 2019-11-04 ENCOUNTER — Telehealth: Payer: Self-pay | Admitting: Internal Medicine

## 2019-11-04 DIAGNOSIS — R634 Abnormal weight loss: Secondary | ICD-10-CM

## 2019-11-04 DIAGNOSIS — T17908A Unspecified foreign body in respiratory tract, part unspecified causing other injury, initial encounter: Secondary | ICD-10-CM

## 2019-11-04 DIAGNOSIS — R1312 Dysphagia, oropharyngeal phase: Secondary | ICD-10-CM

## 2019-11-04 NOTE — Telephone Encounter (Signed)
Call placed to patient regarding her CFA/OC applications.  Wynonia Hazard, [facility] Financial Counsleor, said he would be able to meet with the patient and assist her with completing the applications and an appointment to meet with him can be scheduled for next week.  This information was shared with the patient.  She said that it could not be next week because she is having surgery next week. She said that the doctor called her and explained about the surgery and she is waiting for a call back with more information.  When asked about payment for the procedure, she said she is trying to figure that out.  This CM explained that she could benefit from any financial assistance that CFA would provide.  She asked that this CM call her back next week as she did not want to schedule anything at this time.  She also noted that she is not sure that she wants to answer all of the questions on the CFA/OC applications as they are " too personal."

## 2019-11-04 NOTE — Telephone Encounter (Signed)
Phone call placed to patient today to discuss the results of the barium swallow.  From review of the radiologist note, patient had a very hard time swallowing and also had a little bit of aspiration.  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  Advised patient that it is very important that we get her to a gastroenterologist to have this addressed.  Informed her that they will most likely do an endoscopy to stretch that area and biopsy.  Patient is uninsured.  My case worker has been working with her.  She was denied Medicaid.  She refused trying to appeal with the legal aid.  She was mailed forms for the orange card/cone discount.  She has them and states that she will fill them out but "there are too many questions and they asking for too many things."  Advised patient that I will also get her to a speech therapist to help teach her how to swallow safely.  Patient expressed understanding but health literacy is very low. Also advise to limit diet for not to full liquids and soft consistency things like grits.  Try to avoid meats and other solids until she sees GI.

## 2019-11-06 ENCOUNTER — Telehealth: Payer: Self-pay | Admitting: Pharmacist

## 2019-11-06 DIAGNOSIS — I5022 Chronic systolic (congestive) heart failure: Secondary | ICD-10-CM

## 2019-11-06 NOTE — Telephone Encounter (Signed)
Called pt and scheduled her for labs (BMP) on Monday 8/2

## 2019-11-09 ENCOUNTER — Other Ambulatory Visit: Payer: Self-pay

## 2019-11-10 ENCOUNTER — Encounter: Payer: Self-pay | Admitting: Gastroenterology

## 2019-11-11 ENCOUNTER — Other Ambulatory Visit: Payer: Self-pay

## 2019-11-12 ENCOUNTER — Other Ambulatory Visit: Payer: Self-pay

## 2019-11-15 ENCOUNTER — Telehealth: Payer: Self-pay | Admitting: Internal Medicine

## 2019-11-15 NOTE — Telephone Encounter (Signed)
-----   Message from Ena Dawley sent at 11/09/2019  4:05 PM EDT ----- Regarding: RE: urgent GI referral Good Afternoon   On  7/29  [facility]  contact patient  and left a message to call back to schedule . ----- Message ----- From: Ladell Pier, MD Sent: 11/04/2019  10:08 AM EDT To: Ena Dawley Subject: urgent GI referral                             Please try to get her to the gastroenterologist as soon as possible.  She has been having difficulty swallowing.  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  Patient does not have insurance and is delayed in getting in form for orange card/cone discount.  I have explained to her that she needs to see the gastroenterologist as soon as possible and may have to make payment arrangements with them.

## 2019-11-18 NOTE — Telephone Encounter (Signed)
Contacted pt to go over Dr. Wynetta Emery message was unable to lvm due to vm being full

## 2019-11-19 NOTE — Telephone Encounter (Signed)
Phone call to pt.  Advised of Dr. Durenda Age message re: contacting [facility].  Pt. Stated she has an appt. with GI on 01/08/20.

## 2019-11-23 ENCOUNTER — Ambulatory Visit: Payer: Self-pay

## 2019-11-23 NOTE — Progress Notes (Unsigned)
Patient ID: Alexandria Buck                 DOB: [birth year]                      MRN: [id number]     HPI: Alexandria Buck is a 59 y.o. female referred by Dr. Gardiner Rhyme to pharmacy clinic for HF medication management. PMH is significant for hypertension, bipolar disorder, tobacco use, hepatitis C and CHF. Most recent LVEF 30-30% on TEE.  Recently patient was approved for [employer] patient assistance to received Entresto for free. She was denied medicaid and does not wish to appeal. Compliance is questionable for patient.  At most recent visit to pharmacy clinic on 11/02/19, her BP was controlled at 100/68 mmHg with HR 74 and she stated that she did not feel well and was feeling tired and dizzy. She did bring in all her heart medications. This including metoprolol (which she should not be on), losartan (was also stopped), 2 different bottles of spironolactone, 3 different strengths of carvedilol, 2 different strengths of furosemide and Entresto. She states she takes one of her medications twice a day (there are 2 she should be taking twice a day). When asked what medications she was taking, first she says all of them and then she doesn't given a clear answer. All medications that she should not be taking were removed and placed in a separate labeled bag.  - [facility] for medbox? Delene Loll from [employer], got samples last time - Didn't come to get labs  Current CHF meds: carvedilol 12.5mg  BID, Enresto 24/26mg  BID, furosemide 20mg  daily, spironolactone 12.5mg  daily Previously tried: lisinopril (changed to losartan) BP goal: <130/80  Family History: The patient's family history includes Diabetes in her mother; Lung cancer in her sister.  Social History: + tobacco (1 every 2 weeks), ETOH socially (only 1 drink), no illicit drugs  Diet: does use canned fruits No frozen dinners No deli meat  Exercise: playing with dog, walk dogs 5-10 min twice a day  Home BP readings: didn't  bring the log  Wt Readings from Last 3 Encounters:  11/02/19 99 lb 3.2 oz (45 kg)  10/20/19 99 lb (44.9 kg)  10/06/19 98 lb 9.6 oz (44.7 kg)   BP Readings from Last 3 Encounters:  11/02/19 100/68  10/20/19 122/78  10/06/19 123/85   Pulse Readings from Last 3 Encounters:  11/02/19 74  10/20/19 73  10/06/19 91    Renal function: CrCl cannot be calculated (Patient's most recent lab result is older than the maximum 21 days allowed.).  Past Medical History:  Diagnosis Date  . Acute systolic CHF (congestive heart failure) ([place])   . Alcohol abuse   . Bipolar disorder ([place])   . Depression   . Hepatitis C   . Hypertension    hx-not taking meds  . NICM (nonischemic cardiomyopathy) ([place])   . Pneumonia    had 5/12    Current Outpatient Medications on File Prior to Visit  Medication Sig Dispense Refill  . carvedilol (COREG) 12.5 MG tablet Take 1 tablet (12.5 mg total) by mouth 2 (two) times daily. 180 tablet 3  . famotidine (PEPCID) 20 MG tablet Take 1 tablet (20 mg total) by mouth 2 (two) times daily. 60 tablet 3  . furosemide (LASIX) 20 MG tablet Take 1 tablet (20 mg total) by mouth daily. 90 tablet 3  . Ledipasvir-Sofosbuvir (HARVONI) 90-400 MG TABS Take 1 tablet by mouth daily. (  Patient not taking: Reported on 11/02/2019) 28 tablet 1  . lithium carbonate 150 MG capsule Take 150 mg by mouth 2 (two) times daily with a meal. (Patient not taking: Reported on 10/06/2019)    . nitroGLYCERIN (NITROSTAT) 0.4 MG SL tablet Place 1 tablet (0.4 mg total) under the tongue every 5 (five) minutes as needed for chest pain. 25 tablet 3  . PARoxetine (PAXIL) 10 MG tablet Take 10 mg by mouth at bedtime. (Patient not taking: Reported on 10/06/2019)    . polyethylene glycol powder (GLYCOLAX/MIRALAX) 17 GM/SCOOP powder Take 17 g by mouth daily as needed for mild constipation. 3350 g 1  . risperiDONE (RISPERDAL) 1 MG tablet Take 1 mg by mouth at bedtime. (Patient not taking: Reported on 10/06/2019)    .  sacubitril-valsartan (ENTRESTO) 24-26 MG Take 1 tablet by mouth 2 (two) times daily. 60 tablet 11  . spironolactone (ALDACTONE) 25 MG tablet Take 0.5 tablets (12.5 mg total) by mouth daily. 45 tablet 3   No current facility-administered medications on file prior to visit.    No Known Allergies   Assessment/Plan:  1. CHF - Blood pressure at goal in clinic today <130/80. Still unsure of compliance and what medications she is and is not taking. I'm concerned that she was taking duplicates. We moved all the medications she should not be taking (including the strengths of carvedilol and furosemide she is not longer on to avoid confusion to a bag labeled by patient "don't take no more". Then moved all the medications she should be taking (carvedilol 12.5mg  BID,Enresto 24/26mg  twice a day, furosemide 20mg  daily, spironolactone 12.5mg  daily) into a bag labeled by patient "take everyday." I reminded her multiple times not to take any from the bag "don't take no more." I will look into if I can get community health [facility] wellness to do a medbox for her. Only issue would be Entresto which does not come from them. I am concerned that the reason she is feeling dizzy and weak is because she is over medicating. Hopefully now she will be on all the correct medications. 2 weeks ago I gave her samples of Entresto that would last 2 weeks. Patient had about a weeks worth left still. Will check BMP in a week.    Ramond Dial, Pharm.D, BCPS, CPP [place]  [address] [place], [place] [address]  Phone: [phone]; Fax: [phone]

## 2019-11-24 ENCOUNTER — Other Ambulatory Visit: Payer: Self-pay

## 2019-11-24 ENCOUNTER — Ambulatory Visit (INDEPENDENT_AMBULATORY_CARE_PROVIDER_SITE_OTHER): Payer: Self-pay | Admitting: Pharmacist

## 2019-11-24 VITALS — BP 110/86 | HR 57 | Wt 97.4 lb

## 2019-11-24 DIAGNOSIS — I5022 Chronic systolic (congestive) heart failure: Secondary | ICD-10-CM

## 2019-11-24 NOTE — Progress Notes (Signed)
Patient ID: Alexandria Buck                 DOB: 02/16/1961                      MRN: [id number]     HPI: Alexandria Buck is a 59 y.o. female referred by Alexandria Buck to HTN clinic. PMH is significant for hypertension, bipolar disorder, tobacco use, hepatitis C and CHF. Most recent LVEF 30-30% on TEE.  This is patient's fourth visit for CHF medication titration.  However, patient often unsure which medications she should be on and which were discontinued.  At last visit, medications were organized into two bags, one she should take and ones she should not.  Has lab work scheduled but patient has not had drawn yet.  Patient presents today in good spirits and had lab work drawn.  Reports she knows she takes four pills in the morning and two at night and reports compliance/  Reports she quit smoking about a month ago.  Is having difficulty swallowing, has GI appointment on 10/1  Current HTN meds: carvedilol 12.5mg  BID,Enresto 24/26mg  twice a day, furosemide 20mg  daily, spironolactone 12.5mg  daily  BP goal: <130/80  Social History:  Reports has stopped smoking  Home BP readings: Reports is testing BP but can not recall readings  Wt Readings from Last 3 Encounters:  11/02/19 99 lb 3.2 oz (45 kg)  10/20/19 99 lb (44.9 kg)  10/06/19 98 lb 9.6 oz (44.7 kg)   BP Readings from Last 3 Encounters:  11/02/19 100/68  10/20/19 122/78  10/06/19 123/85   Pulse Readings from Last 3 Encounters:  11/02/19 74  10/20/19 73  10/06/19 91    Renal function: CrCl cannot be calculated (Patient's most recent lab result is older than the maximum 21 days allowed.).  Past Medical History:  Diagnosis Date  . Acute systolic CHF (congestive heart failure) ([place])   . Alcohol abuse   . Bipolar disorder ([place])   . Depression   . Hepatitis C   . Hypertension    hx-not taking meds  . NICM (nonischemic cardiomyopathy) ([place])   . Pneumonia    had 5/12    Current Outpatient Medications on File Prior to Visit   Medication Sig Dispense Refill  . carvedilol (COREG) 12.5 MG tablet Take 1 tablet (12.5 mg total) by mouth 2 (two) times daily. 180 tablet 3  . famotidine (PEPCID) 20 MG tablet Take 1 tablet (20 mg total) by mouth 2 (two) times daily. 60 tablet 3  . furosemide (LASIX) 20 MG tablet Take 1 tablet (20 mg total) by mouth daily. 90 tablet 3  . Ledipasvir-Sofosbuvir (HARVONI) 90-400 MG TABS Take 1 tablet by mouth daily. (Patient not taking: Reported on 11/02/2019) 28 tablet 1  . lithium carbonate 150 MG capsule Take 150 mg by mouth 2 (two) times daily with a meal. (Patient not taking: Reported on 10/06/2019)    . nitroGLYCERIN (NITROSTAT) 0.4 MG SL tablet Place 1 tablet (0.4 mg total) under the tongue every 5 (five) minutes as needed for chest pain. 25 tablet 3  . PARoxetine (PAXIL) 10 MG tablet Take 10 mg by mouth at bedtime. (Patient not taking: Reported on 10/06/2019)    . polyethylene glycol powder (GLYCOLAX/MIRALAX) 17 GM/SCOOP powder Take 17 g by mouth daily as needed for mild constipation. 3350 g 1  . risperiDONE (RISPERDAL) 1 MG tablet Take 1 mg by mouth at bedtime. (Patient not taking: Reported on 10/06/2019)    .  sacubitril-valsartan (ENTRESTO) 24-26 MG Take 1 tablet by mouth 2 (two) times daily. 60 tablet 11  . spironolactone (ALDACTONE) 25 MG tablet Take 0.5 tablets (12.5 mg total) by mouth daily. 45 tablet 3   No current facility-administered medications on file prior to visit.    No Known Allergies   Assessment/Plan:  1. CHF - Patient BP today 110/86 which is slightly above goal of <130/80.  Congratulated patient on quitting smoking and on improved medication compliance.  Advised patient we would call tomorrow with BMP results and if results WNL, consider Entresto titration.  Patient voiced understanding.  Karren Cobble, PharmD, BCACP, [place] [address] [place], [place] [address] Phone: [phone]; Fax: [phone] 11/24/2019 4:05 PM

## 2019-11-24 NOTE — Patient Instructions (Addendum)
It was great meeting you today!  We would like to keep your blood pressure less than 130/80  Continue your  Carvedilol 12.5 mg twice a day Entresto 24-26mg  twice a day Furosemide 20 mg once a day Spironolactone 12.5 mg once a day  Congratulations on quitting smoking!  We will call you tomorrow with your blood work results and decide if we need to increase your medicine.  Call us with any questions!  Karren Cobble, PharmD, BCACP, [place] [address], [place], [place] [address] Phone: [phone]; Fax: [phone] 11/24/2019 3:56 PM

## 2019-11-25 LAB — BASIC METABOLIC PANEL
BUN/Creatinine Ratio: 8 — ABNORMAL LOW (ref 12–28)
BUN: 6 mg/dL — ABNORMAL LOW (ref 8–27)
CO2: 17 mmol/L — ABNORMAL LOW (ref 20–29)
Calcium: 9.2 mg/dL (ref 8.7–10.3)
Chloride: 96 mmol/L (ref 96–106)
Creatinine, Ser: 0.71 mg/dL (ref 0.57–1.00)
GFR calc Af Amer: 107 mL/min/{1.73_m2} (ref >59)
GFR calc non Af Amer: 93 mL/min/{1.73_m2} (ref >59)
Glucose: 76 mg/dL (ref 65–99)
Potassium: 4.2 mmol/L (ref 3.5–5.2)
Sodium: 131 mmol/L — ABNORMAL LOW (ref 134–144)
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
## 2019-12-07 ENCOUNTER — Ambulatory Visit: Payer: Self-pay | Attending: Critical Care Medicine

## 2019-12-07 DIAGNOSIS — Z23 Encounter for immunization: Secondary | ICD-10-CM

## 2019-12-07 NOTE — Progress Notes (Signed)
   Covid-19 Vaccination Clinic  Name:  Alexandria Buck    MRN: [id number] DOB: 30-May-1960  12/07/2019  [name] was observed post Covid-19 immunization for 15 minutes  without incident. She was provided with Vaccine Information Sheet and instruction to access the V-Safe system.   [name] was instructed to call 911 with any severe reactions post vaccine: Marland Kitchen Difficulty breathing  . Swelling of face and throat  . A fast heartbeat  . A bad rash all over body  . Dizziness and weakness   Immunizations Administered    Name Date Dose VIS Date Route   Moderna COVID-19 Vaccine 12/07/2019  1:23 PM 0.5 mL 03/2019 Intramuscular   Manufacturer: Moderna   Lot: [id number]   [place]: 05056-788-93

## 2019-12-08 ENCOUNTER — Telehealth: Payer: Self-pay

## 2019-12-08 ENCOUNTER — Ambulatory Visit (INDEPENDENT_AMBULATORY_CARE_PROVIDER_SITE_OTHER): Payer: Self-pay | Admitting: Cardiology

## 2019-12-08 ENCOUNTER — Encounter: Payer: Self-pay | Admitting: Cardiology

## 2019-12-08 ENCOUNTER — Other Ambulatory Visit: Payer: Self-pay

## 2019-12-08 VITALS — BP 140/82 | HR 66 | Ht 61.0 in | Wt 98.0 lb

## 2019-12-08 DIAGNOSIS — R002 Palpitations: Secondary | ICD-10-CM

## 2019-12-08 DIAGNOSIS — Z0000 Encounter for general adult medical examination without abnormal findings: Secondary | ICD-10-CM

## 2019-12-08 DIAGNOSIS — Z720 Tobacco use: Secondary | ICD-10-CM

## 2019-12-08 DIAGNOSIS — I340 Nonrheumatic mitral (valve) insufficiency: Secondary | ICD-10-CM

## 2019-12-08 DIAGNOSIS — I5042 Chronic combined systolic (congestive) and diastolic (congestive) heart failure: Secondary | ICD-10-CM

## 2019-12-08 DIAGNOSIS — I10 Essential (primary) hypertension: Secondary | ICD-10-CM

## 2019-12-08 NOTE — Telephone Encounter (Signed)
Called patient to follow up on inquiry regarding Medicaid application submitted by myself on her behalf to [place] in June 2021. Patient had been assigned a caseworker, Sharlet Salina [phone]) who was suppose to reach out to the patient to complete the application. Patient states they have not heard from them yet.   Called [employer] today on behalf of patient and left message for a return call. Will try again and call patient back on 12/09/19.

## 2019-12-08 NOTE — Patient Instructions (Signed)
Medication Instructions:  Your physician recommends that you continue on your current medications as directed. Please refer to the Current Medication list given to you today.  *If you need a refill on your cardiac medications before your next appointment, please call your pharmacy*   Lab Work: BMET, [place] today  If you have labs (blood work) drawn today and your tests are completely normal, you will receive your results only by: Marland Kitchen MyChart Message (if you have MyChart) OR . A paper copy in the mail If you have any lab test that is abnormal or we need to change your treatment, we will call you to review the results.   Testing/Procedures: Your physician has requested that you have an echocardiogram. Echocardiography is a painless test that uses sound waves to create images of your heart. It provides your doctor with information about the size and shape of your heart and how well your heart's chambers and valves are working. This procedure takes approximately one hour. There are no restrictions for this procedure.  This will be done at our [facility] location:  [place]: At [facility], you and your health needs are our priority.  As part of our continuing mission to provide you with exceptional heart care, we have created designated Provider Care Teams.  These Care Teams include your primary Cardiologist (physician) and Advanced Practice Providers (APPs -  Physician Assistants and Nurse Practitioners) who all work together to provide you with the care you need, when you need it.  We recommend signing up for the patient portal called "MyChart".  Sign up information is provided on this After Visit Summary.  MyChart is used to connect with patients for Virtual Visits (Telemedicine).  Patients are able to view lab/test results, encounter notes, upcoming appointments, etc.  Non-urgent messages can be sent to your provider as well.   To learn more about what you  can do with MyChart, go to [email or web address].    Your next appointment:   2 weeks with pharmacist-BRING ALL MEDICATIONS WITH YOU  3 months with Dr. Gardiner Rhyme

## 2019-12-08 NOTE — Telephone Encounter (Signed)
Patient is returning call.  °

## 2019-12-09 LAB — BASIC METABOLIC PANEL
BUN/Creatinine Ratio: 7 — ABNORMAL LOW (ref 12–28)
BUN: 6 mg/dL — ABNORMAL LOW (ref 8–27)
CO2: 22 mmol/L (ref 20–29)
Calcium: 9 mg/dL (ref 8.7–10.3)
Chloride: 104 mmol/L (ref 96–106)
Creatinine, Ser: 0.89 mg/dL (ref 0.57–1.00)
GFR calc Af Amer: 81 mL/min/{1.73_m2} (ref >59)
GFR calc non Af Amer: 71 mL/min/{1.73_m2} (ref >59)
Glucose: 77 mg/dL (ref 65–99)
Potassium: 4.9 mmol/L (ref 3.5–5.2)
Sodium: 137 mmol/L (ref 134–144)

## 2019-12-09 LAB — MAGNESIUM: Magnesium: 2.2 mg/dL (ref 1.6–2.3)

## 2019-12-10 ENCOUNTER — Telehealth: Payer: Self-pay

## 2019-12-10 DIAGNOSIS — Z0000 Encounter for general adult medical examination without abnormal findings: Secondary | ICD-10-CM

## 2019-12-10 NOTE — Telephone Encounter (Signed)
Called patient to follow up on Medicaid application. Patient has not received a call from her case manager Sharlet Salina. Will contact [employer] to determine if a call has been made to patient. Will return call to patient on 12/15/19. Patient is requesting address to location for echocardiogram. Patient was mailed a letter per her request. Will try to research this information for her as well to rely during return call.

## 2019-12-10 NOTE — Telephone Encounter (Signed)
I returned the patient's call and left a message.

## 2019-12-11 ENCOUNTER — Other Ambulatory Visit: Payer: Self-pay | Admitting: *Deleted

## 2019-12-11 DIAGNOSIS — I5042 Chronic combined systolic (congestive) and diastolic (congestive) heart failure: Secondary | ICD-10-CM

## 2019-12-11 MED ORDER — ENTRESTO 49-51 MG PO TABS: 1.0000 | ORAL_TABLET | Freq: Two times a day (BID) | ORAL | 3 refills | Status: DC

## 2019-12-11 MED FILL — ENTRESTO 49 MG-51 MG TABLET: 49-51 | 30 days supply | Qty: 60 | Fill #0

## 2019-12-21 ENCOUNTER — Other Ambulatory Visit: Payer: Self-pay

## 2019-12-21 ENCOUNTER — Encounter: Payer: Self-pay | Admitting: Internal Medicine

## 2019-12-21 ENCOUNTER — Ambulatory Visit (HOSPITAL_BASED_OUTPATIENT_CLINIC_OR_DEPARTMENT_OTHER): Payer: Self-pay | Admitting: Pharmacist

## 2019-12-21 ENCOUNTER — Ambulatory Visit: Payer: Self-pay | Attending: Internal Medicine | Admitting: Internal Medicine

## 2019-12-21 VITALS — BP 118/72 | HR 98 | Temp 98.2°F | Resp 16 | Wt 97.8 lb

## 2019-12-21 DIAGNOSIS — I5042 Chronic combined systolic (congestive) and diastolic (congestive) heart failure: Secondary | ICD-10-CM

## 2019-12-21 DIAGNOSIS — R634 Abnormal weight loss: Secondary | ICD-10-CM

## 2019-12-21 DIAGNOSIS — M25562 Pain in left knee: Secondary | ICD-10-CM

## 2019-12-21 DIAGNOSIS — R1312 Dysphagia, oropharyngeal phase: Secondary | ICD-10-CM | POA: Insufficient documentation

## 2019-12-21 DIAGNOSIS — Z23 Encounter for immunization: Secondary | ICD-10-CM

## 2019-12-21 DIAGNOSIS — Z1231 Encounter for screening mammogram for malignant neoplasm of breast: Secondary | ICD-10-CM

## 2019-12-21 DIAGNOSIS — Z87891 Personal history of nicotine dependence: Secondary | ICD-10-CM | POA: Insufficient documentation

## 2019-12-21 DIAGNOSIS — B182 Chronic viral hepatitis C: Secondary | ICD-10-CM

## 2019-12-21 DIAGNOSIS — I10 Essential (primary) hypertension: Secondary | ICD-10-CM

## 2019-12-21 MED ORDER — ACETAMINOPHEN-CODEINE 300-30 MG PO TABS: 1.0000 | ORAL_TABLET | Freq: Three times a day (TID) | ORAL | 0 refills | Status: DC | PRN

## 2019-12-21 MED FILL — ACETAMINOPHEN-COD #3 TABLET: 300-30 | 5 days supply | Qty: 15 | Fill #0

## 2019-12-21 MED FILL — ENTRESTO 49 MG-51 MG TABLET: 49-51 | 30 days supply | Qty: 60 | Fill #0

## 2019-12-21 NOTE — Patient Instructions (Signed)
Please call the BCCCP (breast and cervical cancer control program) at [phone] to schedule diagnostic mammogram

## 2019-12-21 NOTE — Progress Notes (Signed)
Pt states she is having left leg pain
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
## 2019-12-21 NOTE — Progress Notes (Signed)
Patient presents for vaccination against influenza per orders of Dr. Johnson. Consent given. Counseling provided. No contraindications exists. Vaccine administered without incident.  ° °Luke Van Ausdall, PharmD, CPP °Clinical Pharmacist °[facility] °[phone] ° °

## 2019-12-23 ENCOUNTER — Telehealth: Payer: Self-pay | Admitting: Internal Medicine

## 2019-12-23 NOTE — Telephone Encounter (Signed)
If pt calls back please provide information   Please call the BCCCP (breast and cervical cancer control program) at [phone] to schedule diagnostic mammogram

## 2019-12-23 NOTE — Telephone Encounter (Signed)
Returned Ms. Storts call was unable to lvm

## 2019-12-23 NOTE — Telephone Encounter (Signed)
Pt called back, understood information. Provided with contact/address.

## 2019-12-23 NOTE — Telephone Encounter (Signed)
Copied from [place] [id number]. Topic: General - Other >> Dec 22, 2019  2:41 PM Celene Kras wrote: Reason for CRM: Pt called stating that she was supposed to have a mammogram set up for her. Please advise. >> Dec 23, 2019  9:43 AM Illene Bolus E wrote: Please advise.  >> Dec 22, 2019  4:20 PM Keene Breath wrote: Patient is calling again regarding getting her appt. For a mammo.  Please advise and call patient to discuss

## 2019-12-24 ENCOUNTER — Other Ambulatory Visit: Payer: Self-pay

## 2019-12-24 ENCOUNTER — Telehealth: Payer: Self-pay

## 2019-12-24 ENCOUNTER — Ambulatory Visit (HOSPITAL_COMMUNITY): Payer: Self-pay | Attending: Cardiology

## 2019-12-24 DIAGNOSIS — Z789 Other specified health status: Secondary | ICD-10-CM

## 2019-12-24 DIAGNOSIS — I5042 Chronic combined systolic (congestive) and diastolic (congestive) heart failure: Secondary | ICD-10-CM | POA: Insufficient documentation

## 2019-12-24 LAB — ECHOCARDIOGRAM COMPLETE
Area-P 1/2: 3.21 cm2
S' Lateral: 4.2 cm

## 2019-12-24 NOTE — Telephone Encounter (Signed)
Called patient to follow up with Medicaid application to see if caseworker reached out to her. Was unable to leave a message. Will call back tomorrow on 12/25/2019.

## 2019-12-29 ENCOUNTER — Other Ambulatory Visit: Payer: Self-pay | Admitting: *Deleted

## 2019-12-29 DIAGNOSIS — I5042 Chronic combined systolic (congestive) and diastolic (congestive) heart failure: Secondary | ICD-10-CM

## 2019-12-31 ENCOUNTER — Other Ambulatory Visit: Payer: Self-pay | Admitting: Cardiology

## 2019-12-31 ENCOUNTER — Ambulatory Visit (INDEPENDENT_AMBULATORY_CARE_PROVIDER_SITE_OTHER): Payer: Self-pay | Admitting: Pharmacist

## 2019-12-31 ENCOUNTER — Other Ambulatory Visit: Payer: Self-pay

## 2019-12-31 VITALS — BP 94/60 | HR 60 | Temp 97.9°F | Resp 16 | Ht 61.0 in | Wt 100.0 lb

## 2019-12-31 DIAGNOSIS — I5020 Unspecified systolic (congestive) heart failure: Secondary | ICD-10-CM

## 2019-12-31 MED ORDER — FUROSEMIDE 20 MG PO TABS
20.0000 mg | ORAL_TABLET | ORAL | 1 refills | Status: DC
Start: 2019-12-31 — End: 2020-02-29

## 2019-12-31 MED FILL — FUROSEMIDE 20 MG TABS: 20 | 30 days supply | Qty: 15 | Fill #0

## 2019-12-31 NOTE — Patient Instructions (Addendum)
Return for a  follow up appointment NOV/16 with Dr Gardiner Rhyme  Repeat blood work : TODAY  Call medicaid case worker Sharlet Salina [phone]) for follow up  Check your blood pressure at home daily (if able) and keep record of the readings.  Take your BP meds as follows: *Decrease furosemide dose to 20mg  every other day* *Nurse will call to discuss results of blood work*  Bring all of your meds, your BP cuff and your record of home blood pressures to your next appointment.  Exercise as you're able, try to walk approximately 30 minutes per day.  Keep salt intake to a minimum, especially watch canned and prepared boxed foods.  Eat more fresh fruits and vegetables and fewer canned items.  Avoid eating in fast food restaurants.    HOW TO TAKE YOUR BLOOD PRESSURE: . Rest 5 minutes before taking your blood pressure. .  Don't smoke or drink caffeinated beverages for at least 30 minutes before. . Take your blood pressure before (not after) you eat. . Sit comfortably with your back supported and both feet on the floor (don't cross your legs). . Elevate your arm to heart level on a table or a desk. . Use the proper sized cuff. It should fit smoothly and snugly around your bare upper arm. There should be enough room to slip a fingertip under the cuff. The bottom edge of the cuff should be 1 inch above the crease of the elbow. . Ideally, take 3 measurements at one sitting and record the average.

## 2019-12-31 NOTE — Progress Notes (Signed)
Patient ID: Alexandria Buck                 DOB: 01/31/1961                      MRN: [id number]      HPI:   Alexandria Buck is a 59 y.o. female referred by Dr. Gardiner Rhyme to pharmacist clinic for [facility] titration. PMH is significant for hypertension, bipolar disorder, tobacco use, hepatitis C, and CHF. Most recent LVEF 30-30% on TEE.Patient often unsure which medications she should be on and which were discontinued.  She is a poor historian and was unable to provide home BP readings. She was approved for Medicaid and is waiting for new insurance cards. Also expected to have procedure with GI d/t dysphagia and unintentional weigh loss.  Current HTN meds:   Carvedilol 12.5mg  twice daily   Entresto 49/51mg  twice a day   Furosemide 20mg  daily   Spironolactone 12.5mg  daily  BP goal: < 130/80  Family History: The patient's family history includes Diabetes in her mother; Lung cancer in her sister.  Social History: quit smoking, occasional alcohol use  Diet: limited due to problems swallowing ; avoid canned products and frozen dinners  Exercise: playing with dog, walk dogs 5-10 min twice a day  Home BP readings: none provided  Wt Readings from Last 3 Encounters:  12/31/19 100 lb (45.4 kg)  12/21/19 97 lb 12.8 oz (44.4 kg)  12/08/19 98 lb (44.5 kg)   BP Readings from Last 3 Encounters:  12/31/19 94/60  12/21/19 118/72  12/08/19 140/82   Pulse Readings from Last 3 Encounters:  12/31/19 60  12/21/19 98  12/08/19 66    Past Medical History:  Diagnosis Date  . Acute systolic CHF (congestive heart failure) ([place])   . Alcohol abuse   . Bipolar disorder ([place])   . Depression   . Hepatitis C   . Hypertension    hx-not taking meds  . NICM (nonischemic cardiomyopathy) ([place])   . Pneumonia    had 5/12    Current Outpatient Medications on File Prior to Visit  Medication Sig Dispense Refill  . Acetaminophen-Codeine (TYLENOL/CODEINE #3) 300-30 MG tablet Take 1 tablet by mouth every 8  (eight) hours as needed for pain. 15 tablet 0  . carvedilol (COREG) 12.5 MG tablet Take 1 tablet (12.5 mg total) by mouth 2 (two) times daily. 180 tablet 3  . lithium carbonate 150 MG capsule Take 150 mg by mouth 2 (two) times daily with a meal.     . nitroGLYCERIN (NITROSTAT) 0.4 MG SL tablet Place 1 tablet (0.4 mg total) under the tongue every 5 (five) minutes as needed for chest pain. 25 tablet 3  . PARoxetine (PAXIL) 10 MG tablet Take 10 mg by mouth at bedtime.     . polyethylene glycol powder (GLYCOLAX/MIRALAX) 17 GM/SCOOP powder Take 17 g by mouth daily as needed for mild constipation. 3350 g 1  . sacubitril-valsartan (ENTRESTO) 49-51 MG Take 1 tablet by mouth 2 (two) times daily. 60 tablet 3  . spironolactone (ALDACTONE) 25 MG tablet Take 0.5 tablets (12.5 mg total) by mouth daily. 45 tablet 3   No current facility-administered medications on file prior to visit.    No Known Allergies  Blood pressure 94/60, pulse 60, temperature 97.9 F (36.6 C), resp. rate 16, height 5\' 1"  (1.549 m), weight 100 lb (45.4 kg), SpO2 91 %.  Heart failure with reduced ejection fraction (HCC) Blood pressure and HR  will limit further medication titration today. Patient denies increase fatigue, shortness of breath, or dizziness. Repeat blood work today (2 weeks after last Entresto dose adjustment), and decrease furosemide to 20mg  every other day. Patient was encouraged to bring BP readings and all current medications to follow up visit in November with Dr Gilman Schmidt.  Alexandria Buck PharmD, BCPS, [place] [address] [place] [address] 01/06/2020 6:56 PM

## 2020-01-01 LAB — BASIC METABOLIC PANEL
BUN/Creatinine Ratio: 6 — ABNORMAL LOW (ref 12–28)
BUN: 5 mg/dL — ABNORMAL LOW (ref 8–27)
CO2: 22 mmol/L (ref 20–29)
Calcium: 9.3 mg/dL (ref 8.7–10.3)
Chloride: 95 mmol/L — ABNORMAL LOW (ref 96–106)
Creatinine, Ser: 0.84 mg/dL (ref 0.57–1.00)
GFR calc Af Amer: 87 mL/min/{1.73_m2} (ref >59)
GFR calc non Af Amer: 76 mL/min/{1.73_m2} (ref >59)
Glucose: 85 mg/dL (ref 65–99)
Potassium: 5.1 mmol/L (ref 3.5–5.2)
Sodium: 129 mmol/L — ABNORMAL LOW (ref 134–144)

## 2020-01-04 ENCOUNTER — Ambulatory Visit: Payer: Self-pay

## 2020-01-05 ENCOUNTER — Other Ambulatory Visit: Payer: Self-pay

## 2020-01-05 ENCOUNTER — Ambulatory Visit: Payer: Self-pay | Attending: Internal Medicine

## 2020-01-05 DIAGNOSIS — Z23 Encounter for immunization: Secondary | ICD-10-CM

## 2020-01-05 NOTE — Progress Notes (Signed)
   Covid-19 Vaccination Clinic  Name:  Emiliya Chretien    MRN: [id number] DOB: 1960/12/02  01/05/2020  Ms. Halls was observed post Covid-19 immunization for 15 minutes without incident. She was provided with Vaccine Information Sheet and instruction to access the V-Safe system.   Ms. Mcmurry was instructed to call 911 with any severe reactions post vaccine: Marland Kitchen Difficulty breathing  . Swelling of face and throat  . A fast heartbeat  . A bad rash all over body  . Dizziness and weakness   Immunizations Administered    Name Date Dose VIS Date Route   Moderna COVID-19 Vaccine 01/05/2020  9:55 AM 0.5 mL 03/2019 Intramuscular   Manufacturer: Moderna   Lot: [id number]   [place]: [id number]

## 2020-01-06 ENCOUNTER — Other Ambulatory Visit: Payer: Self-pay

## 2020-01-06 ENCOUNTER — Telehealth: Payer: Self-pay | Admitting: Cardiology

## 2020-01-06 DIAGNOSIS — Z79899 Other long term (current) drug therapy: Secondary | ICD-10-CM

## 2020-01-06 DIAGNOSIS — I5020 Unspecified systolic (congestive) heart failure: Secondary | ICD-10-CM

## 2020-01-06 DIAGNOSIS — I10 Essential (primary) hypertension: Secondary | ICD-10-CM

## 2020-01-06 NOTE — Assessment & Plan Note (Signed)
Blood pressure and HR will limit further medication titration today. Patient denies increase fatigue, shortness of breath, or dizziness. Repeat blood work today (2 weeks after last Entresto dose adjustment), and decrease furosemide to 20mg  every other day. Patient was encouraged to bring BP readings and all current medications to follow up visit in November with Dr Gilman Schmidt.

## 2020-01-06 NOTE — Telephone Encounter (Signed)
Pt advised her lab results and verbalized understanding and will return for a repeat BMET in 1-2 weeks... she was also reminded of her consult with Dr. Caryl Comes 02/02/20.

## 2020-01-06 NOTE — Telephone Encounter (Signed)
Patient returning call for lab results. 

## 2020-01-08 ENCOUNTER — Encounter: Payer: Self-pay | Admitting: Gastroenterology

## 2020-01-08 ENCOUNTER — Other Ambulatory Visit (INDEPENDENT_AMBULATORY_CARE_PROVIDER_SITE_OTHER): Payer: Self-pay

## 2020-01-08 ENCOUNTER — Other Ambulatory Visit: Payer: Self-pay | Admitting: Gastroenterology

## 2020-01-08 ENCOUNTER — Ambulatory Visit (INDEPENDENT_AMBULATORY_CARE_PROVIDER_SITE_OTHER): Payer: Self-pay | Admitting: Gastroenterology

## 2020-01-08 VITALS — BP 132/84 | HR 68 | Ht 61.0 in | Wt 94.0 lb

## 2020-01-08 DIAGNOSIS — R12 Heartburn: Secondary | ICD-10-CM

## 2020-01-08 DIAGNOSIS — R933 Abnormal findings on diagnostic imaging of other parts of digestive tract: Secondary | ICD-10-CM

## 2020-01-08 DIAGNOSIS — R634 Abnormal weight loss: Secondary | ICD-10-CM

## 2020-01-08 DIAGNOSIS — R1310 Dysphagia, unspecified: Secondary | ICD-10-CM

## 2020-01-08 DIAGNOSIS — R1319 Other dysphagia: Secondary | ICD-10-CM

## 2020-01-08 DIAGNOSIS — Z1211 Encounter for screening for malignant neoplasm of colon: Secondary | ICD-10-CM

## 2020-01-08 DIAGNOSIS — B182 Chronic viral hepatitis C: Secondary | ICD-10-CM

## 2020-01-08 LAB — BASIC METABOLIC PANEL
BUN: 5 mg/dL — ABNORMAL LOW (ref 6–23)
CO2: 25 mEq/L (ref 19–32)
Calcium: 9.5 mg/dL (ref 8.4–10.5)
Chloride: 100 mEq/L (ref 96–112)
Creatinine, Ser: 0.78 mg/dL (ref 0.40–1.20)
GFR: 90.89 mL/min (ref >60.00)
Glucose, Bld: 78 mg/dL (ref 70–99)
Potassium: 4 mEq/L (ref 3.5–5.1)
Sodium: 132 mEq/L — ABNORMAL LOW (ref 135–145)

## 2020-01-08 LAB — CBC WITH DIFFERENTIAL/PLATELET
Basophils Absolute: 0 10*3/uL (ref 0.0–0.1)
Basophils Relative: 0.7 % (ref 0.0–3.0)
Eosinophils Absolute: 0.1 10*3/uL (ref 0.0–0.7)
Eosinophils Relative: 0.9 % (ref 0.0–5.0)
HCT: 42.2 % (ref 36.0–46.0)
Hemoglobin: 14.5 g/dL (ref 12.0–15.0)
Lymphocytes Relative: 21.9 % (ref 12.0–46.0)
Lymphs Abs: 1.5 10*3/uL (ref 0.7–4.0)
MCHC: 34.5 g/dL (ref 30.0–36.0)
MCV: 91.9 fl (ref 78.0–100.0)
Monocytes Absolute: 0.7 10*3/uL (ref 0.1–1.0)
Monocytes Relative: 9.6 % (ref 3.0–12.0)
Neutro Abs: 4.7 10*3/uL (ref 1.4–7.7)
Neutrophils Relative %: 66.9 % (ref 43.0–77.0)
Platelets: 236 10*3/uL (ref 150.0–400.0)
RBC: 4.59 Mil/uL (ref 3.87–5.11)
RDW: 12.7 % (ref 11.5–15.5)
WBC: 7 10*3/uL (ref 4.0–10.5)

## 2020-01-08 LAB — PROTIME-INR
INR: 1.1 ratio — ABNORMAL HIGH (ref 0.8–1.0)
Prothrombin Time: 12.3 s (ref 9.6–13.1)

## 2020-01-08 MED ORDER — SUPREP BOWEL PREP KIT 17.5-3.13-1.6 GM/177ML PO SOLN: 1.0000 | ORAL | 0 refills | Status: DC

## 2020-01-08 MED FILL — SUPREP BOWEL PREP KIT: 17.5-3.13-1 | 1 days supply | Qty: 354 | Fill #0

## 2020-01-08 NOTE — Patient Instructions (Signed)
You have been scheduled for an endoscopy and colonoscopy. Please follow the written instructions given to you at your visit today. Please pick up your prep supplies at the pharmacy within the next 1-3 days. If you use inhalers (even only as needed), please bring them with you on the day of your procedure.   We have sent the following medications to your pharmacy for you to pick up at your convenience: [facility] provider has requested that you go to the basement level for lab work before leaving today. Press "B" on the elevator. The lab is located at the first door on the left as you exit the elevator.  Due to recent changes in healthcare laws, you may see the results of your imaging and laboratory studies on MyChart before your provider has had a chance to review them.  We understand that in some cases there may be results that are confusing or concerning to you. Not all laboratory results come back in the same time frame and the provider may be waiting for multiple results in order to interpret others.  Please give [place] 48 hours in order for your provider to thoroughly review all the results before contacting the office for clarification of your results.   If you are age 80 or older, your body mass index should be between 23-30. Your Body mass index is 17.76 kg/m. If this is out of the aforementioned range listed, please consider follow up with your Primary Care Provider.  If you are age 32 or younger, your body mass index should be between 19-25. Your Body mass index is 17.76 kg/m. If this is out of the aformentioned range listed, please consider follow up with your Primary Care Provider.    Thank you for choosing me and [facility].  Dr. Rush Landmark
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
## 2020-01-10 DIAGNOSIS — R933 Abnormal findings on diagnostic imaging of other parts of digestive tract: Secondary | ICD-10-CM | POA: Insufficient documentation

## 2020-01-10 DIAGNOSIS — R12 Heartburn: Secondary | ICD-10-CM | POA: Insufficient documentation

## 2020-01-10 DIAGNOSIS — R1319 Other dysphagia: Secondary | ICD-10-CM | POA: Insufficient documentation

## 2020-01-10 DIAGNOSIS — Z1211 Encounter for screening for malignant neoplasm of colon: Secondary | ICD-10-CM | POA: Insufficient documentation

## 2020-01-11 ENCOUNTER — Encounter: Payer: Self-pay | Admitting: *Deleted

## 2020-01-11 ENCOUNTER — Other Ambulatory Visit (HOSPITAL_COMMUNITY): Payer: Self-pay

## 2020-01-11 ENCOUNTER — Ambulatory Visit (INDEPENDENT_AMBULATORY_CARE_PROVIDER_SITE_OTHER): Payer: Self-pay | Admitting: Internal Medicine

## 2020-01-11 ENCOUNTER — Telehealth: Payer: Self-pay | Admitting: Gastroenterology

## 2020-01-11 ENCOUNTER — Telehealth: Payer: Self-pay | Admitting: *Deleted

## 2020-01-11 ENCOUNTER — Encounter: Payer: Self-pay | Admitting: Internal Medicine

## 2020-01-11 ENCOUNTER — Other Ambulatory Visit: Payer: Self-pay

## 2020-01-11 VITALS — BP 120/80 | HR 58 | Ht 61.0 in | Wt 98.4 lb

## 2020-01-11 DIAGNOSIS — I428 Other cardiomyopathies: Secondary | ICD-10-CM

## 2020-01-11 DIAGNOSIS — R002 Palpitations: Secondary | ICD-10-CM

## 2020-01-11 DIAGNOSIS — I5020 Unspecified systolic (congestive) heart failure: Secondary | ICD-10-CM

## 2020-01-11 NOTE — Progress Notes (Signed)
Patient ID: Alexandria Buck, female   DOB: 1960/04/20, 60 y.o.   MRN: [id number] Patient enrolled for [employer] to ship a 14 day ZIO XT long term holter monitor to her home.
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
## 2020-01-11 NOTE — Telephone Encounter (Signed)
The pt has been advised to go as soon as possible tomorrow morning for COVID testing.  She has been advised if testing is not completed she will not be able to have procedure. The pt has been advised of the information and verbalized understanding.

## 2020-01-11 NOTE — Patient Instructions (Signed)
Medication Instructions:  Your physician recommends that you continue on your current medications as directed. Please refer to the Current Medication list given to you today.  *If you need a refill on your cardiac medications before your next appointment, please call your pharmacy*   Lab Work: None ordered.  If you have labs (blood work) drawn today and your tests are completely normal, you will receive your results only by: Marland Kitchen MyChart Message (if you have MyChart) OR . A paper copy in the mail If you have any lab test that is abnormal or we need to change your treatment, we will call you to review the results.   Testing/Procedures: Your physician has recommended that you have a defibrillator inserted. An implantable cardioverter defibrillator (ICD) is a small device that is placed in your chest or, in rare cases, your abdomen. This device uses electrical pulses or shocks to help control life-threatening, irregular heartbeats that could lead the heart to suddenly stop beating (sudden cardiac arrest). Leads are attached to the ICD that goes into your heart. This is done in the hospital and usually requires an overnight stay. Please see the instruction sheet given to you today for more information.     Follow-Up: At [facility], you and your health needs are our priority.  As part of our continuing mission to provide you with exceptional heart care, we have created designated Provider Care Teams.  These Care Teams include your primary Cardiologist (physician) and Advanced Practice Providers (APPs -  Physician Assistants and Nurse Practitioners) who all work together to provide you with the care you need, when you need it.  We recommend signing up for the patient portal called "MyChart".  Sign up information is provided on this After Visit Summary.  MyChart is used to connect with patients for Virtual Visits (Telemedicine).  Patients are able to view lab/test results, encounter notes, upcoming  appointments, etc.  Non-urgent messages can be sent to your provider as well.   To learn more about what you can do with MyChart, go to [email or web address].    Your next appointment:   To be scheduled   Other Instructions ZIO XT- Long Term Monitor Instructions   Your physician has requested you wear your ZIO patch monitor____14___days.   This is a single patch monitor.  [employer] supplies one patch monitor per enrollment.  Additional stickers are not available.   Please do not apply patch if you will be having a Nuclear Stress Test, Echocardiogram, Cardiac CT, MRI, or Chest Xray during the time frame you would be wearing the monitor. The patch cannot be worn during these tests.  You cannot remove and re-apply the ZIO XT patch monitor.   Your ZIO patch monitor will be sent USPS Priority mail from [facility] directly to your home address. The monitor may also be mailed to a PO BOX if home delivery is not available.   It may take 3-5 days to receive your monitor after you have been enrolled.   Once you have received you monitor, please review enclosed instructions.  Your monitor has already been registered assigning a specific monitor serial # to you.   Applying the monitor   Shave hair from upper left chest.   Hold abrader disc by orange tab.  Rub abrader in 40 strokes over left upper chest as indicated in your monitor instructions.   Clean area with 4 enclosed alcohol pads .  Use all pads to assure are is cleaned thoroughly.  Let dry.  Apply patch as indicated in monitor instructions.  Patch will be place under collarbone on left side of chest with arrow pointing upward.   Rub patch adhesive wings for 2 minutes.Remove white label marked "1".  Remove white label marked "2".  Rub patch adhesive wings for 2 additional minutes.   While looking in a mirror, press and release button in center of patch.  A small green light will flash 3-4 times .  This will be your only indicator  the monitor has been turned on.     Do not shower for the first 24 hours.  You may shower after the first 24 hours.   Press button if you feel a symptom. You will hear a small click.  Record Date, Time and Symptom in the Patient Log Book.   When you are ready to remove patch, follow instructions on last 2 pages of Patient Log Book.  Stick patch monitor onto last page of Patient Log Book.   Place Patient Log Book in [place] box.  Use locking tab on box and tape box closed securely.  The [employer] has [employer] on it.  Please place in mailbox as soon as possible.  Your physician should have your test results approximately 7 days after the monitor has been mailed back to [facility].   Call [place] at [phone] if you have questions regarding your ZIO XT patch monitor.  Call them immediately if you see an orange light blinking on your monitor.   If your monitor falls off in less than 4 days contact our Monitor department at [phone].  If your monitor becomes loose or falls off after 4 days call [employer] at [phone] for suggestions on securing your monitor.

## 2020-01-11 NOTE — Telephone Encounter (Signed)
Attempted to contact Alexandria Buck 3 times to provide information on how to apply for [employer] financial assistance program.  Patient unable to take instruction first two attempt.  No answer , mail box full 3rd attempt. Will send instructions by letter.

## 2020-01-11 NOTE — Telephone Encounter (Signed)
Patient calling to reschedule her Covid test appointment she missed it today

## 2020-01-12 ENCOUNTER — Other Ambulatory Visit (HOSPITAL_COMMUNITY)
Admission: RE | Admit: 2020-01-12 | Discharge: 2020-01-12 | Disposition: A | Discharge status: Home / Self Care | Payer: HRSA Program | Source: Ambulatory Visit | Attending: Gastroenterology | Admitting: Gastroenterology

## 2020-01-12 DIAGNOSIS — Z01812 Encounter for preprocedural laboratory examination: Secondary | ICD-10-CM | POA: Insufficient documentation

## 2020-01-12 DIAGNOSIS — Z20822 Contact with and (suspected) exposure to covid-19: Secondary | ICD-10-CM | POA: Insufficient documentation

## 2020-01-12 LAB — SARS CORONAVIRUS 2 (TAT 6-24 HRS): SARS Coronavirus 2: NEGATIVE

## 2020-01-13 ENCOUNTER — Other Ambulatory Visit: Payer: Self-pay

## 2020-01-13 ENCOUNTER — Encounter (HOSPITAL_COMMUNITY): Payer: Self-pay | Admitting: Gastroenterology

## 2020-01-13 NOTE — Progress Notes (Signed)
PCP - Dr Karle Plumber Cardiologist - Dr Oswaldo Milian  Chest x-ray - 06/25/19 (2V) EKG - 01/11/20 Stress Test - n/a ECHO - 12/24/19 Cardiac Cath - 06/25/19  Anesthesia review: Yes  STOP now taking any Aspirin (unless otherwise instructed by your surgeon), Aleve, Naproxen, Ibuprofen, Motrin, Advil, Goody's, BC's, all herbal medications, fish oil, and all vitamins.   Coronavirus Screening Covid test on 01/12/20 was negative.  Patient verbalized understanding of instructions that were given via phone.

## 2020-01-13 NOTE — Progress Notes (Signed)
Anesthesia Chart Review: Same-day work-up  Follows with cardiology for recently diagnosed nonischemic cardiomyopathy and combined systolic and diastolic heart failure.  Patient was admitted to [facility] from 2/821 through 05/19/19 after presenting with nausea/vomiting and dizziness.  She was felt to have acute vestibular migraine and was also treated for UTI.  TTE was done on 05/19/2019, which showed EF 25 to 30%, global hypokinesis, grade 2 diastolic dysfunction, mild RV systolic dysfunction, mild to moderate mitral regurgitation.  This was a new diagnosis of systolic heart failure. LHC/RHC was done to work-up her heart failure on 06/25/2019.  That showed no evidence of CAD, but did have elevated right heart pressures(RA 14, RV 54/8/14, PA 57/25/37, PCWP 26).  She was admitted for IV diuresis.  She was discharged on 06/27/2019 on Coreg 3.25 mg twice daily, losartan 25 mg daily, spironolactone 12.5 mg daily, Lasix 40 mg daily.   Last seen by Dr. Gardiner Rhyme 12/08/2019 and discussed that she had been having dysphagia since last visit and an discussed that upcoming EGD was planned.    She was referred to EP for evaluation of ICD placement.  Seen by Dr. Caryl Comes 01/11/2020.  Per note she remains limited in activity, shortness of breath at less than 100 yards.  No orthopnea or nocturnal dyspnea.  No edema.  She does also report frequent palpitations.  ICD was discussed, and patient did elect to proceed.  Event monitor was ordered to characterize palpitations, this has not been completed yet.  History hepatitis C, has been seen by infectious disease and Harvoni was planned but patient did not pick up medication.  BMP and CBC from 01/08/2020 reviewed, sodium mildly low at 132, otherwise unremarkable.  We will need day of surgery evaluation.  EKG 01/11/2020: Sinus bradycardia.  Rate 58.  TTE 12/24/2019: 1. Left ventricular ejection fraction, by estimation, is 20 to 25%. The  left ventricle has severely decreased function.  The left ventricle  demonstrates global hypokinesis. Left ventricular diastolic parameters are  consistent with Grade I diastolic  dysfunction (impaired relaxation).  2. Right ventricular systolic function is mildly reduced. The right  ventricular size is normal. Tricuspid regurgitation signal is inadequate  for assessing PA pressure.  3. Left atrial size was mildly dilated.  4. The mitral valve is normal in structure. Moderate central mitral valve  regurgitation, likely functional. No evidence of mitral stenosis.  5. The aortic valve is tricuspid. Aortic valve regurgitation is not  visualized. No aortic stenosis is present.  6. The inferior vena cava is normal in size with greater than 50%  respiratory variability, suggesting right atrial pressure of 3 mmHg.  7. Consider referral to CHF clinic.   Left/right heart cath 06/25/2019: 1. No angiographic evidence of CAD 2. Elevated right heart pressures (RA 14, RV 54/8/14, PA 57/25/37, PCWP 26).    Wynonia Musty [facility] Short Stay Center/Anesthesiology Phone [phone] 01/13/2020 9:55 AM

## 2020-01-13 NOTE — Anesthesia Preprocedure Evaluation (Addendum)
Anesthesia Evaluation  Patient identified by MRN, date of birth, ID band Patient awake    Reviewed: Allergy & Precautions, H&P , NPO status , Patient's Chart, lab work & pertinent test results  Airway Mallampati: II  TM Distance: >3 FB Neck ROM: Full    Dental no notable dental hx. (+) Edentulous Upper, Edentulous Lower, Dental Advisory Given   Pulmonary neg pulmonary ROS, former smoker,    Pulmonary exam normal breath sounds clear to auscultation       Cardiovascular Exercise Tolerance: Good hypertension, Pt. on medications and Pt. on home beta blockers +CHF   Rhythm:Regular Rate:Normal     Neuro/Psych Depression Bipolar Disorder negative neurological ROS     GI/Hepatic negative GI ROS, (+) Hepatitis -, C  Endo/Other  negative endocrine ROS  Renal/GU negative Renal ROS  negative genitourinary   Musculoskeletal   Abdominal   Peds  Hematology negative hematology ROS (+)   Anesthesia Other Findings   Reproductive/Obstetrics negative OB ROS                           Anesthesia Physical Anesthesia Plan  ASA: IV  Anesthesia Plan: MAC   Post-op Pain Management:    Induction: Intravenous  PONV Risk Score and Plan: 2 and Midazolam and Ondansetron  Airway Management Planned: Nasal Cannula  Additional Equipment:   Intra-op Plan:   Post-operative Plan:   Informed Consent: I have reviewed the patients History and Physical, chart, labs and discussed the procedure including the risks, benefits and alternatives for the proposed anesthesia with the patient or authorized representative who has indicated his/her understanding and acceptance.     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  Patient was  admitted to [facility] from 2/821 through 05/19/19 after presenting with nausea/vomiting and dizziness.  She was felt to have acute vestibular migraine and was also treated for UTI.  TTE was done on 05/19/2019, which showed EF 25 to 30%, global hypokinesis, grade 2 diastolic dysfunction, mild RV systolic dysfunction, mild to moderate mitral regurgitation.  This was a new diagnosis of systolic heart failure. LHC/RHC was done to work-up her heart failure on 06/25/2019.  That showed no evidence of CAD, but did have elevated right heart pressures(RA 14, RV 54/8/14, PA 57/25/37, PCWP 26).  She was admitted for IV diuresis.  She was discharged on 06/27/2019 on Coreg 3.25 mg twice daily, losartan 25 mg daily, spironolactone 12.5 mg daily, Lasix 40 mg daily.   Last seen by Dr. Gardiner Rhyme 12/08/2019 and discussed that she had been having dysphagia since last visit and an discussed that upcoming EGD was planned.    She was referred to EP for evaluation of ICD placement.  Seen by Dr. Caryl Comes 01/11/2020.  Per note she remains limited in activity, shortness of breath at less than 100 yards.  No orthopnea or nocturnal dyspnea.  No edema.  She does also report frequent palpitations.  ICD was discussed, and patient did elect to proceed.  Event monitor was ordered to characterize palpitations, this has not been completed yet.  History hepatitis C, has been seen by infectious disease and Harvoni was planned but patient did not pick up medication.  BMP and CBC from 01/08/2020 reviewed, sodium mildly low at 132, otherwise unremarkable.  We will need day of surgery evaluation.  EKG  01/11/2020: Sinus bradycardia.  Rate 58.  TTE 12/24/2019: 1. Left ventricular ejection fraction, by estimation, is 20 to 25%. The  left ventricle has severely decreased function. The left ventricle  demonstrates global hypokinesis. Left ventricular diastolic parameters are  consistent with Grade I diastolic  dysfunction (impaired relaxation).  2. Right  ventricular systolic function is mildly reduced. The right  ventricular size is normal. Tricuspid regurgitation signal is inadequate  for assessing PA pressure.  3. Left atrial size was mildly dilated.  4. The mitral valve is normal in structure. Moderate central mitral valve  regurgitation, likely functional. No evidence of mitral stenosis.  5. The aortic valve is tricuspid. Aortic valve regurgitation is not  visualized. No aortic stenosis is present.  6. The inferior vena cava is normal in size with greater than 50%  respiratory variability, suggesting right atrial pressure of 3 mmHg.  7. Consider referral to CHF clinic.   Left/right heart cath 06/25/2019: 1. No angiographic evidence of CAD 2. Elevated right heart pressures (RA 14, RV 54/8/14, PA 57/25/37, PCWP 26).  )      Anesthesia Quick Evaluation

## 2020-01-14 ENCOUNTER — Ambulatory Visit (HOSPITAL_COMMUNITY): Payer: Self-pay | Admitting: Physician Assistant

## 2020-01-14 ENCOUNTER — Ambulatory Visit (INDEPENDENT_AMBULATORY_CARE_PROVIDER_SITE_OTHER): Payer: Self-pay

## 2020-01-14 ENCOUNTER — Ambulatory Visit (HOSPITAL_COMMUNITY): Payer: Self-pay

## 2020-01-14 ENCOUNTER — Ambulatory Visit (HOSPITAL_COMMUNITY)
Admission: RE | Admit: 2020-01-14 | Discharge: 2020-01-14 | Disposition: A | Discharge status: Home / Self Care | Payer: Self-pay | Attending: Gastroenterology | Admitting: Gastroenterology

## 2020-01-14 ENCOUNTER — Encounter (HOSPITAL_COMMUNITY)
Admission: RE | Disposition: A | Discharge status: Home / Self Care | Payer: Self-pay | Source: Home / Self Care | Attending: Gastroenterology

## 2020-01-14 ENCOUNTER — Other Ambulatory Visit: Payer: Self-pay

## 2020-01-14 ENCOUNTER — Encounter (HOSPITAL_COMMUNITY): Payer: Self-pay | Admitting: Gastroenterology

## 2020-01-14 DIAGNOSIS — K641 Second degree hemorrhoids: Secondary | ICD-10-CM | POA: Insufficient documentation

## 2020-01-14 DIAGNOSIS — R1314 Dysphagia, pharyngoesophageal phase: Secondary | ICD-10-CM | POA: Insufficient documentation

## 2020-01-14 DIAGNOSIS — B9681 Helicobacter pylori [H. pylori] as the cause of diseases classified elsewhere: Secondary | ICD-10-CM | POA: Insufficient documentation

## 2020-01-14 DIAGNOSIS — B182 Chronic viral hepatitis C: Secondary | ICD-10-CM | POA: Insufficient documentation

## 2020-01-14 DIAGNOSIS — Q438 Other specified congenital malformations of intestine: Secondary | ICD-10-CM | POA: Insufficient documentation

## 2020-01-14 DIAGNOSIS — Z1211 Encounter for screening for malignant neoplasm of colon: Secondary | ICD-10-CM | POA: Insufficient documentation

## 2020-01-14 DIAGNOSIS — K222 Esophageal obstruction: Secondary | ICD-10-CM | POA: Insufficient documentation

## 2020-01-14 DIAGNOSIS — I428 Other cardiomyopathies: Secondary | ICD-10-CM | POA: Insufficient documentation

## 2020-01-14 DIAGNOSIS — I110 Hypertensive heart disease with heart failure: Secondary | ICD-10-CM | POA: Insufficient documentation

## 2020-01-14 DIAGNOSIS — F319 Bipolar disorder, unspecified: Secondary | ICD-10-CM | POA: Insufficient documentation

## 2020-01-14 DIAGNOSIS — R933 Abnormal findings on diagnostic imaging of other parts of digestive tract: Secondary | ICD-10-CM

## 2020-01-14 DIAGNOSIS — K644 Residual hemorrhoidal skin tags: Secondary | ICD-10-CM | POA: Insufficient documentation

## 2020-01-14 DIAGNOSIS — R1013 Epigastric pain: Secondary | ICD-10-CM | POA: Insufficient documentation

## 2020-01-14 DIAGNOSIS — K219 Gastro-esophageal reflux disease without esophagitis: Secondary | ICD-10-CM | POA: Insufficient documentation

## 2020-01-14 DIAGNOSIS — Z681 Body mass index (BMI) 19 or less, adult: Secondary | ICD-10-CM | POA: Insufficient documentation

## 2020-01-14 DIAGNOSIS — Z79899 Other long term (current) drug therapy: Secondary | ICD-10-CM | POA: Insufficient documentation

## 2020-01-14 DIAGNOSIS — R6881 Early satiety: Secondary | ICD-10-CM | POA: Insufficient documentation

## 2020-01-14 DIAGNOSIS — K2950 Unspecified chronic gastritis without bleeding: Secondary | ICD-10-CM | POA: Insufficient documentation

## 2020-01-14 DIAGNOSIS — R1310 Dysphagia, unspecified: Secondary | ICD-10-CM

## 2020-01-14 DIAGNOSIS — F419 Anxiety disorder, unspecified: Secondary | ICD-10-CM | POA: Insufficient documentation

## 2020-01-14 DIAGNOSIS — Z87891 Personal history of nicotine dependence: Secondary | ICD-10-CM | POA: Insufficient documentation

## 2020-01-14 DIAGNOSIS — R112 Nausea with vomiting, unspecified: Secondary | ICD-10-CM | POA: Insufficient documentation

## 2020-01-14 DIAGNOSIS — R634 Abnormal weight loss: Secondary | ICD-10-CM | POA: Insufficient documentation

## 2020-01-14 DIAGNOSIS — C154 Malignant neoplasm of middle third of esophagus: Secondary | ICD-10-CM | POA: Insufficient documentation

## 2020-01-14 DIAGNOSIS — R002 Palpitations: Secondary | ICD-10-CM

## 2020-01-14 DIAGNOSIS — Z9114 Patient's other noncompliance with medication regimen: Secondary | ICD-10-CM | POA: Insufficient documentation

## 2020-01-14 DIAGNOSIS — R1319 Other dysphagia: Secondary | ICD-10-CM

## 2020-01-14 DIAGNOSIS — D490 Neoplasm of unspecified behavior of digestive system: Secondary | ICD-10-CM

## 2020-01-14 DIAGNOSIS — I5040 Unspecified combined systolic (congestive) and diastolic (congestive) heart failure: Secondary | ICD-10-CM | POA: Insufficient documentation

## 2020-01-14 HISTORY — PX: COLONOSCOPY WITH PROPOFOL: SHX5780

## 2020-01-14 HISTORY — PX: ESOPHAGEAL DILATION: SHX303

## 2020-01-14 HISTORY — PX: ESOPHAGOGASTRODUODENOSCOPY (EGD) WITH PROPOFOL: SHX5813

## 2020-01-14 HISTORY — PX: BIOPSY: SHX5522

## 2020-01-14 SURGERY — COLONOSCOPY WITH PROPOFOL
Anesthesia: Monitor Anesthesia Care

## 2020-01-14 MED ORDER — OMEPRAZOLE 40 MG PO CPDR: 40.0000 mg | DELAYED_RELEASE_CAPSULE | Freq: Two times a day (BID) | ORAL | 6 refills | Status: DC

## 2020-01-14 MED ORDER — LIDOCAINE 2% (20 MG/ML) 5 ML SYRINGE
INTRAMUSCULAR | Status: DC | PRN
  Administered 2020-01-14: 40 mg via INTRAVENOUS

## 2020-01-14 MED ORDER — PHENYLEPHRINE HCL-NACL 10-0.9 MG/250ML-% IV SOLN
INTRAVENOUS | Status: DC | PRN
  Administered 2020-01-14: 25 ug/min via INTRAVENOUS

## 2020-01-14 MED ORDER — KETAMINE HCL 50 MG/5ML IJ SOSY
PREFILLED_SYRINGE | INTRAMUSCULAR | Status: AC
  Filled 2020-01-14: qty 5

## 2020-01-14 MED ORDER — PROPOFOL 500 MG/50ML IV EMUL
INTRAVENOUS | Status: DC | PRN
  Administered 2020-01-14: 30 ug/kg/min via INTRAVENOUS

## 2020-01-14 MED ORDER — GLYCOPYRROLATE 0.2 MG/ML IJ SOLN
INTRAMUSCULAR | Status: DC | PRN
  Administered 2020-01-14: .1 mg via INTRAVENOUS

## 2020-01-14 MED ORDER — KETAMINE HCL 10 MG/ML IJ SOLN
INTRAMUSCULAR | Status: DC | PRN
  Administered 2020-01-14 (×2): 10 mg via INTRAVENOUS

## 2020-01-14 MED ORDER — ONDANSETRON HCL 4 MG/2ML IJ SOLN
INTRAMUSCULAR | Status: DC | PRN
  Administered 2020-01-14: 4 mg via INTRAVENOUS

## 2020-01-14 MED ORDER — SODIUM CHLORIDE 0.9 % IV SOLN: INTRAVENOUS | Status: DC

## 2020-01-14 MED ORDER — LACTATED RINGERS IV SOLN: INTRAVENOUS | Status: DC | PRN

## 2020-01-14 MED ORDER — LACTATED RINGERS IV SOLN: Freq: Once | INTRAVENOUS | Status: DC

## 2020-01-14 MED ORDER — PROPOFOL 10 MG/ML IV BOLUS
INTRAVENOUS | Status: DC | PRN
  Administered 2020-01-14: 30 mg via INTRAVENOUS

## 2020-01-14 MED FILL — OMEPRAZOLE DR 40 MG CAPSULE: 40 | 30 days supply | Qty: 60 | Fill #0

## 2020-01-14 SURGICAL SUPPLY — 24 items

## 2020-01-14 NOTE — Discharge Instructions (Signed)
YOU HAD AN ENDOSCOPIC PROCEDURE TODAY: Refer to the procedure report and other information in the discharge instructions given to you for any specific questions about what was found during the examination. If this information does not answer your questions, please call [place] office at [phone] to clarify.  ° °YOU SHOULD EXPECT: Some feelings of bloating in the abdomen. Passage of more gas than usual. Walking can help get rid of the air that was put into your GI tract during the procedure and reduce the bloating. If you had a lower endoscopy (such as a colonoscopy or flexible sigmoidoscopy) you may notice spotting of blood in your stool or on the toilet paper. Some abdominal soreness may be present for a day or two, also. ° °DIET: Your first meal following the procedure should be a light meal and then it is ok to progress to your normal diet. A half-sandwich or bowl of soup is an example of a good first meal. Heavy or fried foods are harder to digest and may make you feel nauseous or bloated. Drink plenty of fluids but you should avoid alcoholic beverages for 24 hours. If you had a esophageal dilation, please see attached instructions for diet.   ° °ACTIVITY: Your care partner should take you home directly after the procedure. You should plan to take it easy, moving slowly for the rest of the day. You can resume normal activity the day after the procedure however YOU SHOULD NOT DRIVE, use power tools, machinery or perform tasks that involve climbing or major physical exertion for 24 hours (because of the sedation medicines used during the test).  ° °SYMPTOMS TO REPORT IMMEDIATELY: °A gastroenterologist can be reached at any hour. Please call [phone]  for any of the following symptoms:  °Following lower endoscopy (colonoscopy, flexible sigmoidoscopy) °Excessive amounts of blood in the stool  °Significant tenderness, worsening of abdominal pains  °Swelling of the abdomen that is new, acute  °Fever of 100° or  higher  °Following upper endoscopy (EGD, EUS, ERCP, esophageal dilation) °Vomiting of blood or coffee ground material  °New, significant abdominal pain  °New, significant chest pain or pain under the shoulder blades  °Painful or persistently difficult swallowing  °New shortness of breath  °Black, tarry-looking or red, bloody stools ° °FOLLOW UP:  °If any biopsies were taken you will be contacted by phone or by letter within the next 1-3 weeks. Call [phone]  if you have not heard about the biopsies in 3 weeks.  °Please also call with any specific questions about appointments or follow up tests. ° °

## 2020-01-14 NOTE — Op Note (Addendum)
[facility] Patient Name: Alexandria Buck Procedure Date : 01/14/2020 MRN: [id number] Attending MD: Justice Britain , MD Date of Birth: December 19, 1960 CSN: [id number] Age: 59 Admit Type: Inpatient Procedure:                Colonoscopy Indications:              Screening for colorectal malignant neoplasm Providers:                Justice Britain, MD, Burtis Junes, RN, Elspeth Cho Tech., Technician, Cira Servant, CRNA Referring MD:             Ladell Pier Medicines:                Monitored Anesthesia Care Complications:            No immediate complications. Estimated Blood Loss:     Estimated blood loss was minimal. Procedure:                Pre-Anesthesia Assessment:                           - Prior to the procedure, a History and Physical                            was performed, and patient medications and                            allergies were reviewed. The patient's tolerance of                            previous anesthesia was also reviewed. The risks                            and benefits of the procedure and the sedation                            options and risks were discussed with the patient.                            All questions were answered, and informed consent                            was obtained. Prior Anticoagulants: The patient has                            taken no previous anticoagulant or antiplatelet                            agents. ASA Grade Assessment: III - A patient with                            severe systemic disease. After reviewing the risks  and benefits, the patient was deemed in                            satisfactory condition to undergo the procedure.                           After obtaining informed consent, the colonoscope                            was passed under direct vision. Throughout the                            procedure, the patient's blood  pressure, pulse, and                            oxygen saturations were monitored continuously. The                            [id number]-H190DL ([id number]) Olympus pediatric colonscope                            was introduced through the anus and advanced to the                            the cecum, identified by appendiceal orifice and                            ileocecal valve. The colonoscopy was performed                            without difficulty. The patient tolerated the                            procedure. The quality of the bowel preparation was                            adequate. The ileocecal valve, appendiceal orifice,                            and rectum were photographed. Scope In: Scope Out: Findings:      The digital rectal exam findings include hemorrhoids. Pertinent       negatives include no palpable rectal lesions.      A moderate amount of semi-liquid stool was found in the entire colon,       interfering with visualization. Lavage of the area was performed using       copious amounts, resulting in clearance with adequate visualization.      The colon (entire examined portion) was moderately redundant.      Normal mucosa was found in the entire colon otherwise.      Non-bleeding non-thrombosed external and internal hemorrhoids were found       during retroflexion, during perianal exam and during digital exam. The       hemorrhoids were Grade II (internal hemorrhoids that prolapse but reduce       spontaneously). Impression:               -  Hemorrhoids found on digital rectal exam.                           - Stool in the entire examined colon.                           - Redundant colon.                           - Normal mucosa in the entire examined colon                            otherwise.                           - Non-bleeding non-thrombosed external and internal                            hemorrhoids. Recommendation:           - The patient will be observed  post-procedure,                            until all discharge criteria are met.                           - Discharge patient to home.                           - Patient has a contact number available for                            emergencies. The signs and symptoms of potential                            delayed complications were discussed with the                            patient. Return to normal activities tomorrow.                            Written discharge instructions were provided to the                            patient.                           - Diet as per EGD notation.                           - Continue present medications.                           - Repeat colonoscopy in 10 years for screening                            purposes.                           -  The findings and recommendations were discussed                            with the patient.                           - The findings and recommendations were discussed                            with the patient's family. Procedure Code(s):        --- Professional ---                           [phone], Colonoscopy, flexible; diagnostic, including                            collection of specimen(s) by brushing or washing,                            when performed (separate procedure) Diagnosis Code(s):        --- Professional ---                           Z12.11, Encounter for screening for malignant                            neoplasm of colon                           K64.1, Second degree hemorrhoids                           Q43.8, Other specified congenital malformations of                            intestine CPT copyright 2019 [employer]. All rights reserved. The codes documented in this report are preliminary and upon coder review may  be revised to meet current compliance requirements. Justice Britain, MD 01/14/2020 9:47:12 AM Number of Addenda: 0

## 2020-01-14 NOTE — Transfer of Care (Signed)
Immediate Anesthesia Transfer of Care Note  Patient: Alexandria Buck  Procedure(s) Performed: COLONOSCOPY WITH PROPOFOL (N/A ) ESOPHAGOGASTRODUODENOSCOPY (EGD) WITH PROPOFOL (N/A ) ESOPHAGEAL DILATION BIOPSY  Patient Location: PACU and Endoscopy Unit  Anesthesia Type:MAC  Level of Consciousness: awake and patient cooperative  Airway & Oxygen Therapy: Patient Spontanous Breathing and Patient connected to nasal cannula oxygen  Post-op Assessment: Report given to RN and Post -op Vital signs reviewed and stable  Post vital signs: Reviewed and stable  Last Vitals:  Vitals Value Taken Time  BP 123/67 01/14/20 0925  Temp 36.5 C 01/14/20 0912  Pulse 76 01/14/20 0926  Resp 25 01/14/20 0926  SpO2 97 % 01/14/20 0926  Vitals shown include unvalidated device data.  Last Pain:  Vitals:   01/14/20 0925  TempSrc:   PainSc: 0-No pain         Complications: No complications documented.

## 2020-01-14 NOTE — Op Note (Addendum)
[facility] Patient Name: Alexandria Buck Procedure Date : 01/14/2020 MRN: [id number] Attending MD: Justice Britain , MD Date of Birth: [birth year] CSN: [id number] Age: 59 Admit Type: Inpatient Procedure:                Upper GI endoscopy Indications:              Epigastric abdominal pain, Dysphagia, Abnormal UGI                            series, Nausea with vomiting, Regurgitation, Weight                            loss Providers:                Justice Britain, MD, Burtis Junes, RN, Elspeth Cho Tech., Technician, Cira Servant, CRNA Referring MD:             Ladell Pier Medicines:                Monitored Anesthesia Care Complications:            No immediate complications. Estimated Blood Loss:     Estimated blood loss was minimal. Estimated blood                            loss: none. Procedure:                Pre-Anesthesia Assessment:                           - Prior to the procedure, a History and Physical                            was performed, and patient medications and                            allergies were reviewed. The patient's tolerance of                            previous anesthesia was also reviewed. The risks                            and benefits of the procedure and the sedation                            options and risks were discussed with the patient.                            All questions were answered, and informed consent                            was obtained. Prior Anticoagulants: The patient has  taken no previous anticoagulant or antiplatelet                            agents. ASA Grade Assessment: III - A patient with                            severe systemic disease. After reviewing the risks                            and benefits, the patient was deemed in                            satisfactory condition to undergo the procedure.                            After obtaining informed consent, the endoscope was                            passed under direct vision. Throughout the                            procedure, the patient's blood pressure, pulse, and                            oxygen saturations were monitored continuously. The                            [id number]-H190 ([id number]) Olympus gastroscope was                            introduced through the mouth, and advanced to the                            cricopharyngeal esophagus. The Endoscope was                            introduced through the mouth, and advanced to the                            second part of duodenum. The upper GI endoscopy was                            performed with moderate difficulty due to stenosis.                            Successful completion of the procedure was aided by                            withdrawing the scope and replacing with the                            'babyscope'. The patient tolerated the procedure. Scope In: 8:48:27 AM Scope Out: 9:01:42 AM Scope Withdrawal Time: 0 hours 8 minutes 45 seconds  Total Procedure Duration: 0 hours 13 minutes 15 seconds  Findings:      One benign-appearing, intrinsic severe (stenosis; an endoscope cannot       pass) stenosis was found 17 cm from the incisors. This stenosis measured       5 mm (inner diameter) x 1 cm (in length). The stenosis was traversed       after downsizing adult endoscope to the pediatric endoscope (5.6 mm       diameter). With gentle pressure, the region was traversed with mild       resistance. After the rest of the EGD was completed as below, a       guidewire was placed under fluoroscopic guidance and the scope was       withdrawn. Dilation was performed with a Savary dilator with mild       resistance at 7 mm. The dilation site was examined following endoscope       reinsertion and showed persistent moderate mucosal disruption, moderate       improvement in luminal narrowing and no  perforation at the site of the       UES stricture.      A medium-sized, fungating and ulcerating mass with bleeding and stigmata       of recent bleeding was found in the mid esophagus, 25 to 29 cm from the       incisors. The mass was partially obstructing and partially       circumferential (involving one-third of the lumen circumference).       Biopsies were taken with a cold forceps for histology.      White nummular lesions were noted in the entire esophagus.      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 Biopsies were taken with a cold forceps for histology to rule       out EoE/LoE and also Candida.      The Z-line was irregular and was found 39 cm from the incisors.      Patchy mildly erythematous mucosa without bleeding was found in the       entire examined stomach.      No other gross lesions were noted in the entire examined stomach.       Biopsies were taken with a cold forceps for histology and Helicobacter       pylori testing.      No gross lesions were noted in the duodenal bulb, in the first portion       of the duodenum and in the second portion of the duodenum. Biopsies were       taken with a cold forceps for histology. Impression:               - Benign-appearing esophageal stenosis. Dilated by                            the pediatric endoscope and subsequently Savary                            dilation. No evidence of perforation but mucosal  wrents present. Scope passed easier then prior to                            procedure beginning.                           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                            Biopsied.                           -  White nummular lesions in esophageal mucosa.                            Pseudodiverticulosis, as well as congested,                            longitudinally marked, scalloped, texture changed                            mucosa in the esophagus. Biopsied for EoE/LoE and                            Candida.                           - Z-line irregular, 39 cm from the incisors.                           - Erythematous mucosa in the stomach. No other                            gross lesions in the stomach. Biopsied.                           - No gross lesions in the duodenal bulb, in the                            first portion of the duodenum and in the second                            portion of the duodenum. Biopsied. Recommendation:           - Proceed to scheduled colonoscopy.                           - Initiate Omeprazole 40 mg twice daily. If unable                            to tolerate PO Pills, then will need to transition  to Omeprazole liquid 40 mg twice daily.                           - Await pathology results.                           - Observe patient's clinical course.                           - Clear liquids x 24 hours then may advance to full                            liquids for 24 hours. If tolerating then very                            well-chewed soft diet could be considered. Needs to                            have Protein shakes a part of her diet moving                            forward 2-3 per day.                           - Cepacol lozenges/Chloraseptic spray to back of                            throat for sore-throat.                           - Tentative plan for repeat EGD in hospital-based                            setting in 3-weeks (will await pathology findings                            before scheduling) for further dilation, if                            possible to 11/15/08 depending on how things look.                            - Will plan once biopsies return, if confirmatory                            of malignancy for CT-CAP to be performed.                           - EUS will not be possible due to the narrowing at                            the UES region to be performed, so likely PET-CT  may be necessary.                           - If nutrition remains an issue, will need to                            consider PEG-tube placement.                           - The findings and recommendations were discussed                            with the patient.                           - The findings and recommendations were discussed                            with the patient's family. Procedure Code(s):        --- Professional ---                           [phone], Esophagogastroduodenoscopy, flexible,                            transoral; with insertion of guide wire followed by                            passage of dilator(s) through esophagus over guide                            wire Diagnosis Code(s):        --- Professional ---                           K22.2, Esophageal obstruction                           D49.0, Neoplasm of unspecified behavior of                            digestive system                           K22.8, Other specified diseases of esophagus                           K31.89, Other diseases of stomach and duodenum                           R10.13, Epigastric pain                           R13.10, Dysphagia, unspecified                           R11.2, Nausea with vomiting, unspecified  R11.10, Vomiting, unspecified                           R63.4, Abnormal weight loss                           R93.3, Abnormal findings on diagnostic imaging of                            other parts of digestive tract CPT copyright 2019 [employer]. All rights reserved. The codes documented in this report are preliminary and  upon coder review may  be revised to meet current compliance requirements. Justice Britain, MD 01/14/2020 9:44:57 AM Number of Addenda: 0

## 2020-01-14 NOTE — Anesthesia Postprocedure Evaluation (Signed)
Anesthesia Post Note  Patient: Alexandria Buck  Procedure(s) Performed: COLONOSCOPY WITH PROPOFOL (N/A ) ESOPHAGOGASTRODUODENOSCOPY (EGD) WITH PROPOFOL (N/A ) ESOPHAGEAL DILATION BIOPSY     Patient location during evaluation: Endoscopy Anesthesia Type: MAC Level of consciousness: awake and alert Pain management: pain level controlled Vital Signs Assessment: post-procedure vital signs reviewed and stable Respiratory status: spontaneous breathing, nonlabored ventilation and respiratory function stable Cardiovascular status: stable and blood pressure returned to baseline Postop Assessment: no apparent nausea or vomiting Anesthetic complications: no   No complications documented.  Last Vitals:  Vitals:   01/14/20 0935 01/14/20 0945  BP: (!) 152/122 (!) 139/95  Pulse: 93 72  Resp: (!) 22 (!) 27  Temp:    SpO2: 100% 99%    Last Pain:  Vitals:   01/14/20 0945  TempSrc:   PainSc: 0-No pain                 Koal Eslinger,W. EDMOND

## 2020-01-14 NOTE — Anesthesia Procedure Notes (Signed)
Procedure Name: MAC Date/Time: 01/14/2020 7:57 AM Performed by: Lowella Dell, CRNA Pre-anesthesia Checklist: Patient identified, Emergency Drugs available, Suction available, Patient being monitored and Timeout performed Patient Re-evaluated:Patient Re-evaluated prior to induction Oxygen Delivery Method: Nasal cannula Placement Confirmation: positive ETCO2 Dental Injury: Teeth and Oropharynx as per pre-operative assessment

## 2020-01-14 NOTE — Interval H&P Note (Signed)
History and Physical Interval Note:  01/14/2020 7:33 AM  Alexandria Buck  has presented today for surgery, with the diagnosis of Dysphagia, Abnormal Barium Swallowing, Colon Cancer Screening.  The various methods of treatment have been discussed with the patient and family. After consideration of risks, benefits and other options for treatment, the patient has consented to  Procedure(s): COLONOSCOPY WITH PROPOFOL (N/A) ESOPHAGOGASTRODUODENOSCOPY (EGD) WITH PROPOFOL (N/A) as a surgical intervention.  The patient's history has been reviewed, patient examined, no change in status, stable for surgery.  I have reviewed the patient's chart and labs.  Questions were answered to the patient's satisfaction.    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  Will try to have fluoroscopy available.  [employer]

## 2020-01-15 ENCOUNTER — Other Ambulatory Visit: Payer: Self-pay | Admitting: Physician Assistant

## 2020-01-17 ENCOUNTER — Encounter (HOSPITAL_COMMUNITY): Payer: Self-pay | Admitting: Gastroenterology

## 2020-01-18 ENCOUNTER — Ambulatory Visit (HOSPITAL_BASED_OUTPATIENT_CLINIC_OR_DEPARTMENT_OTHER): Payer: Self-pay | Admitting: Family

## 2020-01-18 LAB — SURGICAL PATHOLOGY

## 2020-01-18 NOTE — Progress Notes (Signed)
Patient did not show for appointment.   

## 2020-01-19 ENCOUNTER — Other Ambulatory Visit: Payer: Self-pay

## 2020-01-19 DIAGNOSIS — R1319 Other dysphagia: Secondary | ICD-10-CM

## 2020-01-19 DIAGNOSIS — C159 Malignant neoplasm of esophagus, unspecified: Secondary | ICD-10-CM

## 2020-01-19 MED ORDER — AMOXICILLIN 500 MG PO TABS: 1000.0000 mg | ORAL_TABLET | Freq: Two times a day (BID) | ORAL | 0 refills | Status: AC

## 2020-01-19 MED ORDER — CLARITHROMYCIN 500 MG PO TABS: 500.0000 mg | ORAL_TABLET | Freq: Two times a day (BID) | ORAL | 0 refills | Status: AC

## 2020-01-19 MED ORDER — METRONIDAZOLE 500 MG PO TABS: 500.0000 mg | ORAL_TABLET | Freq: Four times a day (QID) | ORAL | 0 refills | Status: AC

## 2020-01-20 ENCOUNTER — Encounter: Payer: Self-pay | Admitting: Gastroenterology

## 2020-01-21 ENCOUNTER — Encounter: Payer: Self-pay | Admitting: General Practice

## 2020-01-21 ENCOUNTER — Encounter: Payer: Self-pay | Admitting: Radiation Oncology

## 2020-01-21 ENCOUNTER — Ambulatory Visit
Admission: RE | Admit: 2020-01-21 | Discharge: 2020-01-21 | Disposition: A | Discharge status: Home / Self Care | Payer: Self-pay | Source: Ambulatory Visit | Attending: Radiation Oncology | Admitting: Radiation Oncology

## 2020-01-21 ENCOUNTER — Other Ambulatory Visit: Payer: Self-pay

## 2020-01-21 VITALS — BP 112/76 | HR 78 | Temp 98.3°F | Resp 18 | Ht 61.0 in | Wt 93.2 lb

## 2020-01-21 DIAGNOSIS — C159 Malignant neoplasm of esophagus, unspecified: Secondary | ICD-10-CM

## 2020-01-21 HISTORY — DX: Malignant neoplasm of esophagus, unspecified: C15.9
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
## 2020-01-21 NOTE — Progress Notes (Addendum)
[facility] CSW Progress Notes  Request received from radiation oncologist to refer patient to Transportation Services for help w transport to upcoming appointments.  Referral sent w request they contact patient to enroll in program and coordinate transportation.  Called patient - she says she is already enrolled in and using [employer].  She does want help following up on her [place] application which has been at [place] for several months.  She was denied for disability.  These issues can be revisited based on new information on her medical situation.  She was advised to contact her DSS Medicaid worker to update them.  Edwyna Shell, LCSW Clinical Social Worker Phone:  [phone]

## 2020-01-21 NOTE — Patient Instructions (Signed)
Coronavirus (COVID-19) Are you at risk?  Are you at risk for the Coronavirus (COVID-19)?  To be considered HIGH RISK for Coronavirus (COVID-19), you have to meet the following criteria:  . Traveled to [place], [place], [place], [place] or [place]; or in the [place] to [place], [place], [place], or [place]; and have fever, cough, and shortness of breath within the last 2 weeks of travel OR . Been in close contact with a person diagnosed with COVID-19 within the last 2 weeks and have fever, cough, and shortness of breath . IF YOU DO NOT MEET THESE CRITERIA, YOU ARE CONSIDERED LOW RISK FOR COVID-19.  What to do if you are HIGH RISK for COVID-19?  . If you are having a medical emergency, call 911. . Seek medical care right away. Before you go to a doctor's office, urgent care or emergency department, call ahead and tell them about your recent travel, contact with someone diagnosed with COVID-19, and your symptoms. You should receive instructions from your physician's office regarding next steps of care.  . When you arrive at healthcare provider, tell the healthcare staff immediately you have returned from visiting [place], [place], [place], [place] or [place]; or traveled in the [place] to [place], [place], [place], or [place]; in the last two weeks or you have been in close contact with a person diagnosed with COVID-19 in the last 2 weeks.   . Tell the health care staff about your symptoms: fever, cough and shortness of breath. . After you have been seen by a medical provider, you will be either: o Tested for (COVID-19) and discharged home on quarantine except to seek medical care if symptoms worsen, and asked to  - Stay home and avoid contact with others until you get your results (4-5 days)  - Avoid travel on public transportation if possible (such as bus, train, or airplane) or o Sent to the Emergency Department by EMS for evaluation, COVID-19 testing, and possible  admission depending on your condition and test results.  What to do if you are LOW RISK for COVID-19?  Reduce your risk of any infection by using the same precautions used for avoiding the common cold or flu:  . Wash your hands often with soap and warm water for at least 20 seconds.  If soap and water are not readily available, use an alcohol-based hand sanitizer with at least 60% alcohol.  . If coughing or sneezing, cover your mouth and nose by coughing or sneezing into the elbow areas of your shirt or coat, into a tissue or into your sleeve (not your hands). . Avoid shaking hands with others and consider head nods or verbal greetings only. . Avoid touching your eyes, nose, or mouth with unwashed hands.  . Avoid close contact with people who are sick. . Avoid places or events with large numbers of people in one location, like concerts or sporting events. . Carefully consider travel plans you have or are making. . If you are planning any travel outside or inside the [place], visit the CDC's Travelers' Health webpage for the latest health notices. . If you have some symptoms but not all symptoms, continue to monitor at home and seek medical attention if your symptoms worsen. . If you are having a medical emergency, call 911.   ADDITIONAL HEALTHCARE OPTIONS FOR PATIENTS  [facility] / e-Visit: [email or web address]         [facility]: [phone]  [facility]: [phone]                   [facility]: [phone]   

## 2020-01-21 NOTE — Progress Notes (Signed)
Patient in for new diagnosis of esophageal carcinoma. She has significant cardiomyopathy and will be getting a pacemaker sometime next month per patient. She also will be rescheduled for an EGD for dilation. Patient states she is on 3 different antibiotics for H pylori. She also states she is having severe back pain times one month states her doctor said it was her infection that was causing the pain. No work up has been done on this. Pain is 10/10, she states she has no pain medication she is using bengay rub, states it does help.

## 2020-01-21 NOTE — Progress Notes (Signed)
I called the patient back and she has a PET on 01/25/20, and I gave her an appt for simulation on 01/26/20 the same day she sees Dr. Benay Spice. She will need help with transportation and I've copied the GI navigator and social work team who we would like to be involved in her care as she has multiple social issues. Her daughter Aniceto Boss who is also our patient is trying to help her navigate her care as well but is starting to rebuild a relationship with her mother from who she was estranged from for many years.

## 2020-01-22 NOTE — Progress Notes (Signed)
Spoke with patient introduced myself and explained my role as Art therapist.  She is aware of her upcoming appointments for PET scan on 10/18 and medical oncology consult with Dr. Benay Spice on 10/19 at 11:00 am.

## 2020-01-25 ENCOUNTER — Telehealth: Payer: Self-pay | Admitting: Nutrition

## 2020-01-25 ENCOUNTER — Ambulatory Visit (HOSPITAL_COMMUNITY)
Admission: RE | Admit: 2020-01-25 | Discharge: 2020-01-25 | Disposition: A | Discharge status: Home / Self Care | Payer: Self-pay | Source: Ambulatory Visit | Attending: Radiation Oncology | Admitting: Radiation Oncology

## 2020-01-25 ENCOUNTER — Other Ambulatory Visit: Payer: Self-pay

## 2020-01-25 DIAGNOSIS — C159 Malignant neoplasm of esophagus, unspecified: Secondary | ICD-10-CM | POA: Insufficient documentation

## 2020-01-25 LAB — GLUCOSE, CAPILLARY: Glucose-Capillary: 78 mg/dL (ref 70–99)

## 2020-01-25 MED ORDER — FLUDEOXYGLUCOSE F - 18 (FDG) INJECTION
5.1500 | Freq: Once | INTRAVENOUS | Status: AC
  Administered 2020-01-25: 5.15 via INTRAVENOUS

## 2020-01-25 NOTE — Telephone Encounter (Signed)
Rescheduled appt with nutrition on 10/19 to 10/26. Pamala Hurry will be out of office. Pt is aware of appt time and date.

## 2020-01-26 ENCOUNTER — Encounter: Payer: Self-pay | Admitting: Nutrition

## 2020-01-26 ENCOUNTER — Inpatient Hospital Stay: Payer: Self-pay | Attending: Oncology | Admitting: Oncology

## 2020-01-26 ENCOUNTER — Ambulatory Visit
Admission: RE | Admit: 2020-01-26 | Discharge: 2020-01-26 | Disposition: A | Discharge status: Home / Self Care | Payer: Self-pay | Source: Ambulatory Visit | Attending: Radiation Oncology | Admitting: Radiation Oncology

## 2020-01-26 ENCOUNTER — Other Ambulatory Visit: Payer: Self-pay

## 2020-01-26 VITALS — BP 121/91 | HR 60 | Temp 97.8°F | Resp 18 | Ht 61.0 in | Wt 95.9 lb

## 2020-01-26 DIAGNOSIS — Z87891 Personal history of nicotine dependence: Secondary | ICD-10-CM

## 2020-01-26 DIAGNOSIS — C154 Malignant neoplasm of middle third of esophagus: Secondary | ICD-10-CM | POA: Insufficient documentation

## 2020-01-26 DIAGNOSIS — I429 Cardiomyopathy, unspecified: Secondary | ICD-10-CM

## 2020-01-26 DIAGNOSIS — Z800 Family history of malignant neoplasm of digestive organs: Secondary | ICD-10-CM

## 2020-01-26 DIAGNOSIS — Z7289 Other problems related to lifestyle: Secondary | ICD-10-CM

## 2020-01-26 DIAGNOSIS — F319 Bipolar disorder, unspecified: Secondary | ICD-10-CM

## 2020-01-26 DIAGNOSIS — I10 Essential (primary) hypertension: Secondary | ICD-10-CM
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
## 2020-01-26 NOTE — Progress Notes (Signed)
START ON PATHWAY REGIMEN - Gastroesophageal ? ? ?  Administer weekly during RT: ?    Paclitaxel  ?    Carboplatin  ? ?**Always confirm dose/schedule in your pharmacy ordering system** ? ?Patient Characteristics: ?Esophageal & GE Junction, Squamous Cell, Preoperative or Nonsurgical Candidate (Clinical Staging), cT2 or Higher or cN+, Surgical Candidate (Up to cT4a) - Preoperative Therapy ?Histology: Squamous Cell ?Disease Classification: Esophageal ?Therapeutic Status: Preoperative or Nonsurgical Candidate (Clinical Staging) ?AJCC M Category: Staged < 8th Ed. ?AJCC 8 Stage Grouping: Staged < 8th Ed. ?AJCC Grade: Staged < 8th Ed. ?AJCC Location: Staged < 8th Ed. ?AJCC T Category: Staged < 8th Ed. ?AJCC N Category: Staged < 8th Ed. ?Intent of Therapy: ?Curative Intent, Discussed with Patient ?

## 2020-01-26 NOTE — Progress Notes (Signed)
Met with patient today at initial medical oncology consult with Dr. Julieanne Manson.  I introduced myself and explained my role as nurse navigator.  She was given my card with my direct contact number and encouraged to call with any questions or concerns.

## 2020-01-27 ENCOUNTER — Other Ambulatory Visit: Payer: Self-pay

## 2020-01-27 ENCOUNTER — Other Ambulatory Visit: Payer: Self-pay | Admitting: *Deleted

## 2020-01-27 ENCOUNTER — Telehealth: Payer: Self-pay | Admitting: Oncology

## 2020-01-27 MED ORDER — DEXAMETHASONE 2 MG PO TABS: 10.0000 mg | ORAL_TABLET | ORAL | 0 refills | Status: DC

## 2020-01-27 MED ORDER — PROCHLORPERAZINE MALEATE 5 MG PO TABS: 5.0000 mg | ORAL_TABLET | Freq: Four times a day (QID) | ORAL | 1 refills | Status: DC | PRN

## 2020-01-27 MED FILL — DEXAMETHASONE 2 MG TABLET: 2 | 2 days supply | Qty: 10 | Fill #0

## 2020-01-27 NOTE — Progress Notes (Signed)
Anti-emetic and dexamethasone premeds sent to pharmacy per Dr. Benay Spice request.

## 2020-01-27 NOTE — Telephone Encounter (Signed)
Scheduled appointments per 10/19 los. Spoke to patient who is aware of appointments dates and times. Will have an updated calendar printed for patient at next visit.

## 2020-01-27 NOTE — Progress Notes (Signed)
Requested PDL1 testing be added to specimen [id number] per Dr. Benay Spice.

## 2020-01-28 ENCOUNTER — Telehealth: Payer: Self-pay | Admitting: General Practice

## 2020-01-28 ENCOUNTER — Ambulatory Visit (HOSPITAL_COMMUNITY): Payer: Self-pay

## 2020-01-28 MED FILL — PROCHLORPERAZINE 5 MG TAB: 5 | 15 days supply | Qty: 60 | Fill #0

## 2020-01-28 NOTE — Telephone Encounter (Signed)
[facility] CSW Progress Notes  Call to patient to check in with her - no answer, left VM w my contact information and request for return call.  Per record, she was to contact her DSS Medicaid worker to get progress report on her application for Medicaid.    Edwyna Shell, LCSW Clinical Social Worker Phone:  [phone]

## 2020-01-29 ENCOUNTER — Telehealth: Payer: Self-pay | Admitting: Gastroenterology

## 2020-01-29 NOTE — Telephone Encounter (Signed)
Pt's daughter is requesting a call back from a nurse to discuss possibly changing her pills to a liquid form bc pt is unable to take her medication for H Pylori.

## 2020-01-29 NOTE — Telephone Encounter (Signed)
This needs to go to the nurse. I did not send any medications for this pt.

## 2020-02-01 ENCOUNTER — Telehealth: Payer: Self-pay

## 2020-02-01 ENCOUNTER — Ambulatory Visit: Payer: Self-pay

## 2020-02-01 NOTE — Telephone Encounter (Signed)
The pt states that she did go for her appt as scheduled.  She described the procedure and states she did not miss her appt.  She also states she was told the IV had contrast and she had to lay very still.  She says it took 40 min to complete.  I spoke with radiology and they believe that she is confused about the CT chest abd and pelvis (chest was ordered see below) due to a CT simulation after her PET scan.  I have gotten this rescheduled for 02/09/20 3 pm at [facility].  The pt has been advised.    CT CHEST W CONTRAST (Order [id number]) Imaging Date: 01/19/2020 Department: [facility] Ordering/Authorizing: Mansouraty, Telford Nab., MD   CT ABDOMEN PELVIS W CONTRAST (Order [id number]) Imaging Date: 01/19/2020 Department: [facility] Ordering/Authorizing: Mansouraty, Telford Nab., MD

## 2020-02-01 NOTE — Telephone Encounter (Signed)
-----   Message from Ned Clines sent at 02/01/2020 11:59 AM EDT ----- Patient no showed for CT ab/pelb & it does not look like the CT Chest was included with this.  Can you check on this?  We need these test done before the patients appointment with our office.  Thank you  cindy ----- Message ----- From: Ned Clines Sent: 01/22/2020   9:52 AM EDT To: Timothy Lasso, RN  Can you get the CT Chest scheduled so that we can get appointment scheduled with surgeon at our office?  The other two test that surgeon needs have been scheduled. Thanks  Jenny Reichmann ----- Message ----- From: Laury Deep, RN Sent: 01/21/2020  11:16 AM EDT To: Koren Bound,   EBG said she needs an EUS, CT chest and PET.  I see the PET is scheduled but others are not.  Wait until all of that is and he said he will see her or HL can.  Either way.  Thanks, Ryan ----- Message ----- From: Ned Clines Sent: 01/21/2020  10:44 AM EDT To: Laury Deep, RN  Malignant neoplasm esophagus, upper endo 10/7, cta neck 3/19 ct ab/pelv 10/21, (order in or PET & CT CHEST not scheduled yet), ref: mansouraty, when & where do you want me to add this? Do you want me to wait until the PET is scheduled?  Thanks  cindy

## 2020-02-02 ENCOUNTER — Other Ambulatory Visit: Payer: Self-pay

## 2020-02-02 ENCOUNTER — Inpatient Hospital Stay: Payer: Self-pay

## 2020-02-02 ENCOUNTER — Encounter: Payer: Self-pay | Admitting: Oncology

## 2020-02-02 ENCOUNTER — Ambulatory Visit: Payer: Self-pay

## 2020-02-02 ENCOUNTER — Institutional Professional Consult (permissible substitution): Payer: Self-pay | Admitting: Internal Medicine

## 2020-02-02 ENCOUNTER — Inpatient Hospital Stay: Payer: Self-pay | Admitting: Nutrition

## 2020-02-02 NOTE — Progress Notes (Signed)
Called pt to introduce myself as her Arboriculturist.  Pt is uninsured so copay assistance isn't available for her.  Pt has been referred to [facility] thru the hospital side.  I informed her of the [employer], went over what it covers and gave her the income requirement.  Pt would like to apply so she will bring proof of income on 02/10/20.  If approved I will give her an expense sheet and my card for any questions or concerns she may have in the future.

## 2020-02-02 NOTE — Progress Notes (Signed)
59 year old female diagnosed with Esophageal cancer followed by Dr.Sherrill.  PMH includes HTN, Hep C, Depression, Bipolar, Alcohol, CHF.  Medications include lasix, prilosec, compazine, Decadron  Labs include Na 132 on Oct 1.  Height: 61 inches Weight: 95.9 pounds Oct 19 UBW: 100 pounds   BMI: 18.12  Patient reports good appetite and good oral intake. She has a history of solid food dysphagia but says it is better now because the doctor "stretched her esophagus" Complains about some pills being too large to swallow. She cannot tell me which one is too big. Eats Grits, eggs, bacon, cereal for breakfast. Drinks Ensure TID which is purchased by daughter. Patient needs redirection often and is very concerned about her pills.  Estimated Nutrition Needs: 1500-1700 kcal, 50-65 gm protein, 1.6 L fluid  Nutrition Diagnosis: Unintended wt loss related to dysphagia as evidenced by 4% wt loss from UBW.  Intervention: Educated to add sauces and gravies to foods. Encouraged patient to chew food well and take small bites. Encouraged continue Ensure TID between meals. Contact MD regarding pills to large to swallow Will forward note to nurse navigator to keep her informed. Questions answered and teach back method used.  Monitoring, Evaluation, Goals: Patient will tolerate adequate calories and protein to minimize loss of lean body mass  Next Visit: Patient will call me with questions. Follow up based on plan of care.

## 2020-02-02 NOTE — Progress Notes (Signed)
Patient calls stating that the pills the GI doctor gave her for her infection are too big for her to take.  Reports that last Thursday she had taken them and even after crushing them she vomited blood. She called the ambulance but was not taken in for evaluation.  I explained she needed to reach out to Dr. Donneta Romberg office to see if they can prescribe something that she can take.  She states she has but has not heard back.  I told her I would message Dr. Donneta Romberg nurse with this information and have them reach out to her. Message sent to [facility].

## 2020-02-03 ENCOUNTER — Telehealth: Payer: Self-pay

## 2020-02-03 ENCOUNTER — Ambulatory Visit: Payer: Self-pay

## 2020-02-03 ENCOUNTER — Telehealth: Payer: Self-pay | Admitting: Gastroenterology

## 2020-02-03 MED ORDER — OMEPRAZOLE 2 MG/ML ORAL SUSPENSION: 20.0000 mg | Freq: Two times a day (BID) | ORAL | 0 refills | Status: DC

## 2020-02-03 MED ORDER — OMEPRAZOLE 20 MG PO CPDR: 20.0000 mg | DELAYED_RELEASE_CAPSULE | Freq: Two times a day (BID) | ORAL | 3 refills | Status: DC

## 2020-02-03 MED FILL — ?OMEPRAZOLE 20 MG CPDR: 20 | 30 days supply | Qty: 60 | Fill #0

## 2020-02-03 NOTE — Telephone Encounter (Signed)
Dr Rush Landmark please advise on the change to liquid H pylori treatment.

## 2020-02-03 NOTE — Telephone Encounter (Signed)
Patient called to follow up on the liquid omeprazole medication and stated that if it gets changed to capsules they need to be really small please call her with decision

## 2020-02-03 NOTE — Telephone Encounter (Signed)
The pt has been advised that liquid omeprazole has been sent to [facility].  She will call with any further concerns. She asked that I call her daughter and make her aware as well.  I have called her and left a message with this information.

## 2020-02-03 NOTE — Telephone Encounter (Signed)
Please transition to liquid Omeprazole twice daily.  Thank you. GM

## 2020-02-03 NOTE — Telephone Encounter (Signed)
The prescription has been sent again as capsule and the pt will try to open the capsule and mix in apple sauce.  She will call back with any further concerns .

## 2020-02-03 NOTE — Telephone Encounter (Signed)
-----   Message from Jonnie Finner, RN sent at 02/02/2020  4:05 PM EDT ----- Regarding: Patient Status Alexandria Buck,  I just got off the phone with this patient.  Diagnosed with esophageal cancer which I know you know.  She cannot swallow the antibiotics or the omeprazole prescribed by Dr. Rush Landmark.  She states that last week she had crushed the pills and they still got stuck causing her to vomit blood.  She called the ambulance but was not taken in for evaluation.  There is no note in the chart.  Are you able to reach out to her?  Her number is [phone].  It sounds like at this point they need to be in liquid form or very small pills.  Thanks for your help.  [name]

## 2020-02-04 ENCOUNTER — Ambulatory Visit: Payer: Self-pay

## 2020-02-04 ENCOUNTER — Other Ambulatory Visit (HOSPITAL_COMMUNITY)
Admission: RE | Admit: 2020-02-04 | Discharge: 2020-02-04 | Disposition: A | Discharge status: Home / Self Care | Payer: HRSA Program | Source: Ambulatory Visit | Attending: Gastroenterology | Admitting: Gastroenterology

## 2020-02-04 DIAGNOSIS — Z01812 Encounter for preprocedural laboratory examination: Secondary | ICD-10-CM | POA: Diagnosis present

## 2020-02-04 DIAGNOSIS — Z20822 Contact with and (suspected) exposure to covid-19: Secondary | ICD-10-CM | POA: Diagnosis not present

## 2020-02-04 LAB — SARS CORONAVIRUS 2 (TAT 6-24 HRS): SARS Coronavirus 2: NEGATIVE

## 2020-02-04 NOTE — Progress Notes (Signed)
Pharmacist Chemotherapy Monitoring - Initial Assessment    Anticipated start date: 02/10/2020   Regimen:  . Are orders appropriate based on the patient's diagnosis, regimen, and cycle? Yes . Does the plan date match the patient's scheduled date? Yes . Is the sequencing of drugs appropriate? Yes . Are the premedications appropriate for the patient's regimen? Yes . Prior Authorization for treatment is: Not Started o If applicable, is the correct biosimilar selected based on the patient's insurance? not applicable  Organ Function and Labs: Marland Kitchen Are dose adjustments needed based on the patient's renal function, hepatic function, or hematologic function? Yes . Are appropriate labs ordered prior to the start of patient's treatment? Yes . Other organ system assessment, if indicated: N/A . The following baseline labs, if indicated, have been ordered: N/A  Dose Assessment: . Are the drug doses appropriate? Yes . Are the following correct: o Drug concentrations Yes o IV fluid compatible with drug Yes o Administration routes Yes o Timing of therapy Yes . If applicable, does the patient have documented access for treatment and/or plans for port-a-cath placement? not applicable . If applicable, have lifetime cumulative doses been properly documented and assessed? yes Lifetime Dose Tracking  No doses have been documented on this patient for the following tracked chemicals: Doxorubicin, Epirubicin, Idarubicin, Daunorubicin, Mitoxantrone, Bleomycin, Oxaliplatin, Carboplatin, Liposomal Doxorubicin  o   Toxicity Monitoring/Prevention: . The patient has the following take home antiemetics prescribed: Prochlorperazine and Dexamethasone . The patient has the following take home medications prescribed: N/A . Medication allergies and previous infusion related reactions, if applicable, have been reviewed and addressed. No . The patient's current medication list has been assessed for drug-drug interactions  with their chemotherapy regimen. no significant drug-drug interactions were identified on review.  Order Review: . Are the treatment plan orders signed? No . Is the patient scheduled to see a provider prior to their treatment? Yes  I verify that I have reviewed each item in the above checklist and answered each question accordingly.  Loyd Salvador D 02/04/2020 4:12 PM

## 2020-02-05 ENCOUNTER — Other Ambulatory Visit: Payer: Self-pay | Admitting: Oncology

## 2020-02-05 ENCOUNTER — Ambulatory Visit: Payer: Self-pay

## 2020-02-05 NOTE — Progress Notes (Signed)
Preop call complete with patient.  Patient states she has received instructions and will be NPO at [place].  She states Dr. Donneta Romberg office arranged transportation to and from [facility].  She used this service previously and did not need a friend or family member to accompany her.

## 2020-02-08 ENCOUNTER — Ambulatory Visit (HOSPITAL_COMMUNITY): Payer: Self-pay | Admitting: Anesthesiology

## 2020-02-08 ENCOUNTER — Other Ambulatory Visit: Payer: Self-pay

## 2020-02-08 ENCOUNTER — Ambulatory Visit (HOSPITAL_COMMUNITY)
Admission: RE | Admit: 2020-02-08 | Discharge: 2020-02-08 | Disposition: A | Discharge status: Home / Self Care | Payer: Self-pay | Attending: Gastroenterology | Admitting: Gastroenterology

## 2020-02-08 ENCOUNTER — Encounter (HOSPITAL_COMMUNITY)
Admission: RE | Disposition: A | Discharge status: Home / Self Care | Payer: Self-pay | Source: Home / Self Care | Attending: Gastroenterology

## 2020-02-08 ENCOUNTER — Ambulatory Visit: Payer: Self-pay

## 2020-02-08 ENCOUNTER — Encounter (HOSPITAL_COMMUNITY): Payer: Self-pay | Admitting: Gastroenterology

## 2020-02-08 ENCOUNTER — Telehealth: Payer: Self-pay | Admitting: Oncology

## 2020-02-08 ENCOUNTER — Telehealth: Payer: Self-pay | Admitting: Gastroenterology

## 2020-02-08 DIAGNOSIS — C154 Malignant neoplasm of middle third of esophagus: Secondary | ICD-10-CM | POA: Insufficient documentation

## 2020-02-08 DIAGNOSIS — K2950 Unspecified chronic gastritis without bleeding: Secondary | ICD-10-CM | POA: Insufficient documentation

## 2020-02-08 DIAGNOSIS — C159 Malignant neoplasm of esophagus, unspecified: Secondary | ICD-10-CM

## 2020-02-08 DIAGNOSIS — Z800 Family history of malignant neoplasm of digestive organs: Secondary | ICD-10-CM | POA: Insufficient documentation

## 2020-02-08 DIAGNOSIS — R1310 Dysphagia, unspecified: Secondary | ICD-10-CM

## 2020-02-08 DIAGNOSIS — K222 Esophageal obstruction: Secondary | ICD-10-CM | POA: Insufficient documentation

## 2020-02-08 DIAGNOSIS — K3189 Other diseases of stomach and duodenum: Secondary | ICD-10-CM | POA: Insufficient documentation

## 2020-02-08 DIAGNOSIS — R1319 Other dysphagia: Secondary | ICD-10-CM

## 2020-02-08 DIAGNOSIS — K2289 Other specified disease of esophagus: Secondary | ICD-10-CM | POA: Insufficient documentation

## 2020-02-08 DIAGNOSIS — Z87891 Personal history of nicotine dependence: Secondary | ICD-10-CM | POA: Insufficient documentation

## 2020-02-08 HISTORY — PX: ESOPHAGOGASTRODUODENOSCOPY (EGD) WITH PROPOFOL: SHX5813

## 2020-02-08 HISTORY — PX: ESOPHAGEAL DILATION: SHX303

## 2020-02-08 HISTORY — PX: BIOPSY: SHX5522

## 2020-02-08 SURGERY — ESOPHAGOGASTRODUODENOSCOPY (EGD) WITH PROPOFOL
Anesthesia: Monitor Anesthesia Care

## 2020-02-08 MED ORDER — PROPOFOL 500 MG/50ML IV EMUL
INTRAVENOUS | Status: DC | PRN
  Administered 2020-02-08: 150 ug/kg/min via INTRAVENOUS

## 2020-02-08 MED ORDER — SODIUM CHLORIDE 0.9 % IV SOLN: INTRAVENOUS | Status: DC

## 2020-02-08 MED ORDER — PROPOFOL 10 MG/ML IV BOLUS
INTRAVENOUS | Status: DC | PRN
  Administered 2020-02-08 (×2): 30 mg via INTRAVENOUS

## 2020-02-08 MED ORDER — LIDOCAINE 2% (20 MG/ML) 5 ML SYRINGE
INTRAMUSCULAR | Status: DC | PRN
  Administered 2020-02-08: 40 mg via INTRAVENOUS

## 2020-02-08 MED ORDER — LACTATED RINGERS IV SOLN: INTRAVENOUS | Status: DC

## 2020-02-08 MED ORDER — OMEPRAZOLE 2 MG/ML ORAL SUSPENSION: 40.0000 mg | Freq: Two times a day (BID) | ORAL | 6 refills | Status: DC

## 2020-02-08 MED ORDER — ONDANSETRON HCL 4 MG/2ML IJ SOLN
INTRAMUSCULAR | Status: DC | PRN
  Administered 2020-02-08: 4 mg via INTRAVENOUS

## 2020-02-08 SURGICAL SUPPLY — 14 items

## 2020-02-08 NOTE — Anesthesia Preprocedure Evaluation (Addendum)
Anesthesia Evaluation  Patient identified by MRN, date of birth, ID band Patient awake    Reviewed: Allergy & Precautions, H&P , NPO status , Patient's Chart, lab work & pertinent test results  Airway Mallampati: II  TM Distance: >3 FB Neck ROM: Full    Dental no notable dental hx. (+) Edentulous Upper, Edentulous Lower, Dental Advisory Given   Pulmonary neg pulmonary ROS, former smoker,    Pulmonary exam normal breath sounds clear to auscultation       Cardiovascular Exercise Tolerance: Good hypertension, Pt. on medications and Pt. on home beta blockers +CHF   Rhythm:Regular Rate:Normal     Neuro/Psych PSYCHIATRIC DISORDERS Depression Bipolar Disorder negative neurological ROS     GI/Hepatic negative GI ROS, (+) Hepatitis -, C  Endo/Other  negative endocrine ROS  Renal/GU negative Renal ROS  negative genitourinary   Musculoskeletal   Abdominal   Peds  Hematology negative hematology ROS (+)   Anesthesia Other Findings   Reproductive/Obstetrics negative OB ROS                            Anesthesia Physical  Anesthesia Plan  ASA: IV  Anesthesia Plan: MAC   Post-op Pain Management:    Induction: Intravenous  PONV Risk Score and Plan: 2 and Midazolam and Ondansetron  Airway Management Planned: Nasal Cannula  Additional Equipment:   Intra-op Plan:   Post-operative Plan:   Informed Consent: I have reviewed the patients History and Physical, chart, labs and discussed the procedure including the risks, benefits and alternatives for the proposed anesthesia with the patient or authorized representative who has indicated his/her understanding and acceptance.     Dental advisory given  Plan Discussed with: CRNA  Anesthesia Plan Comments: ( She was referred to EP for evaluation of ICD placement.  Seen by Dr. Caryl Comes 01/11/2020.  Per note she remains limited in activity, shortness of  breath at less than 100 yards.  No orthopnea or nocturnal dyspnea.  No edema.  She does also report frequent palpitations.  ICD was discussed, and patient did elect to proceed.  Event monitor was ordered to characterize palpitations, this has not been completed yet.  History hepatitis C, has been seen by infectious disease and Harvoni was planned but patient did not pick up medication.  BMP and CBC from 01/08/2020 reviewed, sodium mildly low at 132, otherwise unremarkable.  We will need day of surgery evaluation.  EKG 01/11/2020: Sinus bradycardia.  Rate 58.  TTE 12/24/2019: 1. Left ventricular ejection fraction, by estimation, is 20 to 25%. The  left ventricle has severely decreased function. The left ventricle  demonstrates global hypokinesis. Left ventricular diastolic parameters are  consistent with Grade I diastolic  dysfunction (impaired relaxation).  2. Right ventricular systolic function is mildly reduced. The right  ventricular size is normal. Tricuspid regurgitation signal is inadequate  for assessing PA pressure.  3. Left atrial size was mildly dilated.  4. The mitral valve is normal in structure. Moderate central mitral valve  regurgitation, likely functional. No evidence of mitral stenosis.  5. The aortic valve is tricuspid. Aortic valve regurgitation is not  visualized. No aortic stenosis is present.  6. The inferior vena cava is normal in size with greater than 50%  respiratory variability, suggesting right atrial pressure of 3 mmHg.  7. Consider referral to CHF clinic.   Left/right heart cath 06/25/2019: 1. No angiographic evidence of CAD 2. Elevated right heart pressures (RA 14, RV  54/8/14, PA 57/25/37, PCWP 26).  )        Anesthesia Quick Evaluation
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
## 2020-02-08 NOTE — Transfer of Care (Signed)
Immediate Anesthesia Transfer of Care Note  Patient: Alexandria Buck  Procedure(s) Performed: ESOPHAGOGASTRODUODENOSCOPY (EGD) WITH PROPOFOL (N/A ) BIOPSY ESOPHAGEAL DILATION  Patient Location: Endoscopy Unit  Anesthesia Type:MAC  Level of Consciousness: awake, alert  and oriented  Airway & Oxygen Therapy: Patient Spontanous Breathing  Post-op Assessment: Report given to RN and Post -op Vital signs reviewed and stable  Post vital signs: Reviewed and stable  Last Vitals:  Vitals Value Taken Time  BP 132/85 02/08/20 1550  Temp    Pulse 88 02/08/20 1551  Resp 17 02/08/20 1551  SpO2 95 % 02/08/20 1551  Vitals shown include unvalidated device data.  Last Pain:  Vitals:   02/08/20 1245  TempSrc: Oral  PainSc: 10-Worst pain ever         Complications: No complications documented.

## 2020-02-08 NOTE — Discharge Instructions (Signed)
YOU HAD AN ENDOSCOPIC PROCEDURE TODAY: Refer to the procedure report and other information in the discharge instructions given to you for any specific questions about what was found during the examination. If this information does not answer your questions, please call [place] office at [phone] to clarify.  ° °YOU SHOULD EXPECT: Some feelings of bloating in the abdomen. Passage of more gas than usual. Walking can help get rid of the air that was put into your GI tract during the procedure and reduce the bloating. If you had a lower endoscopy (such as a colonoscopy or flexible sigmoidoscopy) you may notice spotting of blood in your stool or on the toilet paper. Some abdominal soreness may be present for a day or two, also. ° °DIET: Your first meal following the procedure should be a light meal and then it is ok to progress to your normal diet. A half-sandwich or bowl of soup is an example of a good first meal. Heavy or fried foods are harder to digest and may make you feel nauseous or bloated. Drink plenty of fluids but you should avoid alcoholic beverages for 24 hours. If you had a esophageal dilation, please see attached instructions for diet.   ° °ACTIVITY: Your care partner should take you home directly after the procedure. You should plan to take it easy, moving slowly for the rest of the day. You can resume normal activity the day after the procedure however YOU SHOULD NOT DRIVE, use power tools, machinery or perform tasks that involve climbing or major physical exertion for 24 hours (because of the sedation medicines used during the test).  ° °SYMPTOMS TO REPORT IMMEDIATELY: °A gastroenterologist can be reached at any hour. Please call [phone]  for any of the following symptoms:  °Following lower endoscopy (colonoscopy, flexible sigmoidoscopy) °Excessive amounts of blood in the stool  °Significant tenderness, worsening of abdominal pains  °Swelling of the abdomen that is new, acute  °Fever of 100° or  higher  °Following upper endoscopy (EGD, EUS, ERCP, esophageal dilation) °Vomiting of blood or coffee ground material  °New, significant abdominal pain  °New, significant chest pain or pain under the shoulder blades  °Painful or persistently difficult swallowing  °New shortness of breath  °Black, tarry-looking or red, bloody stools ° °FOLLOW UP:  °If any biopsies were taken you will be contacted by phone or by letter within the next 1-3 weeks. Call [phone]  if you have not heard about the biopsies in 3 weeks.  °Please also call with any specific questions about appointments or follow up tests. ° °

## 2020-02-08 NOTE — Telephone Encounter (Signed)
Scheduled appt per 10/29 sch msg - unable to reach patient. Left message for patient with apt date and time

## 2020-02-08 NOTE — Anesthesia Procedure Notes (Signed)
Procedure Name: MAC Date/Time: 02/08/2020 3:18 PM Performed by: Griffin Dakin, CRNA Pre-anesthesia Checklist: Patient identified, Emergency Drugs available, Suction available and Patient being monitored Patient Re-evaluated:Patient Re-evaluated prior to induction Oxygen Delivery Method: Simple face mask Induction Type: IV induction Number of attempts: 1 Placement Confirmation: positive ETCO2 and breath sounds checked- equal and bilateral Dental Injury: Teeth and Oropharynx as per pre-operative assessment

## 2020-02-08 NOTE — Op Note (Addendum)
[facility] Patient Name: Alexandria Buck Procedure Date : 02/08/2020 MRN: [id number] Attending MD: Justice Britain , MD Date of Birth: 1961-02-28 CSN: [id number] Age: 59 Admit Type: Outpatient Procedure:                Upper GI endoscopy Indications:              Dysphagia, Malignant esophageal squamous cell                            carcinoma, Stenosis of the esophagus, Further                            biopsies of the esophagus - on random esophagus                            biopsies there was concern for foci of cancer (felt                            that this was likely contamination from known                            middle esophagal cancer) Providers:                Justice Britain, MD, Jeanella Cara, RN,                            Cletis Athens, Technician Referring MD:             Jodelle Gross, Izola Price. [place], PA Veaz[place],                            Ladell Pier Medicines:                Monitored Anesthesia Care Complications:            No immediate complications. Estimated Blood Loss:     Estimated blood loss was minimal. Procedure:                Pre-Anesthesia Assessment:                           - Prior to the procedure, a History and Physical                            was performed, and patient medications and                            allergies were reviewed. The patient's tolerance of                            previous anesthesia was also reviewed. The risks                            and benefits of the procedure and the sedation  options and risks were discussed with the patient.                            All questions were answered, and informed consent                            was obtained. Prior Anticoagulants: The patient has                            taken no previous anticoagulant or antiplatelet                            agents. ASA Grade Assessment: III - A patient with                             severe systemic disease. After reviewing the risks                            and benefits, the patient was deemed in                            satisfactory condition to undergo the procedure.                           After obtaining informed consent, the Baby                            endoscope was passed under direct vision.                            Throughout the procedure, the patient's blood                            pressure, pulse, and oxygen saturations were                            monitored continuously. The [id number]-[id number] ([id number])                            Olympus slim Endoscope was introduced through the                            mouth, and advanced to the second part of duodenum.                            The upper GI endoscopy was accomplished without                            difficulty. The patient tolerated the procedure. Scope In: Scope Out: Findings:      A few benign-appearing, intrinsic moderate (circumferential scarring or       stenosis; an endoscope may pass) stenoses were found in the proximal       esophagus and middle esophagus. The previous UES narrowing was improved.  The narrowest stenosis measured 8 mm (inner diameter). The stenoses were       traversed. After the completion of the rest of the EGD a guidewire was       placed and the scope was withdrawn. Dilation was performed with a Savary       dilator with no resistance at 7 mm, 8 mm and 9 mm, mild resistance at 10       mm and moderate resistance at 11 mm. The dilation site was examined       following endoscope reinsertion and showed moderate mucosal disruption,       moderate improvement in luminal narrowing and no perforation.      A medium-sized, fungating and ulcerating mass with bleeding and stigmata       of recent bleeding was found in the mid esophagus, 25 to 29 cm from the       incisors. The mass was non-obstructing and partially circumferential       (involving  one-third of the lumen circumference).      A few white nummular lesions were noted in the mid esophagus and in the       distal esophagus. Nothing concerning for further areas of true cancer       were seen. Random biopsies were taken with a cold forceps for histology       throughout the esophagus.      Patchy mildly erythematous mucosa without bleeding was found in the       entire examined stomach - improved from prior. Biopsies were taken with       a cold forceps for histology and Helicobacter pylori testing (not clear       she tolerated the antibiotics completely).      No gross lesions were noted in the duodenal bulb, in the first portion       of the duodenum and in the second portion of the duodenum. Impression:               - Benign-appearing esophageal stenoses - Improved                            from prior. Dilated with appropriate mucosal                            wrenting.                           - Malignant esophageal tumor was found in the mid                            esophagus - previously noted.                           - A few white nummular lesions in esophageal                            mucosa. Biopsied.                           - Erythematous mucosa in the stomach - due to  history of H. pylori, biopsied.                           - No gross lesions in the duodenal bulb, in the                            first portion of the duodenum and in the second                            portion of the duodenum. Recommendation:           - The patient will be observed post-procedure,                            until all discharge criteria are met.                           - Liquid diet for 36 hours and then may advance to                            soft diet thereafter as she has been doing.                           - Increase Liquid Omeprazole to 40 mg twice daily                            (New Rx sent) please call us if any issues  arise                            with obtaining this.                           - Please use Cepacol or Halls Lozenges +/-                            Chloraseptic spray for next 72-96 hours to aid in                            sore thoat should you experience this.                           - Observe patient's clinical course.                           - Await pathology results.                           - Repeat Dilation in 4 weeks for retreatment if                            necessary, though she will begin her                            chemotherapy/radiation soon.                           -  The findings and recommendations were discussed                            with the patient.                           - The findings and recommendations were discussed                            with the patient's family. Procedure Code(s):        --- Professional ---                           [phone], Esophagogastroduodenoscopy, flexible,                            transoral; with insertion of guide wire followed by                            passage of dilator(s) through esophagus over guide                            wire Diagnosis Code(s):        --- Professional ---                           K22.2, Esophageal obstruction                           C15.4, Malignant neoplasm of middle third of                            esophagus                           K22.8, Other specified diseases of esophagus                           K31.89, Other diseases of stomach and duodenum                           R13.10, Dysphagia, unspecified CPT copyright 2019 [employer]. All rights reserved. The codes documented in this report are preliminary and upon coder review may  be revised to meet current compliance requirements. Justice Britain, MD 02/08/2020 4:11:42 PM Number of Addenda: 0

## 2020-02-09 ENCOUNTER — Other Ambulatory Visit: Payer: Self-pay

## 2020-02-09 ENCOUNTER — Ambulatory Visit (HOSPITAL_COMMUNITY)
Admission: RE | Admit: 2020-02-09 | Discharge: 2020-02-09 | Disposition: A | Discharge status: Home / Self Care | Payer: Self-pay | Source: Ambulatory Visit | Attending: Gastroenterology | Admitting: Gastroenterology

## 2020-02-09 ENCOUNTER — Ambulatory Visit
Admission: RE | Admit: 2020-02-09 | Discharge: 2020-02-09 | Disposition: A | Discharge status: Home / Self Care | Payer: Self-pay | Source: Ambulatory Visit | Attending: Radiation Oncology | Admitting: Radiation Oncology

## 2020-02-09 ENCOUNTER — Inpatient Hospital Stay: Payer: Self-pay | Attending: Radiation Oncology

## 2020-02-09 DIAGNOSIS — C159 Malignant neoplasm of esophagus, unspecified: Secondary | ICD-10-CM | POA: Insufficient documentation

## 2020-02-09 DIAGNOSIS — E876 Hypokalemia: Secondary | ICD-10-CM | POA: Insufficient documentation

## 2020-02-09 DIAGNOSIS — C154 Malignant neoplasm of middle third of esophagus: Secondary | ICD-10-CM

## 2020-02-09 DIAGNOSIS — N390 Urinary tract infection, site not specified: Secondary | ICD-10-CM | POA: Insufficient documentation

## 2020-02-09 DIAGNOSIS — R1319 Other dysphagia: Secondary | ICD-10-CM | POA: Insufficient documentation

## 2020-02-09 DIAGNOSIS — Z5111 Encounter for antineoplastic chemotherapy: Secondary | ICD-10-CM | POA: Insufficient documentation

## 2020-02-09 LAB — CMP (CANCER CENTER ONLY)
ALT: 8 U/L (ref 0–44)
AST: 18 U/L (ref 15–41)
Albumin: 3.3 g/dL — ABNORMAL LOW (ref 3.5–5.0)
Alkaline Phosphatase: 53 U/L (ref 38–126)
Anion gap: 5 (ref 5–15)
BUN: 5 mg/dL — ABNORMAL LOW (ref 6–20)
CO2: 27 mmol/L (ref 22–32)
Calcium: 9.6 mg/dL (ref 8.9–10.3)
Chloride: 101 mmol/L (ref 98–111)
Creatinine: 0.84 mg/dL (ref 0.44–1.00)
GFR, Estimated: >60 mL/min (ref >60)
Glucose, Bld: 82 mg/dL (ref 70–99)
Potassium: 4.5 mmol/L (ref 3.5–5.1)
Sodium: 133 mmol/L — ABNORMAL LOW (ref 135–145)
Total Bilirubin: 0.5 mg/dL (ref 0.3–1.2)
Total Protein: 9.7 g/dL — ABNORMAL HIGH (ref 6.5–8.1)

## 2020-02-09 LAB — CBC WITH DIFFERENTIAL (CANCER CENTER ONLY)
Abs Immature Granulocytes: 0.01 10*3/uL (ref 0.00–0.07)
Basophils Absolute: 0 10*3/uL (ref 0.0–0.1)
Basophils Relative: 0 %
Eosinophils Absolute: 0.1 10*3/uL (ref 0.0–0.5)
Eosinophils Relative: 1 %
HCT: 38.7 % (ref 36.0–46.0)
Hemoglobin: 12.8 g/dL (ref 12.0–15.0)
Immature Granulocytes: 0 %
Lymphocytes Relative: 23 %
Lymphs Abs: 1.8 10*3/uL (ref 0.7–4.0)
MCH: 29.8 pg (ref 26.0–34.0)
MCHC: 33.1 g/dL (ref 30.0–36.0)
MCV: 90.2 fL (ref 80.0–100.0)
Monocytes Absolute: 0.9 10*3/uL (ref 0.1–1.0)
Monocytes Relative: 12 %
Neutro Abs: 4.8 10*3/uL (ref 1.7–7.7)
Neutrophils Relative %: 64 %
Platelet Count: 189 10*3/uL (ref 150–400)
RBC: 4.29 MIL/uL (ref 3.87–5.11)
RDW: 12.1 % (ref 11.5–15.5)
WBC Count: 7.6 10*3/uL (ref 4.0–10.5)
nRBC: 0 % (ref 0.0–0.2)

## 2020-02-09 MED ORDER — IOHEXOL 300 MG/ML  SOLN
100.0000 mL | Freq: Once | INTRAMUSCULAR | Status: AC | PRN
  Administered 2020-02-09: 80 mL via INTRAVENOUS

## 2020-02-09 NOTE — Telephone Encounter (Signed)
Note being placed to close encounter.

## 2020-02-10 ENCOUNTER — Other Ambulatory Visit: Payer: Self-pay | Admitting: Physician Assistant

## 2020-02-10 ENCOUNTER — Other Ambulatory Visit: Payer: Self-pay

## 2020-02-10 ENCOUNTER — Encounter (HOSPITAL_COMMUNITY): Payer: Self-pay | Admitting: Gastroenterology

## 2020-02-10 ENCOUNTER — Ambulatory Visit
Admission: RE | Admit: 2020-02-10 | Discharge: 2020-02-10 | Disposition: A | Discharge status: Home / Self Care | Payer: Self-pay | Source: Ambulatory Visit | Attending: Radiation Oncology | Admitting: Radiation Oncology

## 2020-02-10 ENCOUNTER — Inpatient Hospital Stay: Payer: Self-pay

## 2020-02-10 ENCOUNTER — Encounter: Payer: Self-pay | Admitting: Gastroenterology

## 2020-02-10 ENCOUNTER — Other Ambulatory Visit: Payer: Self-pay | Admitting: Oncology

## 2020-02-10 VITALS — BP 149/88 | HR 76 | Temp 98.9°F | Resp 17 | Wt 94.2 lb

## 2020-02-10 DIAGNOSIS — C154 Malignant neoplasm of middle third of esophagus: Secondary | ICD-10-CM

## 2020-02-10 LAB — SURGICAL PATHOLOGY

## 2020-02-10 MED ORDER — SODIUM CHLORIDE 0.9 % IV SOLN
50.0000 mg/m2 | Freq: Once | INTRAVENOUS | Status: AC
  Administered 2020-02-10: 66 mg via INTRAVENOUS
  Filled 2020-02-10: qty 11

## 2020-02-10 MED ORDER — SODIUM CHLORIDE 0.9 % IV SOLN: 147.8000 mg | Freq: Once | INTRAVENOUS | Status: DC

## 2020-02-10 MED ORDER — DIPHENHYDRAMINE HCL 50 MG/ML IJ SOLN
INTRAMUSCULAR | Status: AC
  Filled 2020-02-10: qty 1

## 2020-02-10 MED ORDER — FAMOTIDINE IN NACL 20-0.9 MG/50ML-% IV SOLN
INTRAVENOUS | Status: AC
  Filled 2020-02-10: qty 50

## 2020-02-10 MED ORDER — PALONOSETRON HCL INJECTION 0.25 MG/5ML
0.2500 mg | Freq: Once | INTRAVENOUS | Status: AC
  Administered 2020-02-10: 0.25 mg via INTRAVENOUS

## 2020-02-10 MED ORDER — SODIUM CHLORIDE 0.9 % IV SOLN
Freq: Once | INTRAVENOUS | Status: AC
  Filled 2020-02-10: qty 250

## 2020-02-10 MED ORDER — SODIUM CHLORIDE 0.9 % IV SOLN
20.0000 mg | Freq: Once | INTRAVENOUS | Status: AC
  Administered 2020-02-10: 20 mg via INTRAVENOUS
  Filled 2020-02-10: qty 20

## 2020-02-10 MED ORDER — DIPHENHYDRAMINE HCL 50 MG/ML IJ SOLN
25.0000 mg | Freq: Once | INTRAMUSCULAR | Status: AC
  Administered 2020-02-10: 25 mg via INTRAVENOUS

## 2020-02-10 MED ORDER — PALONOSETRON HCL INJECTION 0.25 MG/5ML
INTRAVENOUS | Status: AC
  Filled 2020-02-10: qty 5

## 2020-02-10 MED ORDER — SODIUM CHLORIDE 0.9 % IV SOLN
147.8000 mg | Freq: Once | INTRAVENOUS | Status: AC
  Administered 2020-02-10: 150 mg via INTRAVENOUS
  Filled 2020-02-10: qty 15

## 2020-02-10 MED ORDER — FAMOTIDINE IN NACL 20-0.9 MG/50ML-% IV SOLN
20.0000 mg | Freq: Once | INTRAVENOUS | Status: AC
  Administered 2020-02-10: 20 mg via INTRAVENOUS

## 2020-02-10 NOTE — Progress Notes (Signed)
Nutrition Follow-up:  Patient with esophageal cancer followed by Dr. Benay Spice.  Patient receiving concurrent chemotherapy and radiation treatment.   Met with patient during infusion.  Patient eating [place] sandwich and chips during visit.  Reports that her appetite is good and she is not having trouble swallowing after dilation.  Reports that she has been eating sausage eggs, cheese toast or grits or cereal, omelettes for breakfast.  Lunch is cooked meal (pinto beans, pork chops or meat, cornbread).  Supper is leftovers from lunch.  Drinks boost shakes TID.  Says she likes boost better than ensure.      Medications: reviewed  Labs: reviewed  Anthropometrics:   Weight 94 lb 4 oz today decreased from 95 lb 9 oz on 10/19.     NUTRITION DIAGNOSIS: Unintentional weight loss stable    INTERVENTION:  Encouraged patient to choose high calorie shake (boost plus, 360 calories) TID Encouraged patient to add sauces, gravies, etc to make foods moist and to add calories.  Patient has contact information    MONITORING, EVALUATION, GOAL: weight trends, intake   NEXT VISIT: Nov 9 f/u in infusion  Alexandria Buck B. Zenia Resides, [place], [place] Registered Dietitian [phone] (mobile)

## 2020-02-10 NOTE — Patient Instructions (Signed)
[facility] Discharge Instructions for Patients Receiving Chemotherapy  Today you received the following chemotherapy agents: Paclitaxel, Carboplatin  To help prevent nausea and vomiting after your treatment, we encourage you to take your nausea medication as directed.   If you develop nausea and vomiting that is not controlled by your nausea medication, call the clinic.   BELOW ARE SYMPTOMS THAT SHOULD BE REPORTED IMMEDIATELY:  *FEVER GREATER THAN 100.5 F  *CHILLS WITH OR WITHOUT FEVER  NAUSEA AND VOMITING THAT IS NOT CONTROLLED WITH YOUR NAUSEA MEDICATION  *UNUSUAL SHORTNESS OF BREATH  *UNUSUAL BRUISING OR BLEEDING  TENDERNESS IN MOUTH AND THROAT WITH OR WITHOUT PRESENCE OF ULCERS  *URINARY PROBLEMS  *BOWEL PROBLEMS  UNUSUAL RASH Items with * indicate a potential emergency and should be followed up as soon as possible.  Feel free to call the clinic should you have any questions or concerns. The clinic phone number is [phone].  Please show the CHEMO ALERT CARD at check-in to the Emergency Department and triage nurse.  Paclitaxel injection What is this medicine? PACLITAXEL (PAK li TAX el) is a chemotherapy drug. It targets fast dividing cells, like cancer cells, and causes these cells to die. This medicine is used to treat ovarian cancer, breast cancer, lung cancer, Kaposi's sarcoma, and other cancers. This medicine may be used for other purposes; ask your health care provider or pharmacist if you have questions. COMMON BRAND NAME(S): Onxol, Taxol What should I tell my health care provider before I take this medicine? They need to know if you have any of these conditions:  history of irregular heartbeat  liver disease  low blood counts, like low white cell, platelet, or red cell counts  lung or breathing disease, like asthma  tingling of the fingers or toes, or other nerve disorder  an unusual or allergic reaction to paclitaxel, alcohol,  polyoxyethylated castor oil, other chemotherapy, other medicines, foods, dyes, or preservatives  pregnant or trying to get pregnant  breast-feeding How should I use this medicine? This drug is given as an infusion into a vein. It is administered in a hospital or clinic by a specially trained health care professional. Talk to your pediatrician regarding the use of this medicine in children. Special care may be needed. Overdosage: If you think you have taken too much of this medicine contact a poison control center or emergency room at once. NOTE: This medicine is only for you. Do not share this medicine with others. What if I miss a dose? It is important not to miss your dose. Call your doctor or health care professional if you are unable to keep an appointment. What may interact with this medicine? Do not take this medicine with any of the following medications:  disulfiram  metronidazole This medicine may also interact with the following medications:  antiviral medicines for hepatitis, HIV or AIDS  certain antibiotics like erythromycin and clarithromycin  certain medicines for fungal infections like ketoconazole and itraconazole  certain medicines for seizures like carbamazepine, phenobarbital, phenytoin  gemfibrozil  nefazodone  rifampin  St. John's wort This list may not describe all possible interactions. Give your health care provider a list of all the medicines, herbs, non-prescription drugs, or dietary supplements you use. Also tell them if you smoke, drink alcohol, or use illegal drugs. Some items may interact with your medicine. What should I watch for while using this medicine? Your condition will be monitored carefully while you are receiving this medicine. You will need important blood   work done while you are taking this medicine. This medicine can cause serious allergic reactions. To reduce your risk you will need to take other medicine(s) before treatment with this  medicine. If you experience allergic reactions like skin rash, itching or hives, swelling of the face, lips, or tongue, tell your doctor or health care professional right away. In some cases, you may be given additional medicines to help with side effects. Follow all directions for their use. This drug may make you feel generally unwell. This is not uncommon, as chemotherapy can affect healthy cells as well as cancer cells. Report any side effects. Continue your course of treatment even though you feel ill unless your doctor tells you to stop. Call your doctor or health care professional for advice if you get a fever, chills or sore throat, or other symptoms of a cold or flu. Do not treat yourself. This drug decreases your body's ability to fight infections. Try to avoid being around people who are sick. This medicine may increase your risk to bruise or bleed. Call your doctor or health care professional if you notice any unusual bleeding. Be careful brushing and flossing your teeth or using a toothpick because you may get an infection or bleed more easily. If you have any dental work done, tell your dentist you are receiving this medicine. Avoid taking products that contain aspirin, acetaminophen, ibuprofen, naproxen, or ketoprofen unless instructed by your doctor. These medicines may hide a fever. Do not become pregnant while taking this medicine. Women should inform their doctor if they wish to become pregnant or think they might be pregnant. There is a potential for serious side effects to an unborn child. Talk to your health care professional or pharmacist for more information. Do not breast-feed an infant while taking this medicine. Men are advised not to father a child while receiving this medicine. This product may contain alcohol. Ask your pharmacist or healthcare provider if this medicine contains alcohol. Be sure to tell all healthcare providers you are taking this medicine. Certain medicines,  like metronidazole and disulfiram, can cause an unpleasant reaction when taken with alcohol. The reaction includes flushing, headache, nausea, vomiting, sweating, and increased thirst. The reaction can last from 30 minutes to several hours. What side effects may I notice from receiving this medicine? Side effects that you should report to your doctor or health care professional as soon as possible:  allergic reactions like skin rash, itching or hives, swelling of the face, lips, or tongue  breathing problems  changes in vision  fast, irregular heartbeat  high or low blood pressure  mouth sores  pain, tingling, numbness in the hands or feet  signs of decreased platelets or bleeding - bruising, pinpoint red spots on the skin, black, tarry stools, blood in the urine  signs of decreased red blood cells - unusually weak or tired, feeling faint or lightheaded, falls  signs of infection - fever or chills, cough, sore throat, pain or difficulty passing urine  signs and symptoms of liver injury like dark yellow or brown urine; general ill feeling or flu-like symptoms; light-colored stools; loss of appetite; nausea; right upper belly pain; unusually weak or tired; yellowing of the eyes or skin  swelling of the ankles, feet, hands  unusually slow heartbeat Side effects that usually do not require medical attention (report to your doctor or health care professional if they continue or are bothersome):  diarrhea  hair loss  loss of appetite  muscle or joint pain    nausea, vomiting  pain, redness, or irritation at site where injected  tiredness This list may not describe all possible side effects. Call your doctor for medical advice about side effects. You may report side effects to FDA at 1-800-FDA-1088. Where should I keep my medicine? This drug is given in a hospital or clinic and will not be stored at home. NOTE: This sheet is a summary. It may not cover all possible information.  If you have questions about this medicine, talk to your doctor, pharmacist, or health care provider.  2020 Elsevier/Gold Standard (2016-11-27 13:14:55)  Carboplatin injection What is this medicine? CARBOPLATIN (KAR boe pla tin) is a chemotherapy drug. It targets fast dividing cells, like cancer cells, and causes these cells to die. This medicine is used to treat ovarian cancer and many other cancers. This medicine may be used for other purposes; ask your health care provider or pharmacist if you have questions. COMMON BRAND NAME(S): Paraplatin What should I tell my health care provider before I take this medicine? They need to know if you have any of these conditions:  blood disorders  hearing problems  kidney disease  recent or ongoing radiation therapy  an unusual or allergic reaction to carboplatin, cisplatin, other chemotherapy, other medicines, foods, dyes, or preservatives  pregnant or trying to get pregnant  breast-feeding How should I use this medicine? This drug is usually given as an infusion into a vein. It is administered in a hospital or clinic by a specially trained health care professional. Talk to your pediatrician regarding the use of this medicine in children. Special care may be needed. Overdosage: If you think you have taken too much of this medicine contact a poison control center or emergency room at once. NOTE: This medicine is only for you. Do not share this medicine with others. What if I miss a dose? It is important not to miss a dose. Call your doctor or health care professional if you are unable to keep an appointment. What may interact with this medicine?  medicines for seizures  medicines to increase blood counts like filgrastim, pegfilgrastim, sargramostim  some antibiotics like amikacin, gentamicin, neomycin, streptomycin, tobramycin  vaccines Talk to your doctor or health care professional before taking any of these  medicines:  acetaminophen  aspirin  ibuprofen  ketoprofen  naproxen This list may not describe all possible interactions. Give your health care provider a list of all the medicines, herbs, non-prescription drugs, or dietary supplements you use. Also tell them if you smoke, drink alcohol, or use illegal drugs. Some items may interact with your medicine. What should I watch for while using this medicine? Your condition will be monitored carefully while you are receiving this medicine. You will need important blood work done while you are taking this medicine. This drug may make you feel generally unwell. This is not uncommon, as chemotherapy can affect healthy cells as well as cancer cells. Report any side effects. Continue your course of treatment even though you feel ill unless your doctor tells you to stop. In some cases, you may be given additional medicines to help with side effects. Follow all directions for their use. Call your doctor or health care professional for advice if you get a fever, chills or sore throat, or other symptoms of a cold or flu. Do not treat yourself. This drug decreases your body's ability to fight infections. Try to avoid being around people who are sick. This medicine may increase your risk to bruise or bleed.   Call your doctor or health care professional if you notice any unusual bleeding. Be careful brushing and flossing your teeth or using a toothpick because you may get an infection or bleed more easily. If you have any dental work done, tell your dentist you are receiving this medicine. Avoid taking products that contain aspirin, acetaminophen, ibuprofen, naproxen, or ketoprofen unless instructed by your doctor. These medicines may hide a fever. Do not become pregnant while taking this medicine. Women should inform their doctor if they wish to become pregnant or think they might be pregnant. There is a potential for serious side effects to an unborn child. Talk  to your health care professional or pharmacist for more information. Do not breast-feed an infant while taking this medicine. What side effects may I notice from receiving this medicine? Side effects that you should report to your doctor or health care professional as soon as possible:  allergic reactions like skin rash, itching or hives, swelling of the face, lips, or tongue  signs of infection - fever or chills, cough, sore throat, pain or difficulty passing urine  signs of decreased platelets or bleeding - bruising, pinpoint red spots on the skin, black, tarry stools, nosebleeds  signs of decreased red blood cells - unusually weak or tired, fainting spells, lightheadedness  breathing problems  changes in hearing  changes in vision  chest pain  high blood pressure  low blood counts - This drug may decrease the number of white blood cells, red blood cells and platelets. You may be at increased risk for infections and bleeding.  nausea and vomiting  pain, swelling, redness or irritation at the injection site  pain, tingling, numbness in the hands or feet  problems with balance, talking, walking  trouble passing urine or change in the amount of urine Side effects that usually do not require medical attention (report to your doctor or health care professional if they continue or are bothersome):  hair loss  loss of appetite  metallic taste in the mouth or changes in taste This list may not describe all possible side effects. Call your doctor for medical advice about side effects. You may report side effects to FDA at 1-800-FDA-1088. Where should I keep my medicine? This drug is given in a hospital or clinic and will not be stored at home. NOTE: This sheet is a summary. It may not cover all possible information. If you have questions about this medicine, talk to your doctor, pharmacist, or health care provider.  2020 Elsevier/Gold Standard (2007-07-01 14:38:05)  

## 2020-02-11 ENCOUNTER — Ambulatory Visit
Admission: RE | Admit: 2020-02-11 | Discharge: 2020-02-11 | Disposition: A | Discharge status: Home / Self Care | Payer: Self-pay | Source: Ambulatory Visit | Attending: Radiation Oncology | Admitting: Radiation Oncology

## 2020-02-11 ENCOUNTER — Encounter (HOSPITAL_COMMUNITY): Payer: Self-pay

## 2020-02-11 NOTE — Progress Notes (Signed)
Pt here for patient teaching.  Pt given Radiation and You booklet, skin care instructions and Sonafine.  Reviewed areas of pertinence such as fatigue, hair loss, skin changes and throat changes . Pt able to give teach back of to pat skin, use unscented/gentle soap and drink plenty of water,apply Sonafine bid and avoid applying anything to skin within 4 hours of treatment. Pt verbalizes understanding of information given and will contact nursing with any questions or concerns.    Gloriajean Dell. Leonie Green, BSN

## 2020-02-11 NOTE — Anesthesia Postprocedure Evaluation (Signed)
Anesthesia Post Note  Patient: Alexandria Buck  Procedure(s) Performed: ESOPHAGOGASTRODUODENOSCOPY (EGD) WITH PROPOFOL (N/A ) BIOPSY ESOPHAGEAL DILATION     Patient location during evaluation: Endoscopy Anesthesia Type: MAC Level of consciousness: awake and alert Pain management: pain level controlled Vital Signs Assessment: post-procedure vital signs reviewed and stable Respiratory status: spontaneous breathing, nonlabored ventilation, respiratory function stable and patient connected to nasal cannula oxygen Cardiovascular status: blood pressure returned to baseline and stable Postop Assessment: no apparent nausea or vomiting Anesthetic complications: no   No complications documented.  Last Vitals:  Vitals:   02/08/20 1611 02/08/20 1621  BP: (!) 146/86 (!) 153/98  Pulse: (!) 57 60  Resp: 12 16  Temp:    SpO2: 99% 100%    Last Pain:  Vitals:   02/08/20 1621  TempSrc:   PainSc: 5                  Silvestre Mines S

## 2020-02-12 ENCOUNTER — Ambulatory Visit
Admission: RE | Admit: 2020-02-12 | Discharge: 2020-02-12 | Disposition: A | Discharge status: Home / Self Care | Payer: Self-pay | Source: Ambulatory Visit | Attending: Radiation Oncology | Admitting: Radiation Oncology

## 2020-02-12 ENCOUNTER — Telehealth: Payer: Self-pay | Admitting: Internal Medicine

## 2020-02-12 ENCOUNTER — Telehealth: Payer: Self-pay | Admitting: Gastroenterology

## 2020-02-12 DIAGNOSIS — C154 Malignant neoplasm of middle third of esophagus: Secondary | ICD-10-CM

## 2020-02-12 MED ORDER — SONAFINE EX EMUL
1.0000 "application " | Freq: Once | CUTANEOUS | Status: AC
  Administered 2020-02-12: 1 via TOPICAL

## 2020-02-12 NOTE — Telephone Encounter (Signed)
Copied from [place] [id number]. Topic: General - Inquiry >> Feb 12, 2020  4:43 PM Gillis Ends D wrote: Reason for CRM: Patient states that her doctor faxed over some paperwork in reference to her having a kidney or liver infection and she wanted Dr. Wynetta Emery to return the call. She can be reached at [phone]. She needs to be seen so call as soon as possible. Please advise

## 2020-02-12 NOTE — Telephone Encounter (Signed)
Alexandria Buck, Please let the patient know that I have reviewed her CT chest. I wish her luck moving forward with her upcoming treatment for her esophageal cancer. There is wall thickening along the middle esophagus which goes along with the patient's esophagus cancer. There does seem to be a small couple amount of lymph nodes in the region near the esophagus as well as in the right axilla. CT of the abdomen shows no evidence of liver disease as well as some nonspecific findings in the kidney. Otherwise relatively unremarkable abdominal examination. It may be worthwhile for the patient's primary care doctor to consider obtaining a urinalysis to evaluate whether she could have underlying UTI or cystitis. I will forward this to Dr. Wynetta Emery as well. FYI Dr. Wynetta Emery. GM    Irving Copas., MD  02/10/2020 11:30 PM EDT     Valina Maes, Please let the patient know that I have reviewed her CT chest/abdomen/pelvis. She has evidence of small lymph nodes in the mediastinum as well as in the axilla. Otherwise she has evidence of what looks to be the esophageal cancer. 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 I do not believe that these findings will change the course of action for [name] in regards to her neck steps for chemo XRT but I will forward these results to our oncology team as well as her PCP. Thanks. GM       The patient has been notified of this information and all questions answered. She is going to call her PCP and follow up with the kidney issues.  Report faxed to PCP and oncology.

## 2020-02-15 ENCOUNTER — Telehealth: Payer: Self-pay | Admitting: Internal Medicine

## 2020-02-15 ENCOUNTER — Ambulatory Visit
Admission: RE | Admit: 2020-02-15 | Discharge: 2020-02-15 | Disposition: A | Discharge status: Home / Self Care | Payer: Self-pay | Source: Ambulatory Visit | Attending: Radiation Oncology | Admitting: Radiation Oncology

## 2020-02-15 ENCOUNTER — Other Ambulatory Visit: Payer: Self-pay | Admitting: Oncology

## 2020-02-15 NOTE — Telephone Encounter (Signed)
Copied from [place] [id number]. Topic: General - Inquiry >> Feb 12, 2020  4:43 PM Gillis Ends D wrote: Reason for CRM: Patient states that her doctor faxed over some paperwork in reference to her having a kidney or liver infection and she wanted Dr. Wynetta Emery to return the call. She can be reached at [phone]. She needs to be seen so call as soon as possible. Please advise >> Feb 15, 2020  9:51 AM Yvette Rack wrote: Pt stated she is in pain and really need the medication for the infection. Pt requests that  Dr. Wynetta Emery call her back today.

## 2020-02-15 NOTE — Telephone Encounter (Signed)
Pt is requesting a call back from a nurse, pt did not want to disclose further information. Pt states it is in regards to the previous discussion with the nurse

## 2020-02-15 NOTE — Telephone Encounter (Addendum)
Pt is calling and would dr Wynetta Emery to call in her medication for her kidney pain. Pt states her medication should be free. Pt would like dr Wynetta Emery to return her call today at [phone]

## 2020-02-15 NOTE — Telephone Encounter (Signed)
I spoke with the pt and she asked me to send the recent CT scan to Dr Wynetta Emery again.  She is trying to get an appt and the first available is January. I have sent the report and recommendation per Dr Rush Landmark.

## 2020-02-16 ENCOUNTER — Ambulatory Visit
Admission: RE | Admit: 2020-02-16 | Discharge: 2020-02-16 | Disposition: A | Discharge status: Home / Self Care | Payer: Self-pay | Source: Ambulatory Visit | Attending: Radiation Oncology | Admitting: Radiation Oncology

## 2020-02-16 ENCOUNTER — Inpatient Hospital Stay: Payer: Self-pay

## 2020-02-16 ENCOUNTER — Telehealth: Payer: Self-pay

## 2020-02-16 ENCOUNTER — Other Ambulatory Visit: Payer: Self-pay | Admitting: *Deleted

## 2020-02-16 ENCOUNTER — Inpatient Hospital Stay (HOSPITAL_BASED_OUTPATIENT_CLINIC_OR_DEPARTMENT_OTHER): Payer: Self-pay | Admitting: Medical

## 2020-02-16 ENCOUNTER — Ambulatory Visit: Payer: Self-pay

## 2020-02-16 ENCOUNTER — Other Ambulatory Visit: Payer: Self-pay

## 2020-02-16 ENCOUNTER — Encounter: Payer: Self-pay | Admitting: Oncology

## 2020-02-16 VITALS — BP 137/71 | HR 84 | Temp 98.7°F | Resp 16 | Ht 61.0 in | Wt 94.7 lb

## 2020-02-16 DIAGNOSIS — R300 Dysuria: Secondary | ICD-10-CM

## 2020-02-16 DIAGNOSIS — N390 Urinary tract infection, site not specified: Secondary | ICD-10-CM

## 2020-02-16 DIAGNOSIS — C154 Malignant neoplasm of middle third of esophagus: Secondary | ICD-10-CM

## 2020-02-16 DIAGNOSIS — R109 Unspecified abdominal pain: Secondary | ICD-10-CM

## 2020-02-16 LAB — CBC WITH DIFFERENTIAL (CANCER CENTER ONLY)
Abs Immature Granulocytes: 0.06 K/uL (ref 0.00–0.07)
Basophils Absolute: 0 K/uL (ref 0.0–0.1)
Basophils Relative: 0 %
Eosinophils Absolute: 0.2 K/uL (ref 0.0–0.5)
Eosinophils Relative: 3 %
HCT: 37.6 % (ref 36.0–46.0)
Hemoglobin: 12.4 g/dL (ref 12.0–15.0)
Immature Granulocytes: 1 %
Lymphocytes Relative: 16 %
Lymphs Abs: 0.7 K/uL (ref 0.7–4.0)
MCH: 30 pg (ref 26.0–34.0)
MCHC: 33 g/dL (ref 30.0–36.0)
MCV: 90.8 fL (ref 80.0–100.0)
Monocytes Absolute: 0.5 K/uL (ref 0.1–1.0)
Monocytes Relative: 10 %
Neutro Abs: 3.3 K/uL (ref 1.7–7.7)
Neutrophils Relative %: 70 %
Platelet Count: 229 K/uL (ref 150–400)
RBC: 4.14 MIL/uL (ref 3.87–5.11)
RDW: 12 % (ref 11.5–15.5)
WBC Count: 4.7 K/uL (ref 4.0–10.5)
nRBC: 0 % (ref 0.0–0.2)

## 2020-02-16 LAB — BASIC METABOLIC PANEL - CANCER CENTER ONLY
Anion gap: 5 (ref 5–15)
BUN: 7 mg/dL (ref 6–20)
CO2: 27 mmol/L (ref 22–32)
Calcium: 9.1 mg/dL (ref 8.9–10.3)
Chloride: 103 mmol/L (ref 98–111)
Creatinine: 0.76 mg/dL (ref 0.44–1.00)
GFR, Estimated: >60 mL/min (ref >60)
Glucose, Bld: 83 mg/dL (ref 70–99)
Potassium: 3.9 mmol/L (ref 3.5–5.1)
Sodium: 135 mmol/L (ref 135–145)

## 2020-02-16 LAB — URINALYSIS, COMPLETE (UACMP) WITH MICROSCOPIC
Bilirubin Urine: NEGATIVE
Glucose, UA: NEGATIVE mg/dL
Hgb urine dipstick: NEGATIVE
Ketones, ur: NEGATIVE mg/dL
Nitrite: POSITIVE — AB
Protein, ur: NEGATIVE mg/dL
Specific Gravity, Urine: 1.01 (ref 1.005–1.030)
WBC, UA: >50 WBC/hpf — ABNORMAL HIGH (ref 0–5)
pH: 5 (ref 5.0–8.0)

## 2020-02-16 MED ORDER — DEXTROSE 5 % IV SOLN
1.0000 g | Freq: Once | INTRAVENOUS | Status: AC
  Administered 2020-02-16: 1 g via INTRAVENOUS
  Filled 2020-02-16: qty 10

## 2020-02-16 MED ORDER — AMOXICILLIN-POT CLAVULANATE 250-62.5 MG/5ML PO SUSR: ORAL | 0 refills | Status: DC

## 2020-02-16 MED ORDER — SODIUM CHLORIDE 0.9 % IV SOLN
INTRAVENOUS | Status: AC
  Filled 2020-02-16: qty 250

## 2020-02-16 MED ORDER — DEXTROSE 5 % IV SOLN: 1.0000 g | Freq: Once | INTRAVENOUS | Status: DC

## 2020-02-16 MED ORDER — MORPHINE SULFATE (PF) 2 MG/ML IV SOLN
INTRAVENOUS | Status: AC
  Filled 2020-02-16: qty 1

## 2020-02-16 MED ORDER — MORPHINE SULFATE (PF) 2 MG/ML IV SOLN
1.0000 mg | Freq: Once | INTRAVENOUS | Status: AC
  Administered 2020-02-16: 1 mg via INTRAVENOUS

## 2020-02-16 NOTE — Patient Instructions (Signed)
Ceftriaxone Injection What is this medicine? CEFTRIAXONE (sef try AX one) is a cephalosporin antibiotic. It treats some infections caused by bacteria. It will not work for colds, the flu, or other viruses. This medicine may be used for other purposes; ask your health care provider or pharmacist if you have questions. COMMON BRAND NAME(S): Ceftrisol Plus, Rocephin What should I tell my health care provider before I take this medicine? They need to know if you have any of these conditions:  any chronic illness  bowel disease, like colitis  both kidney and liver disease  high bilirubin level in newborn patients  an unusual or allergic reaction to ceftriaxone, other cephalosporin or penicillin antibiotics, foods, dyes, or preservatives  pregnant or trying to get pregnant  breast-feeding How should I use this medicine? This drug is injected into a muscle or a vein. It is usually given by a health care provider in a hospital or clinic setting. If you get this drug at home, you will be taught how to prepare and give it. Use exactly as directed. Take it as directed on the prescription label at the same time every day. Keep taking it unless your health care provider tells you to stop. It is important that you put your used needles and syringes in a special sharps container. Do not put them in a trash can. If you do not have a sharps container, call your pharmacist or health care provider to get one. Talk to your health care provider about the use of this drug in children. While it may be prescribed for children as young as newborns for selected conditions, precautions do apply. Overdosage: If you think you have taken too much of this medicine contact a poison control center or emergency room at once. NOTE: This medicine is only for you. Do not share this medicine with others. What if I miss a dose? It is important not to miss your dose. Call your health care provider if you are unable to keep an  appointment. If you give yourself this drug at home and you miss a dose, take it as soon as you can. If it is almost time for your next dose, take only that dose. Do not take double or extra doses. What may interact with this medicine? Do not take this medicine with any of the following medications:  intravenous calcium This medicine may also interact with the following medications:  birth control pills This list may not describe all possible interactions. Give your health care provider a list of all the medicines, herbs, non-prescription drugs, or dietary supplements you use. Also tell them if you smoke, drink alcohol, or use illegal drugs. Some items may interact with your medicine. What should I watch for while using this medicine? Tell your doctor or health care provider if your symptoms do not improve or if they get worse. This medicine may cause serious skin reactions. They can happen weeks to months after starting the medicine. Contact your health care provider right away if you notice fevers or flu-like symptoms with a rash. The rash may be red or purple and then turn into blisters or peeling of the skin. Or, you might notice a red rash with swelling of the face, lips or lymph nodes in your neck or under your arms. Do not treat diarrhea with over the counter products. Contact your doctor if you have diarrhea that lasts more than 2 days or if it is severe and watery. If you are being treated for   a sexually transmitted disease, avoid sexual contact until you have finished your treatment. Having sex can infect your sexual partner. Calcium may bind to this medicine and cause lung or kidney problems. Avoid calcium products while taking this medicine and for 48 hours after taking the last dose of this medicine. What side effects may I notice from receiving this medicine? Side effects that you should report to your doctor or health care professional as soon as possible:  allergic reactions like  skin rash, itching or hives, swelling of the face, lips, or tongue  breathing problems  fever, chills  irregular heartbeat  pain when passing urine  redness, blistering, peeling, or loosening of the skin, including inside the mouth  seizures  stomach pain, cramps  unusual bleeding, bruising  unusually weak or tired Side effects that usually do not require medical attention (report to your doctor or health care professional if they continue or are bothersome):  diarrhea  dizzy, drowsy  headache  nausea, vomiting  pain, swelling, irritation where injected  stomach upset  sweating This list may not describe all possible side effects. Call your doctor for medical advice about side effects. You may report side effects to FDA at 1-800-FDA-1088. Where should I keep my medicine? Keep out of the reach of children and pets. You will be instructed on how to store this drug. Protect from light. Throw away any unused drug after the expiration date. NOTE: This sheet is a summary. It may not cover all possible information. If you have questions about this medicine, talk to your doctor, pharmacist, or health care provider.  2020 Elsevier/Gold Standard (2018-10-30 18:29:21) Rehydration, Adult Rehydration is the replacement of body fluids and salts and minerals (electrolytes) that are lost during dehydration. Dehydration is when there is not enough fluid or water in the body. This happens when you lose more fluids than you take in. Common causes of dehydration include:  Vomiting.  Diarrhea.  Excessive sweating, such as from heat exposure or exercise.  Taking medicines that cause the body to lose excess fluid (diuretics).  Impaired kidney function.  Not drinking enough fluid.  Certain illnesses or infections.  Certain poorly controlled long-term (chronic) illnesses, such as diabetes, heart disease, and kidney disease.  Symptoms of mild dehydration may include thirst, dry lips  and mouth, dry skin, and dizziness. Symptoms of severe dehydration may include increased heart rate, confusion, fainting, and not urinating. You can rehydrate by drinking certain fluids or getting fluids through an IV tube, as told by your health care provider. What are the risks? Generally, rehydration is safe. However, one problem that can happen is taking in too much fluid (overhydration). This is rare. If overhydration happens, it can cause an electrolyte imbalance, kidney failure, or a decrease in salt (sodium) levels in the body. How to rehydrate Follow instructions from your health care provider for rehydration. The kind of fluid you should drink and the amount you should drink depend on your condition.  If directed by your health care provider, drink an oral rehydration solution (ORS). This is a drink designed to treat dehydration that is found in pharmacies and retail stores. ? Make an ORS by following instructions on the package. ? Start by drinking small amounts, about  cup (120 mL) every 5-10 minutes. ? Slowly increase how much you drink until you have taken the amount recommended by your health care provider.  Drink enough clear fluids to keep your urine clear or pale yellow. If you were instructed to  drink an ORS, finish the ORS first, then start slowly drinking other clear fluids. Drink fluids such as: ? Water. Do not drink only water. Doing that can lead to having too little sodium in your body (hyponatremia). ? Ice chips. ? Fruit juice that you have added water to (diluted juice). ? Low-calorie sports drinks.  If you are severely dehydrated, your health care provider may recommend that you receive fluids through an IV tube in the hospital.  Do not take sodium tablets. Doing that can lead to the condition of having too much sodium in your body (hypernatremia). Eating while you rehydrate Follow instructions from your health care provider about what to eat while you rehydrate.  Your health care provider may recommend that you slowly begin eating regular foods in small amounts.  Eat foods that contain a healthy balance of electrolytes, such as bananas, oranges, potatoes, tomatoes, and spinach.  Avoid foods that are greasy or contain a lot of fat or sugar.  In some cases, you may get nutrition through a feeding tube that is passed through your nose and into your stomach (nasogastric tube, or NG tube). This may be done if you have uncontrolled vomiting or diarrhea. Beverages to avoid Certain beverages may make dehydration worse. While you rehydrate, avoid:  Alcohol.  Caffeine.  Drinks that contain a lot of sugar. These include: ? High-calorie sports drinks. ? Fruit juice that is not diluted. ? Soda.  Check nutrition labels to see how much sugar or caffeine a beverage contains. Signs of dehydration recovery You may be recovering from dehydration if:  You are urinating more often than before you started rehydrating.  Your urine is clear or pale yellow.  Your energy level improves.  You vomit less frequently.  You have diarrhea less frequently.  Your appetite improves or returns to normal.  You feel less dizzy or less light-headed.  Your skin tone and color start to look more normal. Contact a health care provider if:  You continue to have symptoms of mild dehydration, such as: ? Thirst. ? Dry lips. ? Slightly dry mouth. ? Dry, warm skin. ? Dizziness.  You continue to vomit or have diarrhea. Get help right away if:  You have symptoms of dehydration that get worse.  You feel: ? Confused. ? Weak. ? Like you are going to faint.  You have not urinated in 6-8 hours.  You have very dark urine.  You have trouble breathing.  Your heart rate while sitting still is over 100 beats a minute.  You cannot drink fluids without vomiting.  You have vomiting or diarrhea that: ? Gets worse. ? Does not go away.  You have a fever. This  information is not intended to replace advice given to you by your health care provider. Make sure you discuss any questions you have with your health care provider. Document Revised: 03/08/2017 Document Reviewed: 05/20/2015 Elsevier Patient Education  2020 [employer].

## 2020-02-16 NOTE — Telephone Encounter (Addendum)
Pt . Seen in symptom management clinic today for urinary infection and 10/10 back pain. Pt. Seen by Sandi Mealy PA pain medication, IV fluids, and antibiotic ordered. Pt. Tolerated all well pain on discharge 0/10. Patient to return tomorrow for another round of antibiotics.

## 2020-02-16 NOTE — Progress Notes (Signed)
Pt is approved for the $1000 [employer] grant.  

## 2020-02-16 NOTE — Progress Notes (Signed)
Presented to lobby reporting severe back pain and feeling as if she has a "kidney infection". Will obtain UA, culture today.

## 2020-02-17 ENCOUNTER — Inpatient Hospital Stay: Payer: Self-pay

## 2020-02-17 ENCOUNTER — Ambulatory Visit
Admission: RE | Admit: 2020-02-17 | Discharge: 2020-02-17 | Disposition: A | Discharge status: Home / Self Care | Payer: Self-pay | Source: Ambulatory Visit | Attending: Radiation Oncology | Admitting: Radiation Oncology

## 2020-02-17 ENCOUNTER — Inpatient Hospital Stay (HOSPITAL_BASED_OUTPATIENT_CLINIC_OR_DEPARTMENT_OTHER): Payer: Self-pay | Admitting: Oncology

## 2020-02-17 ENCOUNTER — Other Ambulatory Visit: Payer: Self-pay

## 2020-02-17 VITALS — BP 154/85 | HR 68 | Temp 97.2°F | Resp 18 | Ht 61.0 in | Wt 97.5 lb

## 2020-02-17 DIAGNOSIS — C154 Malignant neoplasm of middle third of esophagus: Secondary | ICD-10-CM

## 2020-02-17 LAB — CMP (CANCER CENTER ONLY)
ALT: 7 U/L (ref 0–44)
AST: 16 U/L (ref 15–41)
Albumin: 3.2 g/dL — ABNORMAL LOW (ref 3.5–5.0)
Alkaline Phosphatase: 44 U/L (ref 38–126)
Anion gap: 8 (ref 5–15)
BUN: 7 mg/dL (ref 6–20)
CO2: 26 mmol/L (ref 22–32)
Calcium: 9.1 mg/dL (ref 8.9–10.3)
Chloride: 103 mmol/L (ref 98–111)
Creatinine: 0.78 mg/dL (ref 0.44–1.00)
GFR, Estimated: >60 mL/min (ref >60)
Glucose, Bld: 81 mg/dL (ref 70–99)
Potassium: 4 mmol/L (ref 3.5–5.1)
Sodium: 137 mmol/L (ref 135–145)
Total Bilirubin: 0.5 mg/dL (ref 0.3–1.2)
Total Protein: 8.6 g/dL — ABNORMAL HIGH (ref 6.5–8.1)

## 2020-02-17 MED ORDER — SODIUM CHLORIDE 0.9 % IV SOLN
50.0000 mg/m2 | Freq: Once | INTRAVENOUS | Status: AC
  Administered 2020-02-17: 66 mg via INTRAVENOUS
  Filled 2020-02-17: qty 11

## 2020-02-17 MED ORDER — CIPROFLOXACIN HCL 500 MG PO TABS: 500.0000 mg | ORAL_TABLET | Freq: Two times a day (BID) | ORAL | 0 refills | Status: DC

## 2020-02-17 MED ORDER — PALONOSETRON HCL INJECTION 0.25 MG/5ML
INTRAVENOUS | Status: AC
  Filled 2020-02-17: qty 5

## 2020-02-17 MED ORDER — DIPHENHYDRAMINE HCL 50 MG/ML IJ SOLN
INTRAMUSCULAR | Status: AC
  Filled 2020-02-17: qty 1

## 2020-02-17 MED ORDER — SODIUM CHLORIDE 0.9 % IV SOLN
152.8000 mg | Freq: Once | INTRAVENOUS | Status: AC
  Administered 2020-02-17: 150 mg via INTRAVENOUS
  Filled 2020-02-17: qty 15

## 2020-02-17 MED ORDER — PALONOSETRON HCL INJECTION 0.25 MG/5ML
0.2500 mg | Freq: Once | INTRAVENOUS | Status: AC
  Administered 2020-02-17: 0.25 mg via INTRAVENOUS

## 2020-02-17 MED ORDER — SODIUM CHLORIDE 0.9 % IV SOLN
10.0000 mg | Freq: Once | INTRAVENOUS | Status: AC
  Administered 2020-02-17: 10 mg via INTRAVENOUS
  Filled 2020-02-17: qty 10

## 2020-02-17 MED ORDER — FAMOTIDINE IN NACL 20-0.9 MG/50ML-% IV SOLN
20.0000 mg | Freq: Once | INTRAVENOUS | Status: AC
  Administered 2020-02-17: 20 mg via INTRAVENOUS

## 2020-02-17 MED ORDER — FAMOTIDINE IN NACL 20-0.9 MG/50ML-% IV SOLN
INTRAVENOUS | Status: AC
  Filled 2020-02-17: qty 50

## 2020-02-17 MED ORDER — DEXTROSE 5 % IV SOLN
1.0000 g | Freq: Once | INTRAVENOUS | Status: AC
  Administered 2020-02-17: 1 g via INTRAVENOUS
  Filled 2020-02-17: qty 10

## 2020-02-17 MED ORDER — DIPHENHYDRAMINE HCL 50 MG/ML IJ SOLN
25.0000 mg | Freq: Once | INTRAMUSCULAR | Status: AC
  Administered 2020-02-17: 25 mg via INTRAVENOUS

## 2020-02-17 MED ORDER — SODIUM CHLORIDE 0.9 % IV SOLN
Freq: Once | INTRAVENOUS | Status: AC
  Filled 2020-02-17: qty 250

## 2020-02-17 MED FILL — ?CARVEDILOL 12.5 MG TABLET: 12.5 | 30 days supply | Qty: 60 | Fill #1

## 2020-02-17 MED FILL — FUROSEMIDE 20 MG TABS: 20 | 30 days supply | Qty: 15 | Fill #0

## 2020-02-17 MED FILL — CIPROFLOXACIN HCL 500 MG TA: 500 | 7 days supply | Qty: 14 | Fill #0

## 2020-02-17 MED FILL — ?SPIRONOLACTONE 25 MG TABLE: 25 | 30 days supply | Qty: 15 | Fill #1

## 2020-02-17 MED FILL — ENTRESTO 49 MG-51 MG TABLET: 49-51 | 30 days supply | Qty: 60 | Fill #1

## 2020-02-17 NOTE — Telephone Encounter (Signed)
Will forward to pcp

## 2020-02-17 NOTE — Progress Notes (Signed)
Nutrition Follow-up:   Patient with esophageal cancer followed by Dr Benay Spice.  Patient receiving concurrent chemotherapy and radiation therapy. Patient seen in [facility] for UTI.  Met with patient in infusion.  Patient sleeping at the time of visit.  Easily awaken.  Patient shakes head that her appetite is good.  3/4 of sandwich eaten at chairside and graham crackers.  Says that she is going to finish sandwich when she wakes up.  Reports that she is drinking 3 shakes per day. Denies trouble swallowing    Medications: reviewed  Labs: reviewed  Anthropometrics:   Weight 97 lb today (? Fluids) 11/9 94 lb stable   NUTRITION DIAGNOSIS: Unintentional weight loss   INTERVENTION:  Continue oral nutrition supplements.  Continue high calorie, high protein shakes    MONITORING, EVALUATION, GOAL: weight trends, intake   NEXT VISIT: Nov 17 (Wednesday) during infusion  Neva Ramaswamy B. Zenia Resides, [place], [place] Registered Dietitian [phone] (mobile)

## 2020-02-17 NOTE — Patient Instructions (Signed)
   [facility] Discharge Instructions for Patients Receiving Chemotherapy  Today you received the following chemotherapy agents Taxol and Carboplatin   To help prevent nausea and vomiting after your treatment, we encourage you to take your nausea medication as directed.    If you develop nausea and vomiting that is not controlled by your nausea medication, call the clinic.   BELOW ARE SYMPTOMS THAT SHOULD BE REPORTED IMMEDIATELY:  *FEVER GREATER THAN 100.5 F  *CHILLS WITH OR WITHOUT FEVER  NAUSEA AND VOMITING THAT IS NOT CONTROLLED WITH YOUR NAUSEA MEDICATION  *UNUSUAL SHORTNESS OF BREATH  *UNUSUAL BRUISING OR BLEEDING  TENDERNESS IN MOUTH AND THROAT WITH OR WITHOUT PRESENCE OF ULCERS  *URINARY PROBLEMS  *BOWEL PROBLEMS  UNUSUAL RASH Items with * indicate a potential emergency and should be followed up as soon as possible.  Feel free to call the clinic should you have any questions or concerns. The clinic phone number is [phone].  Please show the CHEMO ALERT CARD at check-in to the Emergency Department and triage nurse.   

## 2020-02-17 NOTE — Progress Notes (Signed)
Reports she is out of carvedilol, furosemide,Entresto, spironolactone and paxil. She does not know how to call pharmacy to refill. [place] and was told all above meds have refills, except they have no record of the paxil being filled there. Requested they proceed w/refill on all except paxil. Patient notified that scripts will be ready for pick up later today. Also called [facility] on record for paxil (historical drug) and they had no record of it being filled there. Per Dr. Benay Spice: D/C Paxil. Patient reports that her PCP can't see her till January.

## 2020-02-17 NOTE — Progress Notes (Signed)
  [facility] OFFICE PROGRESS NOTE   Diagnosis: Esophagus cancer  INTERVAL HISTORY:   Alexandria Buck returns for a scheduled visit.  She began treatment with chemotherapy and radiation last week.  No symptom of allergic reaction.  No neuropathy symptoms. She developed severe back pain yesterday.  She was evaluated in the symptom management clinic and diagnosed with urinary tract infection.  The pain is much improved after a dose of ceftriaxone.  No fever.  She had noted "dark "urine prior to the UTI diagnosis.  She denies dysphagia.  Objective:  Vital signs in last 24 hours:  Blood pressure (!) 154/85, pulse 68, temperature (!) 97.2 F (36.2 C), temperature source Tympanic, resp. rate 18, height 5\' 1"  (1.549 m), weight 97 lb 8 oz (44.2 kg), SpO2 100 %.    HEENT: No thrush, 3-4 mm ulcer at the posterior left buccal mucosa Resp: Lungs clear bilaterally Cardio: Regular rate and rhythm GI: No hepatosplenomegaly, no mass, nontender Vascular: No leg edema Musculoskeletal: No spine or flank tenderness  Portacath/PICC-without erythema  Lab Results:  Lab Results  Component Value Date   WBC 4.7 02/16/2020   HGB 12.4 02/16/2020   HCT 37.6 02/16/2020   MCV 90.8 02/16/2020   PLT 229 02/16/2020   NEUTROABS 3.3 02/16/2020    CMP  Lab Results  Component Value Date   NA 135 02/16/2020   NA 137 02/16/2020   K 3.9 02/16/2020   K 4.0 02/16/2020   CL 103 02/16/2020   CL 103 02/16/2020   CO2 27 02/16/2020   CO2 26 02/16/2020   GLUCOSE 83 02/16/2020   GLUCOSE 81 02/16/2020   BUN 7 02/16/2020   BUN 7 02/16/2020   CREATININE 0.76 02/16/2020   CREATININE 0.78 02/16/2020   CALCIUM 9.1 02/16/2020   CALCIUM 9.1 02/16/2020   PROT 8.6 (H) 02/16/2020   ALBUMIN 3.2 (L) 02/16/2020   AST 16 02/16/2020   ALT 7 02/16/2020   ALKPHOS 44 02/16/2020   BILITOT 0.5 02/16/2020   GFRNONAA >60 02/16/2020   GFRNONAA >60 02/16/2020   GFRAA 87 12/31/2019     Medications: I have  reviewed the patient's current medications.   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 Benign upper esophageal stricture, status post dilation 01/14/2020, 02/08/2020 3. H. pylori gastritis, treated with medical therapy October 2021 4. Cardiomyopathy 5. Bipolar disorder 6. Tobacco and alcohol use 7. Hypertension 8. COVID-19 vaccination, last 01/05/2020 9. Urinary tract infection 02/16/2020     Disposition: Alexandria Buck appears to be tolerating the chemotherapy and radiation well.  She had severe back pain yesterday.  She was diagnosed with urinary tract infection.  A urine culture is pending.  She will receive another dose of ceftriaxone today.  Alexandria Buck will complete week #2 Taxol/carboplatin today.  She will return for chemotherapy in 1 week and an office visit in 2 weeks.  Betsy Coder, MD  02/17/2020  11:34 AM

## 2020-02-17 NOTE — Addendum Note (Signed)
Addended by: Karle Plumber B on: 02/17/2020 09:16 AM   Modules accepted: Orders

## 2020-02-17 NOTE — Telephone Encounter (Signed)
Contacted pt and was unable to lvm due to vm being full

## 2020-02-17 NOTE — Telephone Encounter (Signed)
I have reviwed labs done yesterday by oncologist.  UA with positive nitrates, large leukocytes and many bacteria.  Rxn sent to pharmacy or [facility].

## 2020-02-18 ENCOUNTER — Telehealth: Payer: Self-pay | Admitting: *Deleted

## 2020-02-18 ENCOUNTER — Telehealth: Payer: Self-pay | Admitting: Oncology

## 2020-02-18 ENCOUNTER — Ambulatory Visit
Admission: RE | Admit: 2020-02-18 | Discharge: 2020-02-18 | Disposition: A | Discharge status: Home / Self Care | Payer: Self-pay | Source: Ambulatory Visit | Attending: Radiation Oncology | Admitting: Radiation Oncology

## 2020-02-18 ENCOUNTER — Encounter: Payer: Self-pay | Admitting: Cardiothoracic Surgery

## 2020-02-18 ENCOUNTER — Other Ambulatory Visit (HOSPITAL_COMMUNITY): Payer: Self-pay | Admitting: Medical

## 2020-02-18 LAB — URINE CULTURE: Culture: >=100000 — AB

## 2020-02-18 MED ORDER — AMOXICILLIN-POT CLAVULANATE 250-62.5 MG/5ML PO SUSR: 500.0000 mg | Freq: Three times a day (TID) | ORAL | 0 refills | Status: DC

## 2020-02-18 MED FILL — AMOX-CLAV 600-42.9 MG/5 ML: 600-42.9 | 5 days supply | Qty: 75 | Fill #0

## 2020-02-18 NOTE — Telephone Encounter (Signed)
Scheduled appointments per 11/10 los. Called patient, no answer. Mailbox was full, unable to leave a message. Will mail updated calendar to patient.

## 2020-02-18 NOTE — Telephone Encounter (Signed)
Urine culture returned + UTI. Augmentin oral suspension sent to [facility] per Sandi Mealy, PA. Attempted to reach patient and daughter without success. Took copy of new prescription w/note to pick up at [facility] and to bring her grant letter to get the medication free to the radiation oncology at [place] #3 to give her when she comes in for treatment today.

## 2020-02-19 ENCOUNTER — Ambulatory Visit
Admission: RE | Admit: 2020-02-19 | Discharge: 2020-02-19 | Disposition: A | Discharge status: Home / Self Care | Payer: Self-pay | Source: Ambulatory Visit | Attending: Radiation Oncology | Admitting: Radiation Oncology

## 2020-02-19 ENCOUNTER — Other Ambulatory Visit: Payer: Self-pay | Admitting: Oncology

## 2020-02-19 ENCOUNTER — Telehealth: Payer: Self-pay | Admitting: *Deleted

## 2020-02-19 MED ORDER — PREDNISONE 10 MG PO TABS: 10.0000 mg | ORAL_TABLET | Freq: Every day | ORAL | 1 refills | Status: DC

## 2020-02-19 MED FILL — predniSONE 10 MG TABS: 10 | 30 days supply | Qty: 30 | Fill #0

## 2020-02-19 NOTE — Telephone Encounter (Signed)
Left VM that MD approved prednisone 10 mg daily for her appetite. Sent to [facility] outpatient pharmacy.

## 2020-02-21 ENCOUNTER — Other Ambulatory Visit: Payer: Self-pay | Admitting: Oncology

## 2020-02-22 ENCOUNTER — Ambulatory Visit
Admission: RE | Admit: 2020-02-22 | Discharge: 2020-02-22 | Disposition: A | Discharge status: Home / Self Care | Payer: Self-pay | Source: Ambulatory Visit | Attending: Radiation Oncology | Admitting: Radiation Oncology
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
## 2020-02-22 NOTE — Progress Notes (Signed)
erronous encounter

## 2020-02-23 ENCOUNTER — Ambulatory Visit
Admission: RE | Admit: 2020-02-23 | Discharge: 2020-02-23 | Disposition: A | Discharge status: Home / Self Care | Payer: Self-pay | Source: Ambulatory Visit | Attending: Radiation Oncology | Admitting: Radiation Oncology

## 2020-02-23 ENCOUNTER — Other Ambulatory Visit: Payer: Self-pay

## 2020-02-23 ENCOUNTER — Ambulatory Visit: Payer: Self-pay | Admitting: Cardiology

## 2020-02-24 ENCOUNTER — Inpatient Hospital Stay: Payer: Self-pay | Admitting: Nutrition

## 2020-02-24 ENCOUNTER — Inpatient Hospital Stay: Payer: Self-pay

## 2020-02-24 ENCOUNTER — Other Ambulatory Visit: Payer: Self-pay

## 2020-02-24 ENCOUNTER — Ambulatory Visit
Admission: RE | Admit: 2020-02-24 | Discharge: 2020-02-24 | Disposition: A | Discharge status: Home / Self Care | Payer: Self-pay | Source: Ambulatory Visit | Attending: Radiation Oncology | Admitting: Radiation Oncology

## 2020-02-24 VITALS — BP 129/89 | HR 80 | Temp 97.9°F | Resp 18 | Wt 90.0 lb

## 2020-02-24 DIAGNOSIS — C154 Malignant neoplasm of middle third of esophagus: Secondary | ICD-10-CM

## 2020-02-24 LAB — CBC WITH DIFFERENTIAL (CANCER CENTER ONLY)
Abs Immature Granulocytes: 0.01 10*3/uL (ref 0.00–0.07)
Basophils Absolute: 0 10*3/uL (ref 0.0–0.1)
Basophils Relative: 0 %
Eosinophils Absolute: 0 10*3/uL (ref 0.0–0.5)
Eosinophils Relative: 1 %
HCT: 37.3 % (ref 36.0–46.0)
Hemoglobin: 12.2 g/dL (ref 12.0–15.0)
Immature Granulocytes: 0 %
Lymphocytes Relative: 17 %
Lymphs Abs: 0.5 10*3/uL — ABNORMAL LOW (ref 0.7–4.0)
MCH: 29.8 pg (ref 26.0–34.0)
MCHC: 32.7 g/dL (ref 30.0–36.0)
MCV: 91 fL (ref 80.0–100.0)
Monocytes Absolute: 0.5 10*3/uL (ref 0.1–1.0)
Monocytes Relative: 19 %
Neutro Abs: 1.6 10*3/uL — ABNORMAL LOW (ref 1.7–7.7)
Neutrophils Relative %: 63 %
Platelet Count: 226 10*3/uL (ref 150–400)
RBC: 4.1 MIL/uL (ref 3.87–5.11)
RDW: 12 % (ref 11.5–15.5)
WBC Count: 2.6 10*3/uL — ABNORMAL LOW (ref 4.0–10.5)
nRBC: 0 % (ref 0.0–0.2)

## 2020-02-24 LAB — CMP (CANCER CENTER ONLY)
ALT: 11 U/L (ref 0–44)
AST: 22 U/L (ref 15–41)
Albumin: 3.3 g/dL — ABNORMAL LOW (ref 3.5–5.0)
Alkaline Phosphatase: 47 U/L (ref 38–126)
Anion gap: 10 (ref 5–15)
BUN: 11 mg/dL (ref 8–23)
CO2: 24 mmol/L (ref 22–32)
Calcium: 9.3 mg/dL (ref 8.9–10.3)
Chloride: 106 mmol/L (ref 98–111)
Creatinine: 0.81 mg/dL (ref 0.44–1.00)
GFR, Estimated: >60 mL/min (ref >60)
Glucose, Bld: 82 mg/dL (ref 70–99)
Potassium: 3.4 mmol/L — ABNORMAL LOW (ref 3.5–5.1)
Sodium: 140 mmol/L (ref 135–145)
Total Bilirubin: 0.5 mg/dL (ref 0.3–1.2)
Total Protein: 8.5 g/dL — ABNORMAL HIGH (ref 6.5–8.1)

## 2020-02-24 MED ORDER — DIPHENHYDRAMINE HCL 50 MG/ML IJ SOLN
25.0000 mg | Freq: Once | INTRAMUSCULAR | Status: AC
  Administered 2020-02-24: 25 mg via INTRAVENOUS

## 2020-02-24 MED ORDER — SODIUM CHLORIDE 0.9 % IV SOLN
50.0000 mg/m2 | Freq: Once | INTRAVENOUS | Status: AC
  Administered 2020-02-24: 66 mg via INTRAVENOUS
  Filled 2020-02-24: qty 11

## 2020-02-24 MED ORDER — PALONOSETRON HCL INJECTION 0.25 MG/5ML
0.2500 mg | Freq: Once | INTRAVENOUS | Status: AC
  Administered 2020-02-24: 0.25 mg via INTRAVENOUS

## 2020-02-24 MED ORDER — PALONOSETRON HCL INJECTION 0.25 MG/5ML
INTRAVENOUS | Status: AC
  Filled 2020-02-24: qty 5

## 2020-02-24 MED ORDER — FAMOTIDINE IN NACL 20-0.9 MG/50ML-% IV SOLN
20.0000 mg | Freq: Once | INTRAVENOUS | Status: AC
  Administered 2020-02-24: 20 mg via INTRAVENOUS

## 2020-02-24 MED ORDER — SODIUM CHLORIDE 0.9 % IV SOLN
Freq: Once | INTRAVENOUS | Status: AC
  Filled 2020-02-24: qty 250

## 2020-02-24 MED ORDER — SODIUM CHLORIDE 0.9 % IV SOLN
10.0000 mg | Freq: Once | INTRAVENOUS | Status: AC
  Administered 2020-02-24: 10 mg via INTRAVENOUS
  Filled 2020-02-24: qty 10

## 2020-02-24 MED ORDER — DIPHENHYDRAMINE HCL 50 MG/ML IJ SOLN
INTRAMUSCULAR | Status: AC
  Filled 2020-02-24: qty 1

## 2020-02-24 MED ORDER — SODIUM CHLORIDE 0.9 % IV SOLN
150.2000 mg | Freq: Once | INTRAVENOUS | Status: AC
  Administered 2020-02-24: 150 mg via INTRAVENOUS
  Filled 2020-02-24: qty 15

## 2020-02-24 MED ORDER — FAMOTIDINE IN NACL 20-0.9 MG/50ML-% IV SOLN
INTRAVENOUS | Status: AC
  Filled 2020-02-24: qty 50
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
## 2020-02-24 NOTE — Progress Notes (Signed)
Nutrition follow-up completed with patient during infusion for esophageal cancer.   Patient is receiving concurrent chemoradiation therapy.   Weight was decreased and documented as 90.0 pounds November 17 down from 97.5 pounds November 10. Patient reports weight loss is due to her not eating anything yesterday secondary to severe sore throat. She denies nausea, vomiting, constipation, or diarrhea. She has been drinking 2-3 bottles of boost every day. She has not picked up her prescription of prednisone yet.  Nutrition diagnosis: Unintentional weight loss continues.  Intervention: Educated patient to increase boost to boost plus or equivalent 3 times daily between meals. Continue small frequent meals and snacks. Pickup medication as prescribed by MD. Provided coupons for boost plus. Questions were answered.  Teach back method used.  Monitoring, evaluation, goals: Patient will tolerate increased calories and protein to promote weight gain.  Next visit: Wednesday, November 24 during infusion.  **Disclaimer: This note was dictated with voice recognition software. Similar sounding words can inadvertently be transcribed and this note may contain transcription errors which may not have been corrected upon publication of note.**

## 2020-02-24 NOTE — Patient Instructions (Signed)
   [facility] Discharge Instructions for Patients Receiving Chemotherapy  Today you received the following chemotherapy agents Taxol and Carboplatin   To help prevent nausea and vomiting after your treatment, we encourage you to take your nausea medication as directed.    If you develop nausea and vomiting that is not controlled by your nausea medication, call the clinic.   BELOW ARE SYMPTOMS THAT SHOULD BE REPORTED IMMEDIATELY:  *FEVER GREATER THAN 100.5 F  *CHILLS WITH OR WITHOUT FEVER  NAUSEA AND VOMITING THAT IS NOT CONTROLLED WITH YOUR NAUSEA MEDICATION  *UNUSUAL SHORTNESS OF BREATH  *UNUSUAL BRUISING OR BLEEDING  TENDERNESS IN MOUTH AND THROAT WITH OR WITHOUT PRESENCE OF ULCERS  *URINARY PROBLEMS  *BOWEL PROBLEMS  UNUSUAL RASH Items with * indicate a potential emergency and should be followed up as soon as possible.  Feel free to call the clinic should you have any questions or concerns. The clinic phone number is [phone].  Please show the CHEMO ALERT CARD at check-in to the Emergency Department and triage nurse.   

## 2020-02-25 ENCOUNTER — Ambulatory Visit: Payer: Self-pay

## 2020-02-26 ENCOUNTER — Ambulatory Visit
Admission: RE | Admit: 2020-02-26 | Discharge: 2020-02-26 | Disposition: A | Discharge status: Home / Self Care | Payer: Self-pay | Source: Ambulatory Visit | Attending: Radiation Oncology | Admitting: Radiation Oncology

## 2020-02-26 MED FILL — LOSARTAN POTASSIUM 25 MG TA: 25 | 30 days supply | Qty: 30 | Fill #0

## 2020-02-26 MED FILL — PROCHLORPERAZINE 5 MG TAB: 5 | 15 days supply | Qty: 60 | Fill #0

## 2020-02-28 ENCOUNTER — Other Ambulatory Visit: Payer: Self-pay | Admitting: Oncology
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
## 2020-02-29 ENCOUNTER — Telehealth: Payer: Self-pay | Admitting: *Deleted

## 2020-02-29 ENCOUNTER — Other Ambulatory Visit: Payer: Self-pay | Admitting: Radiation Oncology

## 2020-02-29 ENCOUNTER — Encounter: Payer: Self-pay | Admitting: Cardiology

## 2020-02-29 ENCOUNTER — Ambulatory Visit
Admission: RE | Admit: 2020-02-29 | Discharge: 2020-02-29 | Disposition: A | Discharge status: Home / Self Care | Payer: Self-pay | Source: Ambulatory Visit | Attending: Radiation Oncology | Admitting: Radiation Oncology

## 2020-02-29 ENCOUNTER — Other Ambulatory Visit: Payer: Self-pay

## 2020-02-29 ENCOUNTER — Ambulatory Visit (INDEPENDENT_AMBULATORY_CARE_PROVIDER_SITE_OTHER): Payer: Self-pay | Admitting: Cardiology

## 2020-02-29 VITALS — BP 136/92 | HR 83 | Ht 61.0 in | Wt 88.8 lb

## 2020-02-29 DIAGNOSIS — I10 Essential (primary) hypertension: Secondary | ICD-10-CM

## 2020-02-29 DIAGNOSIS — I340 Nonrheumatic mitral (valve) insufficiency: Secondary | ICD-10-CM

## 2020-02-29 DIAGNOSIS — I5042 Chronic combined systolic (congestive) and diastolic (congestive) heart failure: Secondary | ICD-10-CM

## 2020-02-29 DIAGNOSIS — R002 Palpitations: Secondary | ICD-10-CM

## 2020-02-29 MED ORDER — NITROGLYCERIN 0.4 MG SL SUBL: 0.4000 mg | SUBLINGUAL_TABLET | SUBLINGUAL | 3 refills | Status: DC | PRN

## 2020-02-29 MED ORDER — SUCRALFATE 1 GM/10ML PO SUSP: 1.0000 g | Freq: Three times a day (TID) | ORAL | 0 refills | Status: DC

## 2020-02-29 MED ORDER — FUROSEMIDE 20 MG PO TABS
20.0000 mg | ORAL_TABLET | ORAL | 1 refills | Status: DC
Start: 2020-02-29 — End: 2020-02-29

## 2020-02-29 MED ORDER — ALUM & MAG HYDROXIDE-SIMETH 200-200-20 MG/5ML PO SUSP: 5.0000 mL | Freq: Four times a day (QID) | ORAL | 1 refills | Status: DC | PRN

## 2020-02-29 MED FILL — FUROSEMIDE 20 MG TABS: 20 | 90 days supply | Qty: 45 | Fill #0

## 2020-02-29 MED FILL — SUCRALFATE 1 GM/10ML SUSP: 1 | 10 days supply | Qty: 420 | Fill #0

## 2020-02-29 MED FILL — GERI-LANTA LIQUID: 200-200-20 | 17 days supply | Qty: 355 | Fill #0

## 2020-02-29 MED FILL — NITROGLYCERIN 0.4 MG TAB SL: 0.4 | 8 days supply | Qty: 25 | Fill #0

## 2020-02-29 NOTE — Telephone Encounter (Signed)
left message with daughter for patient to give [place] a call to r/s her appointment with Dr Servando Snare.

## 2020-02-29 NOTE — Patient Instructions (Signed)
Medication Instructions:  Your physician recommends that you continue on your current medications as directed. Please refer to the Current Medication list given to you today.  *If you need a refill on your cardiac medications before your next appointment, please call your pharmacy*  Follow-Up: At [facility], you and your health needs are our priority.  As part of our continuing mission to provide you with exceptional heart care, we have created designated Provider Care Teams.  These Care Teams include your primary Cardiologist (physician) and Advanced Practice Providers (APPs -  Physician Assistants and Nurse Practitioners) who all work together to provide you with the care you need, when you need it.  We recommend signing up for the patient portal called "MyChart".  Sign up information is provided on this After Visit Summary.  MyChart is used to connect with patients for Virtual Visits (Telemedicine).  Patients are able to view lab/test results, encounter notes, upcoming appointments, etc.  Non-urgent messages can be sent to your provider as well.   To learn more about what you can do with MyChart, go to [email or web address].    Your next appointment:   3 month(s)  The format for your next appointment:   In Person  Provider:   Christopher Schumann, MD    

## 2020-03-01 ENCOUNTER — Ambulatory Visit
Admission: RE | Admit: 2020-03-01 | Discharge: 2020-03-01 | Disposition: A | Discharge status: Home / Self Care | Payer: Self-pay | Source: Ambulatory Visit | Attending: Radiation Oncology | Admitting: Radiation Oncology

## 2020-03-02 ENCOUNTER — Telehealth: Payer: Self-pay

## 2020-03-02 ENCOUNTER — Encounter: Payer: Self-pay | Admitting: Nurse Practitioner

## 2020-03-02 ENCOUNTER — Ambulatory Visit
Admission: RE | Admit: 2020-03-02 | Discharge: 2020-03-02 | Disposition: A | Discharge status: Home / Self Care | Payer: Self-pay | Source: Ambulatory Visit | Attending: Radiation Oncology | Admitting: Radiation Oncology

## 2020-03-02 ENCOUNTER — Inpatient Hospital Stay: Payer: Self-pay

## 2020-03-02 ENCOUNTER — Other Ambulatory Visit: Payer: Self-pay

## 2020-03-02 ENCOUNTER — Other Ambulatory Visit: Payer: Self-pay | Admitting: Radiation Oncology

## 2020-03-02 ENCOUNTER — Inpatient Hospital Stay: Payer: Self-pay | Admitting: Nutrition

## 2020-03-02 ENCOUNTER — Ambulatory Visit: Payer: Self-pay

## 2020-03-02 ENCOUNTER — Inpatient Hospital Stay (HOSPITAL_BASED_OUTPATIENT_CLINIC_OR_DEPARTMENT_OTHER): Payer: Self-pay | Admitting: Nurse Practitioner

## 2020-03-02 VITALS — BP 135/89 | HR 87 | Temp 97.7°F | Resp 15 | Ht 61.0 in | Wt 89.3 lb

## 2020-03-02 DIAGNOSIS — C154 Malignant neoplasm of middle third of esophagus: Secondary | ICD-10-CM

## 2020-03-02 LAB — BASIC METABOLIC PANEL - CANCER CENTER ONLY
Anion gap: 11 (ref 5–15)
BUN: 12 mg/dL (ref 8–23)
CO2: 24 mmol/L (ref 22–32)
Calcium: 9.3 mg/dL (ref 8.9–10.3)
Chloride: 104 mmol/L (ref 98–111)
Creatinine: 0.81 mg/dL (ref 0.44–1.00)
GFR, Estimated: >60 mL/min (ref >60)
Glucose, Bld: 78 mg/dL (ref 70–99)
Potassium: 3.2 mmol/L — ABNORMAL LOW (ref 3.5–5.1)
Sodium: 139 mmol/L (ref 135–145)

## 2020-03-02 LAB — CBC WITH DIFFERENTIAL (CANCER CENTER ONLY)
Abs Immature Granulocytes: 0 10*3/uL (ref 0.00–0.07)
Basophils Absolute: 0 10*3/uL (ref 0.0–0.1)
Basophils Relative: 0 %
Eosinophils Absolute: 0.1 10*3/uL (ref 0.0–0.5)
Eosinophils Relative: 3 %
HCT: 37.9 % (ref 36.0–46.0)
Hemoglobin: 12.6 g/dL (ref 12.0–15.0)
Immature Granulocytes: 0 %
Lymphocytes Relative: 25 %
Lymphs Abs: 0.4 10*3/uL — ABNORMAL LOW (ref 0.7–4.0)
MCH: 30.1 pg (ref 26.0–34.0)
MCHC: 33.2 g/dL (ref 30.0–36.0)
MCV: 90.5 fL (ref 80.0–100.0)
Monocytes Absolute: 0.3 10*3/uL (ref 0.1–1.0)
Monocytes Relative: 17 %
Neutro Abs: 0.8 10*3/uL — ABNORMAL LOW (ref 1.7–7.7)
Neutrophils Relative %: 55 %
Platelet Count: 183 10*3/uL (ref 150–400)
RBC: 4.19 MIL/uL (ref 3.87–5.11)
RDW: 12.2 % (ref 11.5–15.5)
WBC Count: 1.6 10*3/uL — ABNORMAL LOW (ref 4.0–10.5)
nRBC: 0 % (ref 0.0–0.2)

## 2020-03-02 MED ORDER — HYDROCODONE-ACETAMINOPHEN 7.5-325 MG/15ML PO SOLN: 10.0000 mL | Freq: Four times a day (QID) | ORAL | 0 refills | Status: DC | PRN

## 2020-03-02 MED ORDER — POTASSIUM CHLORIDE IN NACL 20-0.9 MEQ/L-% IV SOLN
INTRAVENOUS | Status: AC
  Filled 2020-03-02: qty 1000

## 2020-03-02 MED ORDER — LIDOCAINE VISCOUS HCL 2 % MT SOLN: 10.0000 mL | Freq: Four times a day (QID) | OROMUCOSAL | 1 refills | Status: DC | PRN

## 2020-03-02 MED FILL — HYDROCOD-APAP 7.5-325/15ML: 7.5-325 | 4 days supply | Qty: 240 | Fill #0

## 2020-03-02 MED FILL — LIDOCAINE 2% VISCOUS SOLN: 2 | 10 days supply | Qty: 400 | Fill #0

## 2020-03-02 NOTE — Telephone Encounter (Signed)
Patient stable upon discharge from the infusion room 03/02/20 @ 2:30 pm.

## 2020-03-02 NOTE — Progress Notes (Signed)
Nutrition follow up completed with patient during infusion for esophageal cancer. Patient wt decreased at 89.2 pounds on Nov 24. Reports her throat is very sore and she has difficulty eating. She is looking forward to Thanksgiving meal with her family. Reports drinking 3 Boost Plus daily. Denies nausea, vomiting, constipation, and diarrhea. Received prescription for pain medicine and lidocaine.  Nutrition Diagnosis: Unintentional Wt loss continues.  Intervention: Continue Boost Plus 3 times daily between meals. Increase oral intake at meals. Provided samples and coupons.  Monitoring, Evaluation, Goals: Patient will increase calories and protein to promote wt gain.  Next Visit:Wednesday, Dec 1.

## 2020-03-02 NOTE — Progress Notes (Addendum)
  [facility] OFFICE PROGRESS NOTE   Diagnosis: Esophagus cancer  INTERVAL HISTORY:   Alexandria Buck returns as scheduled.  She continues radiation.  She completed week 3 Taxol/carboplatin 02/24/2020.  She denies nausea/vomiting.  No mouth sores.  No diarrhea.  No numbness or tingling in the hands or feet.  She is having significant pain swallowing.  She is able to tolerate liquids.  Objective:  Vital signs in last 24 hours:  Blood pressure 135/89, pulse 87, temperature 97.7 F (36.5 C), temperature source Tympanic, resp. rate 15, height 5\' 1"  (1.549 m), weight 89 lb 4.8 oz (40.5 kg), SpO2 100 %.    HEENT: No ulcers. Resp: Lungs clear bilaterally. Cardio: Regular rate and rhythm. GI: No hepatosplenomegaly. Vascular: No leg edema.  Skin: Decrease in skin turgor.   Lab Results:  Lab Results  Component Value Date   WBC 1.6 (L) 03/02/2020   HGB 12.6 03/02/2020   HCT 37.9 03/02/2020   MCV 90.5 03/02/2020   PLT 183 03/02/2020   NEUTROABS 0.8 (L) 03/02/2020    Imaging:  No results found.  Medications: I have reviewed the patient's current medications.  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 Endoscopy 01/14/2020-partially obstructing mid esophagus mass ? PET scan 01/25/2020-hypermetabolic mid esophagus mass, mildly hypermetabolic right axillary lymph nodes ? Radiation 02/09/2020 ? Cycle 1 Taxol/carboplatin 02/10/2020 ? Cycle 2 Taxol/carboplatin 02/17/2020 ? Cycle 3 Taxol/carboplatin 02/24/2020 ? Chemotherapy held 03/02/2020 due to neutropenia 2. Benign upper esophageal stricture, status post dilation 01/14/2020, 02/08/2020 3. H. pylori gastritis, treated with medical therapy October 2021 4. Cardiomyopathy 5. Bipolar disorder 6. Tobacco and alcohol use 7. Hypertension 8. COVID-19 vaccination, last 01/05/2020 9. Urinary tract infection 02/16/2020 10. 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  She has  completed 3 cycles of weekly Taxol/carboplatin.  She continues radiation.  CBC from today shows neutropenia.  Chemotherapy will be held today.  Precautions reviewed.  She understands to contact the office with fever, chills, other signs of infection.    She has developed odynophagia related to radiation.  Dr. Lisbeth Renshaw prescribed lidocaine.  We will send a prescription to her pharmacy for hydrocodone elixir.  She appears dehydrated.  She will receive IV fluids today and 03/04/2020. At present she is able to tolerate liquids orally.  She will contact the office if she is unable to maintain adequate hydration by mouth.  She has hypokalemia.  She will receive potassium in the IV fluids.  We are placing Lasix on hold due to current dehydrated status.  Basic metabolic panel will be repeated 03/07/2020.  We will see her in follow-up prior to proceeding with treatment on 03/09/2020.  She will contact the office in the interim as outlined above or with any other problems.  Patient seen with Dr. Benay Spice.   Ned Card ANP/GNP-BC   03/02/2020  11:04 AM This was a shared visit with Ned Card.  Alexandria Buck has developed odynophagia.  We prescribed hydrocodone.  She will receive intravenous fluids today and again later this week.  I encouraged her to push fluids and a soft diet as tolerated.  She has mild neutropenia.  She will call for a fever.  Chemotherapy will be held this week.  Julieanne Manson, MD

## 2020-03-02 NOTE — Patient Instructions (Signed)
Hypokalemia Hypokalemia means that the amount of potassium in the blood is lower than normal. Potassium is a chemical (electrolyte) that helps regulate the amount of fluid in the body. It also stimulates muscle tightening (contraction) and helps nerves work properly. Normally, most of the body's potassium is inside cells, and only a very small amount is in the blood. Because the amount in the blood is so small, minor changes to potassium levels in the blood can be life-threatening. What are the causes? This condition may be caused by:  Antibiotic medicine.  Diarrhea or vomiting. Taking too much of a medicine that helps you have a bowel movement (laxative) can cause diarrhea and lead to hypokalemia.  Chronic kidney disease (CKD).  Medicines that help the body get rid of excess fluid (diuretics).  Eating disorders, such as bulimia.  Low magnesium levels in the body.  Sweating a lot. What are the signs or symptoms? Symptoms of this condition include:  Weakness.  Constipation.  Fatigue.  Muscle cramps.  Mental confusion.  Skipped heartbeats or irregular heartbeat (palpitations).  Tingling or numbness. How is this diagnosed? This condition is diagnosed with a blood test. How is this treated? This condition may be treated by:  Taking potassium supplements by mouth.  Adjusting the medicines that you take.  Eating more foods that contain a lot of potassium. If your potassium level is very low, you may need to get potassium through an IV and be monitored in the hospital. Follow these instructions at home:   Take over-the-counter and prescription medicines only as told by your health care provider. This includes vitamins and supplements.  Eat a healthy diet. A healthy diet includes fresh fruits and vegetables, whole grains, healthy fats, and lean proteins.  If instructed, eat more foods that contain a lot of potassium. This includes: ? Nuts, such as peanuts and  pistachios. ? Seeds, such as sunflower seeds and pumpkin seeds. ? Peas, lentils, and lima beans. ? Whole grain and bran cereals and breads. ? Fresh fruits and vegetables, such as apricots, avocado, bananas, cantaloupe, kiwi, oranges, tomatoes, asparagus, and potatoes. ? Orange juice. ? Tomato juice. ? Red meats. ? Yogurt.  Keep all follow-up visits as told by your health care provider. This is important. Contact a health care provider if you:  Have weakness that gets worse.  Feel your heart pounding or racing.  Vomit.  Have diarrhea.  Have diabetes (diabetes mellitus) and you have trouble keeping your blood sugar (glucose) in your target range. Get help right away if you:  Have chest pain.  Have shortness of breath.  Have vomiting or diarrhea that lasts for more than 2 days.  Faint. Summary  Hypokalemia means that the amount of potassium in the blood is lower than normal.  This condition is diagnosed with a blood test.  Hypokalemia may be treated by taking potassium supplements, adjusting the medicines that you take, or eating more foods that are high in potassium.  If your potassium level is very low, you may need to get potassium through an IV and be monitored in the hospital. This information is not intended to replace advice given to you by your health care provider. Make sure you discuss any questions you have with your health care provider. Document Revised: 11/06/2017 Document Reviewed: 11/06/2017 Elsevier Patient Education  [place].  Rehydration, Adult Rehydration is the replacement of body fluids and salts and minerals (electrolytes) that are lost during dehydration. Dehydration is when there is not enough fluid  or water in the body. This happens when you lose more fluids than you take in. Common causes of dehydration include:  Vomiting.  Diarrhea.  Excessive sweating, such as from heat exposure or exercise.  Taking medicines that cause the  body to lose excess fluid (diuretics).  Impaired kidney function.  Not drinking enough fluid.  Certain illnesses or infections.  Certain poorly controlled long-term (chronic) illnesses, such as diabetes, heart disease, and kidney disease.  Symptoms of mild dehydration may include thirst, dry lips and mouth, dry skin, and dizziness. Symptoms of severe dehydration may include increased heart rate, confusion, fainting, and not urinating. You can rehydrate by drinking certain fluids or getting fluids through an IV tube, as told by your health care provider. What are the risks? Generally, rehydration is safe. However, one problem that can happen is taking in too much fluid (overhydration). This is rare. If overhydration happens, it can cause an electrolyte imbalance, kidney failure, or a decrease in salt (sodium) levels in the body. How to rehydrate Follow instructions from your health care provider for rehydration. The kind of fluid you should drink and the amount you should drink depend on your condition.  If directed by your health care provider, drink an oral rehydration solution (ORS). This is a drink designed to treat dehydration that is found in pharmacies and retail stores. ? Make an ORS by following instructions on the package. ? Start by drinking small amounts, about  cup (120 mL) every 5-10 minutes. ? Slowly increase how much you drink until you have taken the amount recommended by your health care provider.  Drink enough clear fluids to keep your urine clear or pale yellow. If you were instructed to drink an ORS, finish the ORS first, then start slowly drinking other clear fluids. Drink fluids such as: ? Water. Do not drink only water. Doing that can lead to having too little sodium in your body (hyponatremia). ? Ice chips. ? Fruit juice that you have added water to (diluted juice). ? Low-calorie sports drinks.  If you are severely dehydrated, your health care provider may  recommend that you receive fluids through an IV tube in the hospital.  Do not take sodium tablets. Doing that can lead to the condition of having too much sodium in your body (hypernatremia). Eating while you rehydrate Follow instructions from your health care provider about what to eat while you rehydrate. Your health care provider may recommend that you slowly begin eating regular foods in small amounts.  Eat foods that contain a healthy balance of electrolytes, such as bananas, oranges, potatoes, tomatoes, and spinach.  Avoid foods that are greasy or contain a lot of fat or sugar.  In some cases, you may get nutrition through a feeding tube that is passed through your nose and into your stomach (nasogastric tube, or NG tube). This may be done if you have uncontrolled vomiting or diarrhea. Beverages to avoid Certain beverages may make dehydration worse. While you rehydrate, avoid:  Alcohol.  Caffeine.  Drinks that contain a lot of sugar. These include: ? High-calorie sports drinks. ? Fruit juice that is not diluted. ? Soda.  Check nutrition labels to see how much sugar or caffeine a beverage contains. Signs of dehydration recovery You may be recovering from dehydration if:  You are urinating more often than before you started rehydrating.  Your urine is clear or pale yellow.  Your energy level improves.  You vomit less frequently.  You have diarrhea less frequently.    Your appetite improves or returns to normal.  You feel less dizzy or less light-headed.  Your skin tone and color start to look more normal. Contact a health care provider if:  You continue to have symptoms of mild dehydration, such as: ? Thirst. ? Dry lips. ? Slightly dry mouth. ? Dry, warm skin. ? Dizziness.  You continue to vomit or have diarrhea. Get help right away if:  You have symptoms of dehydration that get worse.  You feel: ? Confused. ? Weak. ? Like you are going to faint.  You  have not urinated in 6-8 hours.  You have very dark urine.  You have trouble breathing.  Your heart rate while sitting still is over 100 beats a minute.  You cannot drink fluids without vomiting.  You have vomiting or diarrhea that: ? Gets worse. ? Does not go away.  You have a fever. This information is not intended to replace advice given to you by your health care provider. Make sure you discuss any questions you have with your health care provider. Document Revised: 03/08/2017 Document Reviewed: 05/20/2015 Elsevier Patient Education  2020 [employer]  

## 2020-03-04 ENCOUNTER — Telehealth: Payer: Self-pay | Admitting: Nurse Practitioner

## 2020-03-04 ENCOUNTER — Ambulatory Visit: Payer: Self-pay

## 2020-03-04 ENCOUNTER — Other Ambulatory Visit: Payer: Self-pay | Admitting: Nurse Practitioner

## 2020-03-04 ENCOUNTER — Telehealth: Payer: Self-pay | Admitting: Emergency Medicine

## 2020-03-04 DIAGNOSIS — C154 Malignant neoplasm of middle third of esophagus: Secondary | ICD-10-CM

## 2020-03-04 NOTE — Telephone Encounter (Signed)
Pt arrived at 0820 this am as she was told to come today at 0830 for 2hr IVF.  Received call from front registration desk.  Appt was described in NP Lisa's d/c notes but appt was never created/there was no scheduling message sent per registration.  Pt states that unless there is an open slot right now for her IVF she will go home and return next week for her appts.  No appts are available at this time.  Pt was pleasant and declined to stay, denies any questions or concerns at time of d/c.

## 2020-03-04 NOTE — Telephone Encounter (Signed)
Scheduled appointments per 11/24 los. Called patient to see if she could come in this afternoon for fluids. Patient stated since she came in this morning for an appointment she thought had been scheduled at 8:30, she didn't want to come back again. She declined to come in on Saturday too. I sent a message to Ned Card to ask if she wanted to add fluids in on Monday. Waiting for response.

## 2020-03-04 NOTE — Telephone Encounter (Signed)
Scheduled appointment per 11/24 los. Spoke to patient who is aware of appointment times. Had to schedule on 11/29 because of scheduling conflict, LOS was not sent to [facility]. Patient is aware of time gap between appointments.

## 2020-03-04 NOTE — Telephone Encounter (Signed)
Scheduled appointments per 11/24 los. Called patient, no answer. Left message for patient with appointments dates and times. Also asked patient to call back in order to confirm appointments.

## 2020-03-07 ENCOUNTER — Other Ambulatory Visit: Payer: Self-pay | Admitting: *Deleted

## 2020-03-07 ENCOUNTER — Other Ambulatory Visit: Payer: Self-pay

## 2020-03-07 ENCOUNTER — Inpatient Hospital Stay: Payer: Self-pay

## 2020-03-07 ENCOUNTER — Ambulatory Visit
Admission: RE | Admit: 2020-03-07 | Discharge: 2020-03-07 | Disposition: A | Discharge status: Home / Self Care | Payer: Self-pay | Source: Ambulatory Visit | Attending: Radiation Oncology | Admitting: Radiation Oncology

## 2020-03-07 DIAGNOSIS — C154 Malignant neoplasm of middle third of esophagus: Secondary | ICD-10-CM

## 2020-03-07 LAB — BASIC METABOLIC PANEL - CANCER CENTER ONLY
Anion gap: 10 (ref 5–15)
BUN: 11 mg/dL (ref 8–23)
CO2: 24 mmol/L (ref 22–32)
Calcium: 9 mg/dL (ref 8.9–10.3)
Chloride: 107 mmol/L (ref 98–111)
Creatinine: 0.84 mg/dL (ref 0.44–1.00)
GFR, Estimated: >60 mL/min (ref >60)
Glucose, Bld: 88 mg/dL (ref 70–99)
Potassium: 3.1 mmol/L — ABNORMAL LOW (ref 3.5–5.1)
Sodium: 141 mmol/L (ref 135–145)

## 2020-03-07 MED ORDER — POTASSIUM CHLORIDE IN NACL 20-0.9 MEQ/L-% IV SOLN
INTRAVENOUS | Status: AC
  Filled 2020-03-07: qty 1000

## 2020-03-07 NOTE — Patient Instructions (Signed)

## 2020-03-08 ENCOUNTER — Other Ambulatory Visit: Payer: Self-pay

## 2020-03-08 ENCOUNTER — Ambulatory Visit
Admission: RE | Admit: 2020-03-08 | Discharge: 2020-03-08 | Disposition: A | Discharge status: Home / Self Care | Payer: Self-pay | Source: Ambulatory Visit | Attending: Radiation Oncology | Admitting: Radiation Oncology

## 2020-03-08 MED ORDER — POTASSIUM CHLORIDE CRYS ER 20 MEQ PO TBCR: 20.0000 meq | EXTENDED_RELEASE_TABLET | Freq: Every day | ORAL | 0 refills | Status: DC

## 2020-03-08 MED FILL — POTASSIUM CHLORIDE CRYS ER: 20 | 30 days supply | Qty: 30 | Fill #0

## 2020-03-08 NOTE — Progress Notes (Signed)
Spoke with pt daughter, who called mom and confirmed lab result and potassium new order asked to use [facility]

## 2020-03-09 ENCOUNTER — Inpatient Hospital Stay (HOSPITAL_BASED_OUTPATIENT_CLINIC_OR_DEPARTMENT_OTHER): Payer: Medicaid Other | Admitting: Nurse Practitioner

## 2020-03-09 ENCOUNTER — Encounter: Payer: Self-pay | Admitting: Nurse Practitioner

## 2020-03-09 ENCOUNTER — Ambulatory Visit
Admission: RE | Admit: 2020-03-09 | Discharge: 2020-03-09 | Disposition: A | Discharge status: Home / Self Care | Payer: Medicaid Other | Source: Ambulatory Visit | Attending: Radiation Oncology | Admitting: Radiation Oncology

## 2020-03-09 ENCOUNTER — Ambulatory Visit: Payer: Self-pay | Admitting: Medical

## 2020-03-09 ENCOUNTER — Ambulatory Visit: Payer: Self-pay | Admitting: Nutrition

## 2020-03-09 ENCOUNTER — Other Ambulatory Visit: Payer: Self-pay

## 2020-03-09 ENCOUNTER — Inpatient Hospital Stay: Payer: Medicaid Other | Attending: Radiation Oncology

## 2020-03-09 ENCOUNTER — Inpatient Hospital Stay: Payer: Medicaid Other

## 2020-03-09 VITALS — BP 126/73 | HR 91 | Temp 98.7°F | Resp 16 | Ht 63.0 in | Wt 91.9 lb

## 2020-03-09 VITALS — BP 136/79 | HR 79 | Resp 16

## 2020-03-09 DIAGNOSIS — C154 Malignant neoplasm of middle third of esophagus: Secondary | ICD-10-CM

## 2020-03-09 DIAGNOSIS — C153 Malignant neoplasm of upper third of esophagus: Secondary | ICD-10-CM | POA: Insufficient documentation

## 2020-03-09 DIAGNOSIS — Z5111 Encounter for antineoplastic chemotherapy: Secondary | ICD-10-CM | POA: Insufficient documentation

## 2020-03-09 DIAGNOSIS — M549 Dorsalgia, unspecified: Secondary | ICD-10-CM

## 2020-03-09 DIAGNOSIS — R634 Abnormal weight loss: Secondary | ICD-10-CM | POA: Insufficient documentation

## 2020-03-09 LAB — CMP (CANCER CENTER ONLY)
ALT: 9 U/L (ref 0–44)
AST: 24 U/L (ref 15–41)
Albumin: 3 g/dL — ABNORMAL LOW (ref 3.5–5.0)
Alkaline Phosphatase: 50 U/L (ref 38–126)
Anion gap: 10 (ref 5–15)
BUN: 12 mg/dL (ref 8–23)
CO2: 22 mmol/L (ref 22–32)
Calcium: 8.9 mg/dL (ref 8.9–10.3)
Chloride: 108 mmol/L (ref 98–111)
Creatinine: 0.8 mg/dL (ref 0.44–1.00)
GFR, Estimated: >60 mL/min (ref >60)
Glucose, Bld: 84 mg/dL (ref 70–99)
Potassium: 3.2 mmol/L — ABNORMAL LOW (ref 3.5–5.1)
Sodium: 140 mmol/L (ref 135–145)
Total Bilirubin: 0.5 mg/dL (ref 0.3–1.2)
Total Protein: 7.5 g/dL (ref 6.5–8.1)

## 2020-03-09 LAB — CBC WITH DIFFERENTIAL (CANCER CENTER ONLY)
Abs Immature Granulocytes: 0.03 10*3/uL (ref 0.00–0.07)
Basophils Absolute: 0 10*3/uL (ref 0.0–0.1)
Basophils Relative: 0 %
Eosinophils Absolute: 0 10*3/uL (ref 0.0–0.5)
Eosinophils Relative: 1 %
HCT: 37.1 % (ref 36.0–46.0)
Hemoglobin: 12.2 g/dL (ref 12.0–15.0)
Immature Granulocytes: 1 %
Lymphocytes Relative: 30 %
Lymphs Abs: 0.7 10*3/uL (ref 0.7–4.0)
MCH: 29.5 pg (ref 26.0–34.0)
MCHC: 32.9 g/dL (ref 30.0–36.0)
MCV: 89.8 fL (ref 80.0–100.0)
Monocytes Absolute: 0.5 10*3/uL (ref 0.1–1.0)
Monocytes Relative: 18 %
Neutro Abs: 1.3 10*3/uL — ABNORMAL LOW (ref 1.7–7.7)
Neutrophils Relative %: 50 %
Platelet Count: 171 10*3/uL (ref 150–400)
RBC: 4.13 MIL/uL (ref 3.87–5.11)
RDW: 13.2 % (ref 11.5–15.5)
WBC Count: 2.5 10*3/uL — ABNORMAL LOW (ref 4.0–10.5)
nRBC: 0 % (ref 0.0–0.2)

## 2020-03-09 MED ORDER — FAMOTIDINE IN NACL 20-0.9 MG/50ML-% IV SOLN
20.0000 mg | Freq: Once | INTRAVENOUS | Status: AC
  Administered 2020-03-09: 20 mg via INTRAVENOUS

## 2020-03-09 MED ORDER — ACETAMINOPHEN 325 MG PO TABS
325.0000 mg | ORAL_TABLET | Freq: Once | ORAL | Status: AC
  Administered 2020-03-09: 325 mg via ORAL

## 2020-03-09 MED ORDER — SODIUM CHLORIDE 0.9 % IV SOLN
151.4000 mg | Freq: Once | INTRAVENOUS | Status: AC
  Administered 2020-03-09: 150 mg via INTRAVENOUS
  Filled 2020-03-09: qty 15

## 2020-03-09 MED ORDER — SODIUM CHLORIDE 0.9 % IV SOLN
10.0000 mg | Freq: Once | INTRAVENOUS | Status: AC
  Administered 2020-03-09: 10 mg via INTRAVENOUS
  Filled 2020-03-09: qty 1
  Filled 2020-03-09: qty 10

## 2020-03-09 MED ORDER — SODIUM CHLORIDE 0.9 % IV SOLN
40.0000 mg/m2 | Freq: Once | INTRAVENOUS | Status: AC
  Administered 2020-03-09: 54 mg via INTRAVENOUS
  Filled 2020-03-09: qty 9

## 2020-03-09 MED ORDER — FAMOTIDINE IN NACL 20-0.9 MG/50ML-% IV SOLN
20.0000 mg | Freq: Once | INTRAVENOUS | Status: AC | PRN
  Administered 2020-03-09: 20 mg via INTRAVENOUS

## 2020-03-09 MED ORDER — SODIUM CHLORIDE 0.9 % IV SOLN
Freq: Once | INTRAVENOUS | Status: DC | PRN
  Filled 2020-03-09: qty 250

## 2020-03-09 MED ORDER — DIPHENHYDRAMINE HCL 50 MG/ML IJ SOLN
INTRAMUSCULAR | Status: AC
  Filled 2020-03-09: qty 1

## 2020-03-09 MED ORDER — DIPHENHYDRAMINE HCL 50 MG/ML IJ SOLN
25.0000 mg | Freq: Once | INTRAMUSCULAR | Status: AC | PRN
  Administered 2020-03-09: 25 mg via INTRAVENOUS

## 2020-03-09 MED ORDER — ACETAMINOPHEN 325 MG PO TABS
ORAL_TABLET | ORAL | Status: AC
  Filled 2020-03-09: qty 1

## 2020-03-09 MED ORDER — MORPHINE SULFATE (PF) 2 MG/ML IV SOLN
INTRAVENOUS | Status: AC
  Filled 2020-03-09: qty 1

## 2020-03-09 MED ORDER — PALONOSETRON HCL INJECTION 0.25 MG/5ML
0.2500 mg | Freq: Once | INTRAVENOUS | Status: AC
  Administered 2020-03-09: 0.25 mg via INTRAVENOUS

## 2020-03-09 MED ORDER — DIPHENHYDRAMINE HCL 50 MG/ML IJ SOLN
25.0000 mg | Freq: Once | INTRAMUSCULAR | Status: AC
  Administered 2020-03-09: 25 mg via INTRAVENOUS

## 2020-03-09 MED ORDER — METHYLPREDNISOLONE SODIUM SUCC 125 MG IJ SOLR
125.0000 mg | Freq: Once | INTRAMUSCULAR | Status: AC | PRN
  Administered 2020-03-09: 60 mg via INTRAVENOUS

## 2020-03-09 MED ORDER — MORPHINE SULFATE (PF) 2 MG/ML IV SOLN
1.0000 mg | Freq: Once | INTRAVENOUS | Status: AC
  Administered 2020-03-09: 1 mg via INTRAVENOUS

## 2020-03-09 MED ORDER — FAMOTIDINE IN NACL 20-0.9 MG/50ML-% IV SOLN
INTRAVENOUS | Status: AC
  Filled 2020-03-09: qty 50

## 2020-03-09 MED ORDER — SODIUM CHLORIDE 0.9 % IV SOLN
Freq: Once | INTRAVENOUS | Status: AC
  Filled 2020-03-09: qty 250

## 2020-03-09 MED ORDER — PALONOSETRON HCL INJECTION 0.25 MG/5ML
INTRAVENOUS | Status: AC
  Filled 2020-03-09: qty 5

## 2020-03-09 MED FILL — NITROGLYCERIN 0.4 MG TAB SL: 0.4 | 8 days supply | Qty: 25 | Fill #0

## 2020-03-09 NOTE — Patient Instructions (Signed)
   [facility] Discharge Instructions for Patients Receiving Chemotherapy  Today you received the following chemotherapy agents Taxol and Carboplatin   To help prevent nausea and vomiting after your treatment, we encourage you to take your nausea medication as directed.    If you develop nausea and vomiting that is not controlled by your nausea medication, call the clinic.   BELOW ARE SYMPTOMS THAT SHOULD BE REPORTED IMMEDIATELY:  *FEVER GREATER THAN 100.5 F  *CHILLS WITH OR WITHOUT FEVER  NAUSEA AND VOMITING THAT IS NOT CONTROLLED WITH YOUR NAUSEA MEDICATION  *UNUSUAL SHORTNESS OF BREATH  *UNUSUAL BRUISING OR BLEEDING  TENDERNESS IN MOUTH AND THROAT WITH OR WITHOUT PRESENCE OF ULCERS  *URINARY PROBLEMS  *BOWEL PROBLEMS  UNUSUAL RASH Items with * indicate a potential emergency and should be followed up as soon as possible.  Feel free to call the clinic should you have any questions or concerns. The clinic phone number is [phone].  Please show the CHEMO ALERT CARD at check-in to the Emergency Department and triage nurse.   

## 2020-03-09 NOTE — Progress Notes (Signed)
  [facility] OFFICE PROGRESS NOTE   Diagnosis: Esophagus cancer  INTERVAL HISTORY:   Alexandria Buck returns for follow-up.  She continues radiation.  Chemotherapy was held 03/02/2020 due to neutropenia.  Dysphagia and odynophagia are better.  She estimates taking hydrocodone 2 times a day.  She is tolerating solids and liquids without difficulty.  She denies nausea/vomiting.  No constipation or diarrhea.  No numbness or tingling in the hands or feet.  No fever.  Objective:  Vital signs in last 24 hours:  Blood pressure 126/73, pulse 91, temperature 98.7 F (37.1 C), temperature source Tympanic, resp. rate 16, height 5\' 3"  (1.6 m), weight 91 lb 14.4 oz (41.7 kg), SpO2 100 %.    HEENT: No thrush or ulcers.  Mucous membranes appear moist. Resp: Lungs clear bilaterally. Cardio: Regular rate and rhythm. GI: No hepatosplenomegaly. Vascular: No leg edema.   Lab Results:  Lab Results  Component Value Date   WBC 2.5 (L) 03/09/2020   HGB 12.2 03/09/2020   HCT 37.1 03/09/2020   MCV 89.8 03/09/2020   PLT 171 03/09/2020   NEUTROABS 1.3 (L) 03/09/2020    Imaging:  No results found.  Medications: I have reviewed the patient's current medications.  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 Endoscopy 01/14/2020-partially obstructing mid esophagus mass ? PET scan 01/25/2020-hypermetabolic mid esophagus mass, mildly hypermetabolic right axillary lymph nodes ? Radiation 02/09/2020 ? Cycle 1 Taxol/carboplatin 02/10/2020 ? Cycle 2 Taxol/carboplatin 02/17/2020 ? Cycle 3 Taxol/carboplatin 02/24/2020 ? Chemotherapy held 03/02/2020 due to neutropenia ? Cycle 4 Taxol/carboplatin 03/09/2020 (Taxol dose reduced due to previous neutropenia) 2. Benign upper esophageal stricture, status post dilation 01/14/2020, 02/08/2020 3. H. pylori gastritis, treated with medical therapy October 2021 4. Cardiomyopathy 5. Bipolar disorder 6. Tobacco and alcohol  use 7. Hypertension 8. COVID-19 vaccination, last 01/05/2020 9. Urinary tract infection 02/16/2020 10. 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  She continues radiation.  She has completed 3 weekly treatments with Taxol/carboplatin.  Chemotherapy was held last week due to neutropenia.  We reviewed the CBC from today.  Counts are adequate to proceed with cycle 4 weekly Taxol/carboplatin today as scheduled.  Taxol will be dose reduced.  She will return for lab and the final cycle of weekly Taxol/carboplatin in 1 week.  We will see her in follow-up the week of 03/28/2020.  She will contact the office in the interim with any problems.  Plan reviewed with Dr. Benay Spice.    Ned Card ANP/GNP-BC   03/09/2020  9:37 AM

## 2020-03-09 NOTE — Progress Notes (Signed)
Nutrition follow-up completed with patient during infusion for esophageal cancer. Weight improved and documented as 91.9 pounds December 1, increased from 89.2 pounds November 24. Reports her dysphagia and odynophagia has improved. This has helped her to eat better. She is drinking boost +3 times a day. She denies nausea, vomiting, constipation, diarrhea. She declines the need for additional coupons.  Nutrition diagnosis: Unintentional weight loss improved.  Intervention: Continue high-calorie, high-protein foods. Continue Ensure plus or boost +3 times daily.  Monitoring, evaluation, goals: Patient will continue to increase calories and protein for weight maintenance/weight gain.  Next visit: To be scheduled as needed.  Patient has my contact information for questions or concerns.  **Disclaimer: This note was dictated with voice recognition software. Similar sounding words can inadvertently be transcribed and this note may contain transcription errors which may not have been corrected upon publication of note.**

## 2020-03-09 NOTE — Progress Notes (Signed)
Pt stable at d/c, tolerated rest of infusions well.  Ambulatory, steady gait, relative or transport services will be taking her home.

## 2020-03-09 NOTE — Progress Notes (Signed)
Soon after paclitaxel infusion initiated, patient began c/o severe back pain, facial flushing,and feeling of impending doom. Paclitaxel infusion stopped and hypersensitivity protocol initiated. Oxygen initiated at 2Lpm via [place]. Sandi Mealy, PA-C saw patient in infusion room. Medicated as documented in [facility]. After interventions, patient's condition rapidly improved to baseline. Report given to Ruben Im, RN.

## 2020-03-10 ENCOUNTER — Telehealth: Payer: Self-pay | Admitting: Nurse Practitioner

## 2020-03-10 ENCOUNTER — Ambulatory Visit
Admission: RE | Admit: 2020-03-10 | Discharge: 2020-03-10 | Disposition: A | Discharge status: Home / Self Care | Payer: Medicaid Other | Source: Ambulatory Visit | Attending: Radiation Oncology | Admitting: Radiation Oncology

## 2020-03-10 DIAGNOSIS — C154 Malignant neoplasm of middle third of esophagus: Secondary | ICD-10-CM | POA: Diagnosis not present

## 2020-03-10 NOTE — Telephone Encounter (Signed)
Scheduled appointments per 12/1 los. Spoke to [place] in Radiation who is moving radiation appointment to 8:00 the morning on 12/8. He will print her out an updated calendar when she comes in for her appointment tomorrow.

## 2020-03-11 ENCOUNTER — Ambulatory Visit
Admission: RE | Admit: 2020-03-11 | Discharge: 2020-03-11 | Disposition: A | Discharge status: Home / Self Care | Payer: Medicaid Other | Source: Ambulatory Visit | Attending: Radiation Oncology | Admitting: Radiation Oncology

## 2020-03-11 DIAGNOSIS — C154 Malignant neoplasm of middle third of esophagus: Secondary | ICD-10-CM | POA: Diagnosis not present

## 2020-03-11 NOTE — Progress Notes (Signed)
    DATE:  03/29/2020                                          X  CHEMO/IMMUNOTHERAPY REACTION            MD:  Dr. Dominica Severin B. Sherrill   AGENT/BLOOD PRODUCT RECEIVING TODAY:               Carboplatin and paclitaxel   AGENT/BLOOD PRODUCT RECEIVING IMMEDIATELY PRIOR TO REACTION:           Paclitaxel (after 10 minutes of infusion)   VS: BP:      181/111   P:        92       SPO2:        100% on room air                BP:      158/93   P:        98       SPO2:        100% on room air     REACTION(S):            Back pain, shortness of breath, and facial erythema   PREMEDS:       Aloxi, Benadryl, dexamethasone, and Pepcid   INTERVENTION: Paclitaxel was paused and the patient was given Benadryl 25 mg IV, Pepcid 20 mg IV, morphine sulfate 1 mg IV, and Solu-Medrol 60 mg IV   Review of Systems  Review of Systems  Constitutional: Negative for chills, diaphoresis and fever.  HENT: Negative for trouble swallowing and voice change.   Respiratory: Positive for shortness of breath. Negative for cough, chest tightness and wheezing.   Cardiovascular: Negative for chest pain and palpitations.  Gastrointestinal: Negative for abdominal pain, constipation, diarrhea, nausea and vomiting.  Musculoskeletal: Positive for back pain. Negative for myalgias.  Skin:       Facial erythema  Neurological: Negative for dizziness, light-headedness and headaches.     Physical Exam  Physical Exam Constitutional:      General: She is not in acute distress.    Appearance: She is not diaphoretic.  HENT:     Head: Normocephalic and atraumatic.  Cardiovascular:     Rate and Rhythm: Normal rate and regular rhythm.     Heart sounds: Normal heart sounds. No murmur heard.  No friction rub. No gallop.   Pulmonary:     Effort: Pulmonary effort is normal. No respiratory distress.     Breath sounds: Normal breath sounds. No wheezing or rales.  Skin:    General: Skin is warm and dry.     Findings: No erythema or rash.   Neurological:     Mental Status: She is alert.     OUTCOME:                 The patient's symptoms abated after the above intervention.  She was able to restart and complete paclitaxel.  She then completed carboplatin without any issues of concern.   Sandi Mealy, MHS, PA-C  This case was discussed with Dr. Benay Spice. He expressed agreement with my management of this patient.

## 2020-03-12 DIAGNOSIS — C154 Malignant neoplasm of middle third of esophagus: Secondary | ICD-10-CM | POA: Diagnosis not present

## 2020-03-14 ENCOUNTER — Ambulatory Visit
Admission: RE | Admit: 2020-03-14 | Discharge: 2020-03-14 | Disposition: A | Discharge status: Home / Self Care | Payer: Medicaid Other | Source: Ambulatory Visit | Attending: Radiation Oncology | Admitting: Radiation Oncology

## 2020-03-14 DIAGNOSIS — C154 Malignant neoplasm of middle third of esophagus: Secondary | ICD-10-CM | POA: Diagnosis not present

## 2020-03-15 ENCOUNTER — Ambulatory Visit
Admission: RE | Admit: 2020-03-15 | Discharge: 2020-03-15 | Disposition: A | Discharge status: Home / Self Care | Payer: Medicaid Other | Source: Ambulatory Visit | Attending: Radiation Oncology | Admitting: Radiation Oncology

## 2020-03-15 DIAGNOSIS — C154 Malignant neoplasm of middle third of esophagus: Secondary | ICD-10-CM | POA: Diagnosis not present

## 2020-03-16 ENCOUNTER — Other Ambulatory Visit: Payer: Self-pay | Admitting: Oncology

## 2020-03-16 ENCOUNTER — Inpatient Hospital Stay: Payer: Medicaid Other

## 2020-03-16 ENCOUNTER — Ambulatory Visit
Admission: RE | Admit: 2020-03-16 | Discharge: 2020-03-16 | Disposition: A | Discharge status: Home / Self Care | Payer: Medicaid Other | Source: Ambulatory Visit | Attending: Radiation Oncology | Admitting: Radiation Oncology

## 2020-03-16 ENCOUNTER — Other Ambulatory Visit: Payer: Self-pay

## 2020-03-16 VITALS — BP 99/71 | HR 92 | Temp 98.2°F | Resp 18 | Wt 89.2 lb

## 2020-03-16 DIAGNOSIS — C154 Malignant neoplasm of middle third of esophagus: Secondary | ICD-10-CM

## 2020-03-16 LAB — CMP (CANCER CENTER ONLY)
ALT: 12 U/L (ref 0–44)
AST: 30 U/L (ref 15–41)
Albumin: 3.3 g/dL — ABNORMAL LOW (ref 3.5–5.0)
Alkaline Phosphatase: 49 U/L (ref 38–126)
Anion gap: 10 (ref 5–15)
BUN: 12 mg/dL (ref 8–23)
CO2: 25 mmol/L (ref 22–32)
Calcium: 9.3 mg/dL (ref 8.9–10.3)
Chloride: 102 mmol/L (ref 98–111)
Creatinine: 1 mg/dL (ref 0.44–1.00)
GFR, Estimated: >60 mL/min (ref >60)
Glucose, Bld: 92 mg/dL (ref 70–99)
Potassium: 4.1 mmol/L (ref 3.5–5.1)
Sodium: 137 mmol/L (ref 135–145)
Total Bilirubin: 0.7 mg/dL (ref 0.3–1.2)
Total Protein: 8.4 g/dL — ABNORMAL HIGH (ref 6.5–8.1)

## 2020-03-16 LAB — CBC WITH DIFFERENTIAL (CANCER CENTER ONLY)
Abs Immature Granulocytes: 0.02 10*3/uL (ref 0.00–0.07)
Basophils Absolute: 0 10*3/uL (ref 0.0–0.1)
Basophils Relative: 0 %
Eosinophils Absolute: 0 10*3/uL (ref 0.0–0.5)
Eosinophils Relative: 0 %
HCT: 38.6 % (ref 36.0–46.0)
Hemoglobin: 12.7 g/dL (ref 12.0–15.0)
Immature Granulocytes: 1 %
Lymphocytes Relative: 20 %
Lymphs Abs: 0.5 10*3/uL — ABNORMAL LOW (ref 0.7–4.0)
MCH: 29.3 pg (ref 26.0–34.0)
MCHC: 32.9 g/dL (ref 30.0–36.0)
MCV: 88.9 fL (ref 80.0–100.0)
Monocytes Absolute: 0.3 10*3/uL (ref 0.1–1.0)
Monocytes Relative: 13 %
Neutro Abs: 1.5 10*3/uL — ABNORMAL LOW (ref 1.7–7.7)
Neutrophils Relative %: 66 %
Platelet Count: 138 10*3/uL — ABNORMAL LOW (ref 150–400)
RBC: 4.34 MIL/uL (ref 3.87–5.11)
RDW: 13.1 % (ref 11.5–15.5)
WBC Count: 2.2 10*3/uL — ABNORMAL LOW (ref 4.0–10.5)
nRBC: 0 % (ref 0.0–0.2)

## 2020-03-16 MED ORDER — FAMOTIDINE IN NACL 20-0.9 MG/50ML-% IV SOLN
INTRAVENOUS | Status: AC
  Filled 2020-03-16: qty 50

## 2020-03-16 MED ORDER — FAMOTIDINE IN NACL 20-0.9 MG/50ML-% IV SOLN
20.0000 mg | Freq: Once | INTRAVENOUS | Status: AC
  Administered 2020-03-16: 20 mg via INTRAVENOUS

## 2020-03-16 MED ORDER — PALONOSETRON HCL INJECTION 0.25 MG/5ML
INTRAVENOUS | Status: AC
  Filled 2020-03-16: qty 5

## 2020-03-16 MED ORDER — DIPHENHYDRAMINE HCL 50 MG/ML IJ SOLN
INTRAMUSCULAR | Status: AC
  Filled 2020-03-16: qty 1

## 2020-03-16 MED ORDER — SODIUM CHLORIDE 0.9 % IV SOLN
10.0000 mg | Freq: Once | INTRAVENOUS | Status: AC
  Administered 2020-03-16: 10 mg via INTRAVENOUS
  Filled 2020-03-16: qty 10

## 2020-03-16 MED ORDER — PALONOSETRON HCL INJECTION 0.25 MG/5ML
0.2500 mg | Freq: Once | INTRAVENOUS | Status: AC
  Administered 2020-03-16: 0.25 mg via INTRAVENOUS

## 2020-03-16 MED ORDER — SODIUM CHLORIDE 0.9 % IV SOLN
40.0000 mg/m2 | Freq: Once | INTRAVENOUS | Status: AC
  Administered 2020-03-16: 54 mg via INTRAVENOUS
  Filled 2020-03-16: qty 9

## 2020-03-16 MED ORDER — SODIUM CHLORIDE 0.9 % IV SOLN
Freq: Once | INTRAVENOUS | Status: AC
  Filled 2020-03-16: qty 250

## 2020-03-16 MED ORDER — DIPHENHYDRAMINE HCL 50 MG/ML IJ SOLN
25.0000 mg | Freq: Once | INTRAMUSCULAR | Status: AC
  Administered 2020-03-16: 25 mg via INTRAVENOUS

## 2020-03-16 MED ORDER — SODIUM CHLORIDE 0.9 % IV SOLN
131.2000 mg | Freq: Once | INTRAVENOUS | Status: AC
  Administered 2020-03-16: 130 mg via INTRAVENOUS
  Filled 2020-03-16: qty 13

## 2020-03-16 NOTE — Progress Notes (Signed)
Patient completed infusion without incident. In no visible distress at time of discharge. Ambulated out of cancer center. AVS provided.  

## 2020-03-16 NOTE — Patient Instructions (Signed)
[facility] Discharge Instructions for Patients Receiving Chemotherapy  Today you received the following chemotherapy agents: paclitaxel/carboplatin.  To help prevent nausea and vomiting after your treatment, we encourage you to take your nausea medication as directed.  If you develop nausea and vomiting that is not controlled by your nausea medication, call the clinic.   BELOW ARE SYMPTOMS THAT SHOULD BE REPORTED IMMEDIATELY:  *FEVER GREATER THAN 100.5 F  *CHILLS WITH OR WITHOUT FEVER  NAUSEA AND VOMITING THAT IS NOT CONTROLLED WITH YOUR NAUSEA MEDICATION  *UNUSUAL SHORTNESS OF BREATH  *UNUSUAL BRUISING OR BLEEDING  TENDERNESS IN MOUTH AND THROAT WITH OR WITHOUT PRESENCE OF ULCERS  *URINARY PROBLEMS  *BOWEL PROBLEMS  UNUSUAL RASH Items with * indicate a potential emergency and should be followed up as soon as possible.  Feel free to call the clinic should you have any questions or concerns. The clinic phone number is [phone].  Please show the CHEMO ALERT CARD at check-in to the Emergency Department and triage nurse.   

## 2020-03-17 ENCOUNTER — Other Ambulatory Visit: Payer: Self-pay

## 2020-03-17 ENCOUNTER — Ambulatory Visit
Admission: RE | Admit: 2020-03-17 | Discharge: 2020-03-17 | Disposition: A | Discharge status: Home / Self Care | Payer: Medicaid Other | Source: Ambulatory Visit | Attending: Radiation Oncology | Admitting: Radiation Oncology

## 2020-03-17 DIAGNOSIS — C154 Malignant neoplasm of middle third of esophagus: Secondary | ICD-10-CM | POA: Diagnosis not present

## 2020-03-18 ENCOUNTER — Ambulatory Visit
Admission: RE | Admit: 2020-03-18 | Discharge: 2020-03-18 | Disposition: A | Discharge status: Home / Self Care | Payer: Medicaid Other | Source: Ambulatory Visit | Attending: Radiation Oncology | Admitting: Radiation Oncology

## 2020-03-18 DIAGNOSIS — C154 Malignant neoplasm of middle third of esophagus: Secondary | ICD-10-CM | POA: Diagnosis not present

## 2020-03-20 ENCOUNTER — Emergency Department (HOSPITAL_COMMUNITY)
Admission: EM | Admit: 2020-03-20 | Discharge: 2020-03-20 | Disposition: A | Discharge status: Home / Self Care | Payer: Medicaid Other | Attending: Emergency Medicine | Admitting: Emergency Medicine

## 2020-03-20 ENCOUNTER — Emergency Department (HOSPITAL_COMMUNITY): Payer: Medicaid Other

## 2020-03-20 ENCOUNTER — Other Ambulatory Visit: Payer: Self-pay

## 2020-03-20 ENCOUNTER — Encounter (HOSPITAL_COMMUNITY): Payer: Self-pay

## 2020-03-20 DIAGNOSIS — I5021 Acute systolic (congestive) heart failure: Secondary | ICD-10-CM | POA: Insufficient documentation

## 2020-03-20 DIAGNOSIS — Z79899 Other long term (current) drug therapy: Secondary | ICD-10-CM | POA: Diagnosis not present

## 2020-03-20 DIAGNOSIS — I110 Hypertensive heart disease with heart failure: Secondary | ICD-10-CM | POA: Diagnosis not present

## 2020-03-20 DIAGNOSIS — Z87891 Personal history of nicotine dependence: Secondary | ICD-10-CM | POA: Insufficient documentation

## 2020-03-20 DIAGNOSIS — R079 Chest pain, unspecified: Secondary | ICD-10-CM | POA: Diagnosis present

## 2020-03-20 LAB — CBC
HCT: 33.3 % — ABNORMAL LOW (ref 36.0–46.0)
Hemoglobin: 11.1 g/dL — ABNORMAL LOW (ref 12.0–15.0)
MCH: 29.8 pg (ref 26.0–34.0)
MCHC: 33.3 g/dL (ref 30.0–36.0)
MCV: 89.5 fL (ref 80.0–100.0)
Platelets: 138 10*3/uL — ABNORMAL LOW (ref 150–400)
RBC: 3.72 MIL/uL — ABNORMAL LOW (ref 3.87–5.11)
RDW: 13.1 % (ref 11.5–15.5)
WBC: 1.9 10*3/uL — ABNORMAL LOW (ref 4.0–10.5)
nRBC: 0 % (ref 0.0–0.2)

## 2020-03-20 LAB — BASIC METABOLIC PANEL
Anion gap: 12 (ref 5–15)
BUN: 14 mg/dL (ref 8–23)
CO2: 24 mmol/L (ref 22–32)
Calcium: 9.2 mg/dL (ref 8.9–10.3)
Chloride: 104 mmol/L (ref 98–111)
Creatinine, Ser: 0.95 mg/dL (ref 0.44–1.00)
GFR, Estimated: >60 mL/min (ref >60)
Glucose, Bld: 106 mg/dL — ABNORMAL HIGH (ref 70–99)
Potassium: 3.6 mmol/L (ref 3.5–5.1)
Sodium: 140 mmol/L (ref 135–145)

## 2020-03-20 LAB — D-DIMER, QUANTITATIVE: D-Dimer, Quant: 0.7 ug/mL-FEU — ABNORMAL HIGH (ref 0.00–0.50)

## 2020-03-20 LAB — TROPONIN I (HIGH SENSITIVITY)
Troponin I (High Sensitivity): 18 ng/L — ABNORMAL HIGH (ref <18)
Troponin I (High Sensitivity): 15 ng/L (ref <18)

## 2020-03-20 LAB — BRAIN NATRIURETIC PEPTIDE: B Natriuretic Peptide: 50 pg/mL (ref 0.0–100.0)

## 2020-03-20 MED ORDER — ONDANSETRON HCL 4 MG/2ML IJ SOLN
INTRAMUSCULAR | Status: AC
  Administered 2020-03-20: 15:00:00 4 mg
  Filled 2020-03-20: qty 2

## 2020-03-20 MED ORDER — ALUM & MAG HYDROXIDE-SIMETH 400-400-40 MG/5ML PO SUSP: 5.0000 mL | Freq: Four times a day (QID) | ORAL | 0 refills | Status: DC | PRN

## 2020-03-20 MED ORDER — IOHEXOL 350 MG/ML SOLN
75.0000 mL | Freq: Once | INTRAVENOUS | Status: AC | PRN
  Administered 2020-03-20: 75 mL via INTRAVENOUS

## 2020-03-20 MED ORDER — LIDOCAINE VISCOUS HCL 2 % MT SOLN
15.0000 mL | Freq: Once | OROMUCOSAL | Status: AC
  Administered 2020-03-20: 17:00:00 15 mL via ORAL
  Filled 2020-03-20: qty 15

## 2020-03-20 MED ORDER — ONDANSETRON HCL 4 MG/2ML IJ SOLN: 4.0000 mg | Freq: Once | INTRAMUSCULAR | Status: AC

## 2020-03-20 MED ORDER — FENTANYL CITRATE (PF) 100 MCG/2ML IJ SOLN
50.0000 ug | Freq: Once | INTRAMUSCULAR | Status: AC
  Administered 2020-03-20: 19:00:00 50 ug via INTRAVENOUS
  Filled 2020-03-20: qty 2

## 2020-03-20 MED ORDER — FENTANYL CITRATE (PF) 100 MCG/2ML IJ SOLN
50.0000 ug | Freq: Once | INTRAMUSCULAR | Status: AC
  Administered 2020-03-20: 15:00:00 50 ug via INTRAVENOUS
  Filled 2020-03-20: qty 2

## 2020-03-20 MED ORDER — ALUM & MAG HYDROXIDE-SIMETH 200-200-20 MG/5ML PO SUSP
30.0000 mL | Freq: Once | ORAL | Status: AC
  Administered 2020-03-20: 17:00:00 30 mL via ORAL
  Filled 2020-03-20: qty 30
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
## 2020-03-20 NOTE — ED Notes (Signed)
Patient transported to X-ray 

## 2020-03-20 NOTE — ED Notes (Signed)
Patient transported to CT 

## 2020-03-20 NOTE — Discharge Instructions (Addendum)
Follow-up with your cancer doctor and your primary doctor.  Return if you have any worsening chest pain, difficulty breathing or other new concerns.

## 2020-03-20 NOTE — ED Triage Notes (Signed)
Patient arrived by [facility] complaining of chest pain x 1 day. Pain worse with inspiration and movement. Patient alert and oriented. Received asa and 1 sl ntg prior to arrival. Patient currently receiving chemo and radiation for esophageal cancer. Last treatment Wednesday per patient. NAD

## 2020-03-21 ENCOUNTER — Ambulatory Visit
Admission: RE | Admit: 2020-03-21 | Discharge: 2020-03-21 | Disposition: A | Discharge status: Home / Self Care | Payer: Medicaid Other | Source: Ambulatory Visit | Attending: Radiation Oncology | Admitting: Radiation Oncology

## 2020-03-21 DIAGNOSIS — C154 Malignant neoplasm of middle third of esophagus: Secondary | ICD-10-CM | POA: Diagnosis not present

## 2020-03-22 ENCOUNTER — Encounter: Payer: Self-pay | Admitting: Radiation Oncology

## 2020-03-22 ENCOUNTER — Ambulatory Visit
Admission: RE | Admit: 2020-03-22 | Discharge: 2020-03-22 | Disposition: A | Discharge status: Home / Self Care | Payer: Medicaid Other | Source: Ambulatory Visit | Attending: Radiation Oncology | Admitting: Radiation Oncology

## 2020-03-22 DIAGNOSIS — C154 Malignant neoplasm of middle third of esophagus: Secondary | ICD-10-CM | POA: Diagnosis not present

## 2020-03-29 ENCOUNTER — Other Ambulatory Visit: Payer: Self-pay | Admitting: *Deleted

## 2020-03-29 ENCOUNTER — Other Ambulatory Visit: Payer: Self-pay

## 2020-03-29 ENCOUNTER — Inpatient Hospital Stay (HOSPITAL_BASED_OUTPATIENT_CLINIC_OR_DEPARTMENT_OTHER): Payer: Medicaid Other | Admitting: Oncology

## 2020-03-29 ENCOUNTER — Telehealth: Payer: Self-pay | Admitting: *Deleted

## 2020-03-29 ENCOUNTER — Inpatient Hospital Stay: Payer: Medicaid Other

## 2020-03-29 VITALS — BP 107/87 | HR 110 | Temp 94.1°F | Resp 15 | Ht 63.0 in | Wt 76.2 lb

## 2020-03-29 DIAGNOSIS — R634 Abnormal weight loss: Secondary | ICD-10-CM | POA: Diagnosis not present

## 2020-03-29 DIAGNOSIS — Z5111 Encounter for antineoplastic chemotherapy: Secondary | ICD-10-CM | POA: Diagnosis present

## 2020-03-29 DIAGNOSIS — C154 Malignant neoplasm of middle third of esophagus: Secondary | ICD-10-CM

## 2020-03-29 DIAGNOSIS — C153 Malignant neoplasm of upper third of esophagus: Secondary | ICD-10-CM | POA: Diagnosis present

## 2020-03-29 LAB — CMP (CANCER CENTER ONLY)
ALT: 13 U/L (ref 0–44)
AST: 26 U/L (ref 15–41)
Albumin: 3.4 g/dL — ABNORMAL LOW (ref 3.5–5.0)
Alkaline Phosphatase: 46 U/L (ref 38–126)
Anion gap: 11 (ref 5–15)
BUN: 28 mg/dL — ABNORMAL HIGH (ref 8–23)
CO2: 28 mmol/L (ref 22–32)
Calcium: 9.6 mg/dL (ref 8.9–10.3)
Chloride: 100 mmol/L (ref 98–111)
Creatinine: 1.46 mg/dL — ABNORMAL HIGH (ref 0.44–1.00)
GFR, Estimated: 41 mL/min — ABNORMAL LOW (ref >60)
Glucose, Bld: 108 mg/dL — ABNORMAL HIGH (ref 70–99)
Potassium: 3.8 mmol/L (ref 3.5–5.1)
Sodium: 139 mmol/L (ref 135–145)
Total Bilirubin: 0.7 mg/dL (ref 0.3–1.2)
Total Protein: 9.5 g/dL — ABNORMAL HIGH (ref 6.5–8.1)

## 2020-03-29 LAB — CBC WITH DIFFERENTIAL (CANCER CENTER ONLY)
Abs Immature Granulocytes: 0.03 10*3/uL (ref 0.00–0.07)
Basophils Absolute: 0 10*3/uL (ref 0.0–0.1)
Basophils Relative: 0 %
Eosinophils Absolute: 0 10*3/uL (ref 0.0–0.5)
Eosinophils Relative: 0 %
HCT: 38.9 % (ref 36.0–46.0)
Hemoglobin: 12.8 g/dL (ref 12.0–15.0)
Immature Granulocytes: 1 %
Lymphocytes Relative: 29 %
Lymphs Abs: 0.9 10*3/uL (ref 0.7–4.0)
MCH: 29.2 pg (ref 26.0–34.0)
MCHC: 32.9 g/dL (ref 30.0–36.0)
MCV: 88.6 fL (ref 80.0–100.0)
Monocytes Absolute: 0.4 10*3/uL (ref 0.1–1.0)
Monocytes Relative: 14 %
Neutro Abs: 1.8 10*3/uL (ref 1.7–7.7)
Neutrophils Relative %: 56 %
Platelet Count: 180 10*3/uL (ref 150–400)
RBC: 4.39 MIL/uL (ref 3.87–5.11)
RDW: 14.2 % (ref 11.5–15.5)
WBC Count: 3.2 10*3/uL — ABNORMAL LOW (ref 4.0–10.5)
nRBC: 0 % (ref 0.0–0.2)

## 2020-03-29 MED ORDER — SUCRALFATE 1 G PO TABS: 1.0000 g | ORAL_TABLET | Freq: Four times a day (QID) | ORAL | 0 refills | Status: DC

## 2020-03-29 MED ORDER — SUCRALFATE 1 GM/10ML PO SUSP: 1.0000 g | Freq: Three times a day (TID) | ORAL | 1 refills | Status: DC

## 2020-03-29 MED FILL — SUCRALFATE 1 GM TAB: 1 | 30 days supply | Qty: 120 | Fill #0

## 2020-03-29 NOTE — Telephone Encounter (Signed)
Left VM for patient w/0800 infusion tomorrow and encouraged her to be on time--infusion schedule is very tight. Called her daughter, Aniceto Boss and gave her the appointment. She will alert her about the appointment tonight.

## 2020-03-29 NOTE — Telephone Encounter (Signed)
Left message that she would like to follow MD advice and receive IV fluids tomorrow. High priority scheduling message sent.

## 2020-03-29 NOTE — Progress Notes (Signed)
[facility] OFFICE PROGRESS NOTE   Diagnosis: Esophagus cancer  INTERVAL HISTORY:   [name] returns as scheduled.  She completed a final cycle of Taxol/carboplatin on 03/16/2020 and radiation was completed on 03/22/2020. She reports pain with swallowing. The pain is at the anterior chest and back. She was seen in the emergency room with chest pain on 03/20/2020. The pain was felt to be noncardiac. She was treated with Maalox and fentanyl.  [name] reports drinking adequate fluids. She has been drinking 3 boost supplements daily for the past week. She had an episode of "dizziness "when getting out of the shower this morning. No other dizziness.  Objective:  Vital signs in last 24 hours:  Blood pressure 107/87, pulse (!) 110, temperature (!) 94.1 F (34.5 C), resp. rate 15, height 5\' 3"  (1.6 m), weight 76 lb 3.2 oz (34.6 kg), SpO2 100 %.    HEENT: The mucous membranes are moist, no thrush Resp: Distant breath sounds, clear bilaterally, no respiratory distress Cardio: Regular rate and rhythm GI: No hepatosplenomegaly, no mass, nontender Vascular: No leg edema Neuro: Alert and oriented Skin: Decreased skin turgor, hyperpigmentation with superficial desquamation at the mid upper back  Portacath/PICC-without erythema  Lab Results:  Lab Results  Component Value Date   WBC 3.2 (L) 03/29/2020   HGB 12.8 03/29/2020   HCT 38.9 03/29/2020   MCV 88.6 03/29/2020   PLT 180 03/29/2020   NEUTROABS 1.8 03/29/2020    CMP  Lab Results  Component Value Date   NA 139 03/29/2020   K 3.8 03/29/2020   CL 100 03/29/2020   CO2 28 03/29/2020   GLUCOSE 108 (H) 03/29/2020   BUN 28 (H) 03/29/2020   CREATININE 1.46 (H) 03/29/2020   CALCIUM 9.6 03/29/2020   PROT 9.5 (H) 03/29/2020   ALBUMIN 3.4 (L) 03/29/2020   AST 26 03/29/2020   ALT 13 03/29/2020   ALKPHOS 46 03/29/2020   BILITOT 0.7 03/29/2020   GFRNONAA 41 (L) 03/29/2020   GFRAA 87 12/31/2019    Medications:  I have reviewed the patient's current medications.   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 Endoscopy 01/14/2020-partially obstructing mid esophagus mass ? PET scan 01/25/2020-hypermetabolic mid esophagus mass, mildly hypermetabolic right axillary lymph nodes ? Radiation 02/09/2020-03/22/2020 ? Cycle 1 Taxol/carboplatin 02/10/2020 ? Cycle 2 Taxol/carboplatin 02/17/2020 ? Cycle 3 Taxol/carboplatin 02/24/2020 ? Chemotherapy held 03/02/2020 due to neutropenia ? Cycle 4 Taxol/carboplatin 03/09/2020 (Taxol dose reduced due to previous neutropenia) ? Cycle 5 Taxol/carboplatin 03/16/2020 2. Benign upper esophageal stricture, status post dilation 01/14/2020, 02/08/2020 3. H. pylori gastritis, treated with medical therapy October 2021 4. Cardiomyopathy 5. Bipolar disorder 6. Tobacco and alcohol use 7. Hypertension 8. COVID-19 vaccination, last 01/05/2020 9. Urinary tract infection 02/16/2020 10. Odynophagia secondary to radiation    Disposition: [name] has completed the course of chemotherapy and radiation. She has developed increased odynophagia. She will start Carafate today. She will use hydrocodone as needed for pain.  [name] has lost a significant amount of weight over the past few weeks. She appears dehydrated today. I recommended IV fluids and reassessment within the next few days. She declines IV fluids and a follow-up visit until after the Christmas holiday.  I encouraged her to increase her fluid intake and use of boost. She will continue mechanical soft diet as tolerated. She agrees to a follow-up appointment on 04/05/2020. We will check on her by telephone tomorrow.  She will hold furosemide and spironolactone.  Betsy Coder,  MD  03/29/2020  9:39 AM

## 2020-03-30 ENCOUNTER — Other Ambulatory Visit: Payer: Self-pay

## 2020-03-30 ENCOUNTER — Telehealth: Payer: Self-pay | Admitting: Oncology

## 2020-03-30 ENCOUNTER — Inpatient Hospital Stay: Payer: Medicaid Other

## 2020-03-30 ENCOUNTER — Telehealth: Payer: Self-pay | Admitting: *Deleted

## 2020-03-30 ENCOUNTER — Other Ambulatory Visit: Payer: Self-pay | Admitting: Nurse Practitioner

## 2020-03-30 DIAGNOSIS — Z5111 Encounter for antineoplastic chemotherapy: Secondary | ICD-10-CM | POA: Diagnosis not present

## 2020-03-30 DIAGNOSIS — C154 Malignant neoplasm of middle third of esophagus: Secondary | ICD-10-CM

## 2020-03-30 MED ORDER — HYDROCODONE-ACETAMINOPHEN 7.5-325 MG/15ML PO SOLN: 10.0000 mL | Freq: Four times a day (QID) | ORAL | 0 refills | Status: DC | PRN

## 2020-03-30 MED ORDER — SODIUM CHLORIDE 0.9 % IV SOLN
INTRAVENOUS | Status: AC
  Filled 2020-03-30 (×2): qty 250

## 2020-03-30 MED FILL — HYDROCOD-APAP 7.5-325/15ML: 7.5-325 | 4 days supply | Qty: 240 | Fill #0

## 2020-03-30 NOTE — Patient Instructions (Signed)

## 2020-03-30 NOTE — Telephone Encounter (Signed)
Left VM requesting refill on her hydrocodone-apap liquid. MD notified.

## 2020-03-30 NOTE — Telephone Encounter (Signed)
Scheduled appointment per 12/21 los. Called patient, no answer. Left message with appointment date and time.

## 2020-04-05 ENCOUNTER — Inpatient Hospital Stay: Payer: Medicaid Other

## 2020-04-05 ENCOUNTER — Telehealth: Payer: Self-pay | Admitting: *Deleted

## 2020-04-05 ENCOUNTER — Inpatient Hospital Stay: Payer: Medicaid Other | Admitting: Nurse Practitioner

## 2020-04-05 ENCOUNTER — Other Ambulatory Visit: Payer: Self-pay | Admitting: Nurse Practitioner

## 2020-04-05 DIAGNOSIS — C154 Malignant neoplasm of middle third of esophagus: Secondary | ICD-10-CM

## 2020-04-05 MED ORDER — HYDROCODONE-ACETAMINOPHEN 7.5-325 MG/15ML PO SOLN: 10.0000 mL | Freq: Two times a day (BID) | ORAL | 0 refills | Status: DC | PRN

## 2020-04-05 MED FILL — HYDROCOD-APAP 7.5-325/15ML: 7.5-325 | 10 days supply | Qty: 200 | Fill #0

## 2020-04-05 NOTE — Telephone Encounter (Signed)
Called to request refill on her Hycet pain med, saying "I'm out". Inquired why she missed her appointment today and states her dog was sick and had to be taken to the vet. She agreed to reschedule for 04/06/20 at 2:15 pm.

## 2020-04-06 ENCOUNTER — Other Ambulatory Visit: Payer: Self-pay

## 2020-04-06 ENCOUNTER — Inpatient Hospital Stay: Payer: Medicaid Other

## 2020-04-06 ENCOUNTER — Inpatient Hospital Stay (HOSPITAL_BASED_OUTPATIENT_CLINIC_OR_DEPARTMENT_OTHER): Payer: Medicaid Other | Admitting: Nurse Practitioner

## 2020-04-06 ENCOUNTER — Telehealth: Payer: Self-pay | Admitting: *Deleted

## 2020-04-06 ENCOUNTER — Encounter: Payer: Self-pay | Admitting: Nurse Practitioner

## 2020-04-06 VITALS — BP 105/63 | HR 88 | Temp 99.1°F | Resp 18 | Ht 63.0 in | Wt 84.5 lb

## 2020-04-06 DIAGNOSIS — Z5111 Encounter for antineoplastic chemotherapy: Secondary | ICD-10-CM | POA: Diagnosis not present

## 2020-04-06 DIAGNOSIS — C154 Malignant neoplasm of middle third of esophagus: Secondary | ICD-10-CM

## 2020-04-06 LAB — CMP (CANCER CENTER ONLY)
ALT: 12 U/L (ref 0–44)
AST: 24 U/L (ref 15–41)
Albumin: 2.7 g/dL — ABNORMAL LOW (ref 3.5–5.0)
Alkaline Phosphatase: 44 U/L (ref 38–126)
Anion gap: 5 (ref 5–15)
BUN: 13 mg/dL (ref 8–23)
CO2: 29 mmol/L (ref 22–32)
Calcium: 8 mg/dL — ABNORMAL LOW (ref 8.9–10.3)
Chloride: 100 mmol/L (ref 98–111)
Creatinine: 0.91 mg/dL (ref 0.44–1.00)
GFR, Estimated: >60 mL/min (ref >60)
Glucose, Bld: 89 mg/dL (ref 70–99)
Potassium: 2.7 mmol/L — CL (ref 3.5–5.1)
Sodium: 134 mmol/L — ABNORMAL LOW (ref 135–145)
Total Bilirubin: 0.3 mg/dL (ref 0.3–1.2)
Total Protein: 7.1 g/dL (ref 6.5–8.1)

## 2020-04-06 LAB — CBC WITH DIFFERENTIAL (CANCER CENTER ONLY)
Abs Immature Granulocytes: 0.03 10*3/uL (ref 0.00–0.07)
Basophils Absolute: 0 10*3/uL (ref 0.0–0.1)
Basophils Relative: 0 %
Eosinophils Absolute: 0 10*3/uL (ref 0.0–0.5)
Eosinophils Relative: 0 %
HCT: 28.5 % — ABNORMAL LOW (ref 36.0–46.0)
Hemoglobin: 9.5 g/dL — ABNORMAL LOW (ref 12.0–15.0)
Immature Granulocytes: 1 %
Lymphocytes Relative: 33 %
Lymphs Abs: 1 10*3/uL (ref 0.7–4.0)
MCH: 29.3 pg (ref 26.0–34.0)
MCHC: 33.3 g/dL (ref 30.0–36.0)
MCV: 88 fL (ref 80.0–100.0)
Monocytes Absolute: 0.4 10*3/uL (ref 0.1–1.0)
Monocytes Relative: 14 %
Neutro Abs: 1.5 10*3/uL — ABNORMAL LOW (ref 1.7–7.7)
Neutrophils Relative %: 52 %
Platelet Count: 147 10*3/uL — ABNORMAL LOW (ref 150–400)
RBC: 3.24 MIL/uL — ABNORMAL LOW (ref 3.87–5.11)
RDW: 15.9 % — ABNORMAL HIGH (ref 11.5–15.5)
WBC Count: 3 10*3/uL — ABNORMAL LOW (ref 4.0–10.5)
nRBC: 0 % (ref 0.0–0.2)

## 2020-04-06 NOTE — Progress Notes (Signed)
  [facility] OFFICE PROGRESS NOTE   Diagnosis: Esophagus cancer  INTERVAL HISTORY:   Alexandria Buck returns for follow-up.  She is tolerating a regular diet.  She has very mild intermittent pain with swallowing.  She takes hydrocodone elixir if needed.  No nausea or vomiting.  No constipation or diarrhea.  No numbness or tingling in the hands or feet.  Objective:  Vital signs in last 24 hours:  Blood pressure 105/63, pulse 88, temperature 99.1 F (37.3 C), temperature source Tympanic, resp. rate 18, height 5\' 3"  (1.6 m), weight 84 lb 8 oz (38.3 kg), SpO2 100 %.    HEENT: No thrush or ulcers. Resp: Lungs clear bilaterally. Cardio: Regular rate and rhythm. GI: No hepatomegaly. Vascular: No leg edema. Skin: Left mid back with skin hyperpigmentation, mild dry desquamation.   Lab Results:  Lab Results  Component Value Date   WBC 3.0 (L) 04/06/2020   HGB 9.5 (L) 04/06/2020   HCT 28.5 (L) 04/06/2020   MCV 88.0 04/06/2020   PLT 147 (L) 04/06/2020   NEUTROABS 1.5 (L) 04/06/2020    Imaging:  No results found.  Medications: I have reviewed the patient's current medications.  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 Endoscopy 01/14/2020-partially obstructing mid esophagus mass ? PET scan 01/25/2020-hypermetabolic mid esophagus mass, mildly hypermetabolic right axillary lymph nodes ? Radiation 02/09/2020-03/22/2020 ? Cycle 1 Taxol/carboplatin 02/10/2020 ? Cycle 2 Taxol/carboplatin 02/17/2020 ? Cycle 3 Taxol/carboplatin 02/24/2020 ? Chemotherapy held 03/02/2020 due to neutropenia ? Cycle 4 Taxol/carboplatin 03/09/2020 (Taxol dose reduced due to previous neutropenia) ? Cycle 5 Taxol/carboplatin 03/16/2020 2. Benign upper esophageal stricture, status post dilation 01/14/2020, 02/08/2020 3. H. pylori gastritis, treated with medical therapy October 2021 4. Cardiomyopathy 5. Bipolar disorder 6. Tobacco and alcohol use 7. Hypertension 8. COVID-19  vaccination, last 01/05/2020 9. Urinary tract infection 02/16/2020 10. Odynophagia secondary to radiation   Disposition: Alexandria Buck appears stable.  She is now about 2 weeks out from completing radiation.  She is tolerating a regular diet and having minimal pain with swallowing.  She has gained weight since her last visit.  We are referring her for a restaging chest CT and upper endoscopy at a 6 to 8-week interval from completion of therapy.  She will return to the lab today for follow-up CBC and chemistry panel.  We will see her in follow-up in 3 weeks.  She will contact the office in the interim with any problems.  Plan reviewed with Dr. Lendell Caprice.  Addendum 3:25 PM-potassium level returned at 2.7.  I contacted her by phone.  She has not taken K-dur in several days.  She will take K-Dur 20 equivalents twice daily for the next 3 days then once daily.  She will return for a follow-up basic metabolic panel early next week.  Alexandria Buck ANP/GNP-BC   04/06/2020  3:25 PM

## 2020-04-06 NOTE — Telephone Encounter (Signed)
CRITICAL VALUE STICKER  CRITICAL VALUE:Potassium 2.7  RECEIVER (on-site recipient of call): Henrene Dodge, RN  DATE & TIME NOTIFIED: 04/06/20;1515  MESSENGER (representative from lab): Hilda Lias  MD NOTIFIED: Lonna Cobb, NP  TIME OF NOTIFICATION: 1517  RESPONSE: Information acknowledged

## 2020-04-07 ENCOUNTER — Telehealth: Payer: Self-pay | Admitting: Nurse Practitioner

## 2020-04-07 ENCOUNTER — Telehealth: Payer: Self-pay

## 2020-04-07 NOTE — Telephone Encounter (Signed)
Please send in refills for Entresto 49mg -51mg  to [facility].  Fax hardcopy to [phone].

## 2020-04-07 NOTE — Telephone Encounter (Signed)
fyi

## 2020-04-07 NOTE — Telephone Encounter (Signed)
Prescription with refills faxed to [facility]

## 2020-04-07 NOTE — Telephone Encounter (Signed)
Scheduled appointments per 12/29 los. Spoke to patient who is aware of appointments date and times.  

## 2020-04-14 ENCOUNTER — Other Ambulatory Visit: Payer: Self-pay | Admitting: Nurse Practitioner

## 2020-04-14 DIAGNOSIS — C154 Malignant neoplasm of middle third of esophagus: Secondary | ICD-10-CM

## 2020-04-14 NOTE — Telephone Encounter (Signed)
Will refuse her refill request

## 2020-04-15 ENCOUNTER — Telehealth: Payer: Self-pay | Admitting: *Deleted

## 2020-04-15 NOTE — Telephone Encounter (Signed)
Left VM that she needs refill on her liquid pain medication that she takes every 6 hours. RN called back and left VM requesting return call to discuss her level of pain. At last visit it was reported as mild. If pain is severe, needs to come in before any refill is done. The directions were also reduced to every 12 hours at last refill.

## 2020-04-25 ENCOUNTER — Telehealth: Payer: Self-pay | Admitting: Radiation Oncology

## 2020-04-25 MED FILL — LOSARTAN POTASSIUM 25 MG TA: 25 | 30 days supply | Qty: 30 | Fill #1

## 2020-04-25 MED FILL — SPIRONOLACTONE 25 MG TABS: 25 | 30 days supply | Qty: 15 | Fill #0

## 2020-04-25 NOTE — Telephone Encounter (Signed)
  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   Narrative:  The patient was contacted today for routine follow-up. During treatment she did very well with radiotherapy and did not have significant desquamation.   Impression/Plan: 1. 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 The patient has been doing well since completion of radiotherapy. I was unable to reach the patient but on a voicemail I discussed that we would be happy to continue to follow her as needed, but she will also continue to follow up with Dr. Learta Codding in medical oncology as well as with GI for surveillance as I believe it's been determined that esophagectomy would be high risk with her heart disease and that she would not have surgery.    Carola Rhine, PAC

## 2020-04-29 ENCOUNTER — Inpatient Hospital Stay: Payer: Self-pay | Attending: Radiation Oncology

## 2020-04-29 ENCOUNTER — Other Ambulatory Visit: Payer: Self-pay

## 2020-04-29 ENCOUNTER — Inpatient Hospital Stay: Payer: Self-pay

## 2020-04-29 ENCOUNTER — Inpatient Hospital Stay (HOSPITAL_BASED_OUTPATIENT_CLINIC_OR_DEPARTMENT_OTHER): Payer: Self-pay | Admitting: Oncology

## 2020-04-29 VITALS — BP 145/93 | HR 81 | Temp 98.0°F | Resp 18 | Ht 63.0 in | Wt 93.9 lb

## 2020-04-29 DIAGNOSIS — Z23 Encounter for immunization: Secondary | ICD-10-CM | POA: Insufficient documentation

## 2020-04-29 DIAGNOSIS — C154 Malignant neoplasm of middle third of esophagus: Secondary | ICD-10-CM

## 2020-04-29 LAB — CBC WITH DIFFERENTIAL (CANCER CENTER ONLY)
Abs Immature Granulocytes: 0.08 10*3/uL — ABNORMAL HIGH (ref 0.00–0.07)
Basophils Absolute: 0 10*3/uL (ref 0.0–0.1)
Basophils Relative: 0 %
Eosinophils Absolute: 0 10*3/uL (ref 0.0–0.5)
Eosinophils Relative: 0 %
HCT: 31.4 % — ABNORMAL LOW (ref 36.0–46.0)
Hemoglobin: 10.3 g/dL — ABNORMAL LOW (ref 12.0–15.0)
Immature Granulocytes: 2 %
Lymphocytes Relative: 25 %
Lymphs Abs: 1 10*3/uL (ref 0.7–4.0)
MCH: 30.9 pg (ref 26.0–34.0)
MCHC: 32.8 g/dL (ref 30.0–36.0)
MCV: 94.3 fL (ref 80.0–100.0)
Monocytes Absolute: 0.6 10*3/uL (ref 0.1–1.0)
Monocytes Relative: 16 %
Neutro Abs: 2.2 10*3/uL (ref 1.7–7.7)
Neutrophils Relative %: 57 %
Platelet Count: 298 10*3/uL (ref 150–400)
RBC: 3.33 MIL/uL — ABNORMAL LOW (ref 3.87–5.11)
RDW: 19.1 % — ABNORMAL HIGH (ref 11.5–15.5)
WBC Count: 4 10*3/uL (ref 4.0–10.5)
nRBC: 0 % (ref 0.0–0.2)

## 2020-04-29 LAB — CMP (CANCER CENTER ONLY)
ALT: 17 U/L (ref 0–44)
AST: 24 U/L (ref 15–41)
Albumin: 3 g/dL — ABNORMAL LOW (ref 3.5–5.0)
Alkaline Phosphatase: 45 U/L (ref 38–126)
Anion gap: 10 (ref 5–15)
BUN: 15 mg/dL (ref 8–23)
CO2: 26 mmol/L (ref 22–32)
Calcium: 8.4 mg/dL — ABNORMAL LOW (ref 8.9–10.3)
Chloride: 103 mmol/L (ref 98–111)
Creatinine: 1.05 mg/dL — ABNORMAL HIGH (ref 0.44–1.00)
GFR, Estimated: >60 mL/min (ref >60)
Glucose, Bld: 82 mg/dL (ref 70–99)
Potassium: 3.4 mmol/L — ABNORMAL LOW (ref 3.5–5.1)
Sodium: 139 mmol/L (ref 135–145)
Total Bilirubin: 0.3 mg/dL (ref 0.3–1.2)
Total Protein: 7.9 g/dL (ref 6.5–8.1)

## 2020-04-29 NOTE — Progress Notes (Signed)
°  [place] OFFICE PROGRESS NOTE   Diagnosis: Esophagus cancer  INTERVAL HISTORY:   Alexandria Buck returns as scheduled.  She feels well.  She no longer has dysphagia or odynophagia.  She is tolerating a diet.  She reports a good appetite.  She would like to continue prednisone.  She has occasional "numbness "in the right arm.  Objective:  Vital signs in last 24 hours:  Blood pressure (!) 145/93, pulse 81, temperature 98 F (36.7 C), temperature source Tympanic, resp. rate 18, height 5\' 3"  (1.6 m), weight 42.6 kg, SpO2 100 %.    HEENT: Neck without mass Lymphatics: No cervical or supraclavicular nodes.  "Shotty "bilateral axillary nodes. Resp: Lungs clear bilaterally Cardio: Regular rate and rhythm GI: No hepatomegaly, no mass Vascular: No leg edema Neuro: The arm and hand strength is intact bilaterally Skin: Resolving radiation hyperpigmentation at the mid back   Lab Results:  Lab Results  Component Value Date   WBC 4.0 04/29/2020   HGB 10.3 (L) 04/29/2020   HCT 31.4 (L) 04/29/2020   MCV 94.3 04/29/2020   PLT 298 04/29/2020   NEUTROABS 2.2 04/29/2020    CMP  Lab Results  Component Value Date   NA 139 04/29/2020   K 3.4 (L) 04/29/2020   CL 103 04/29/2020   CO2 26 04/29/2020   GLUCOSE 82 04/29/2020   BUN 15 04/29/2020   CREATININE 1.05 (H) 04/29/2020   CALCIUM 8.4 (L) 04/29/2020   PROT 7.9 04/29/2020   ALBUMIN 3.0 (L) 04/29/2020   AST 24 04/29/2020   ALT 17 04/29/2020   ALKPHOS 45 04/29/2020   BILITOT 0.3 04/29/2020   GFRNONAA >60 04/29/2020   GFRAA 87 12/31/2019    Medications: I have reviewed the patient's current medications.   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 Endoscopy 01/14/2020-partially obstructing mid esophagus mass ? PET scan 01/25/2020-hypermetabolic mid esophagus mass, mildly hypermetabolic right axillary lymph nodes ? Radiation 02/09/2020-03/22/2020 ? Cycle 1 Taxol/carboplatin 02/10/2020 ? Cycle  2 Taxol/carboplatin 02/17/2020 ? Cycle 3 Taxol/carboplatin 02/24/2020 ? Chemotherapy held 03/02/2020 due to neutropenia ? Cycle 4 Taxol/carboplatin 03/09/2020 (Taxol dose reduced due to previous neutropenia) ? Cycle 5 Taxol/carboplatin 03/16/2020 2. Benign upper esophageal stricture, status post dilation 01/14/2020, 02/08/2020 3. H. pylori gastritis, treated with medical therapy October 2021 4. Cardiomyopathy 5. Bipolar disorder 6. Tobacco and alcohol use 7. Hypertension 8. COVID-19 vaccination, booster 04/29/2020 9. Urinary tract infection 02/16/2020 10. Odynophagia secondary to radiation-resolved    Disposition: Alexandria Buck completed the course of chemotherapy and radiation.  She no longer has odynophagia and she has gained weight over the past month.  She is no longer taking pain medication. She would like to continue prednisone as an appetite stimulant.  We will begin weaning the prednisone at the next office visit.  Alexandria Buck will be referred for a restaging chest CT prior to an office visit on 06/07/2020.  We will then asked Dr. Rush Landmark to perform a restaging endoscopy.  She received the COVID-19 booster vaccine today.  Betsy Coder, MD  04/29/2020  9:12 AM

## 2020-04-29 NOTE — Progress Notes (Signed)
   Covid-19 Vaccination Clinic  Name:  Alexandria Buck    MRN: [id number] DOB: [birth year]  04/29/2020  Alexandria Buck was observed post Covid-19 immunization for 15 minutes without incident. She was provided with Vaccine Information Sheet and instruction to access the V-Safe system.   Alexandria Buck was instructed to call 911 with any severe reactions post vaccine: Marland Kitchen Difficulty breathing  . Swelling of face and throat  . A fast heartbeat  . A bad rash all over body  . Dizziness and weakness   Immunizations Administered    Name Date Dose VIS Date Route   Pfizer COVID-19 Vaccine 04/29/2020  4:30 PM 0.3 mL 01/27/2020 Intramuscular   Manufacturer: [place]   Lot: [id number]   NDC: [address]-6

## 2020-05-02 ENCOUNTER — Telehealth: Payer: Self-pay | Admitting: Oncology

## 2020-05-02 NOTE — Telephone Encounter (Signed)
Scheduled appointments per 1/21 los. Called patient, no answer. Left message with appointments date and times.

## 2020-05-06 ENCOUNTER — Other Ambulatory Visit: Payer: Self-pay

## 2020-05-06 ENCOUNTER — Telehealth: Payer: Self-pay

## 2020-05-06 ENCOUNTER — Other Ambulatory Visit: Payer: Self-pay | Admitting: Nurse Practitioner

## 2020-05-06 DIAGNOSIS — C154 Malignant neoplasm of middle third of esophagus: Secondary | ICD-10-CM

## 2020-05-06 MED FILL — POTASSIUM CHLORIDE CRYS ER: 20 | 30 days supply | Qty: 30 | Fill #0

## 2020-05-06 MED FILL — predniSONE 10 MG TABS: 10 | 30 days supply | Qty: 30 | Fill #1

## 2020-05-06 NOTE — Telephone Encounter (Signed)
Received refill request for Potassium 20 meq 1 po daily. Contacted pt to verify she was taking this med daily. Pt states she takes daily.  Refill sent in.

## 2020-05-07 NOTE — Progress Notes (Signed)
  Radiation Oncology         [phone] ________________________________  Name: Alexandria Buck MRN: [id number]  Date: 03/22/2020  DOB: 06-May-1960  End of Treatment Note  Diagnosis:   esophageal cancer     Indication for treatment::  curative       Radiation treatment dates:   02/09/20 - 03/22/20  Site/dose:   The patient was treated to the esophagus to a dose of 45 Gy in 25 fractions using an IMRT technique. The patient then received a boost for an additional 5.4 Gy to yield a total dose of 50.4 Gy. An SIB technique was also used to treat the tumor to a dose of 50 Gy/6Gy for a total of 56 Gy total.  Narrative: The patient tolerated radiation treatment and completed the prescribed course.     Plan: The patient has completed radiation treatment. The patient will return to radiation oncology clinic for routine followup in one month. I advised the patient to call or return sooner if they have any questions or concerns related to their recovery or treatment. ________________________________  Jodelle Gross, M.D., Ph.D.

## 2020-05-09 ENCOUNTER — Telehealth: Payer: Self-pay

## 2020-05-09 NOTE — Telephone Encounter (Signed)
Pt contacted regarding her request for pain medicine for her back. Reminded pt per her last appointment at [facility] that her PCP needed to handle her pain med requests for anything not related to her CA. Pt is not being actively tx for her CA so her PCP needed to be consulted. Pt verbalized understanding.

## 2020-05-10 ENCOUNTER — Telehealth: Payer: Self-pay | Admitting: Nurse Practitioner

## 2020-05-10 NOTE — Telephone Encounter (Signed)
Scheduled lab appointment per 1/28 schedule message. Patient is aware.

## 2020-05-17 MED FILL — POTASSIUM CHLORIDE CRYS ER: 20 | 30 days supply | Qty: 30 | Fill #0

## 2020-05-17 MED FILL — predniSONE 10 MG TABS: 10 | 30 days supply | Qty: 30 | Fill #1

## 2020-05-19 MED FILL — SPIRONOLACTONE 25 MG TABS: 25 | 30 days supply | Qty: 15 | Fill #1

## 2020-05-20 ENCOUNTER — Inpatient Hospital Stay: Payer: MEDICAID | Attending: Nurse Practitioner

## 2020-05-23 ENCOUNTER — Telehealth: Payer: Self-pay | Admitting: *Deleted

## 2020-05-23 NOTE — Telephone Encounter (Signed)
Called to ask to get her appointments scheduled that she had missed. Called her back and provided appointments for 06/07/20: Lab here at 0930 with CT scan at [facility] at 1030. Return to see Dr. Benay Spice same day at 12:15. Instructed her to take only liquids for 4 hours prior to scan on 06/07/20.
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
## 2020-06-06 ENCOUNTER — Encounter: Payer: Self-pay | Admitting: Cardiology

## 2020-06-06 ENCOUNTER — Other Ambulatory Visit: Payer: Self-pay

## 2020-06-06 ENCOUNTER — Telehealth: Payer: Self-pay | Admitting: *Deleted

## 2020-06-06 ENCOUNTER — Ambulatory Visit (INDEPENDENT_AMBULATORY_CARE_PROVIDER_SITE_OTHER): Payer: Self-pay | Admitting: Cardiology

## 2020-06-06 VITALS — BP 147/97 | HR 73 | Ht 61.0 in | Wt 93.8 lb

## 2020-06-06 DIAGNOSIS — I5042 Chronic combined systolic (congestive) and diastolic (congestive) heart failure: Secondary | ICD-10-CM

## 2020-06-06 DIAGNOSIS — I10 Essential (primary) hypertension: Secondary | ICD-10-CM

## 2020-06-06 DIAGNOSIS — I340 Nonrheumatic mitral (valve) insufficiency: Secondary | ICD-10-CM

## 2020-06-06 DIAGNOSIS — R002 Palpitations: Secondary | ICD-10-CM

## 2020-06-06 NOTE — Telephone Encounter (Signed)
Left VM reminding patient of her lab/CT and OV appointments tomorrow.

## 2020-06-06 NOTE — Patient Instructions (Signed)
Medication Instructions:  Your physician recommends that you continue on your current medications as directed. Please refer to the Current Medication list given to you today.  *If you need a refill on your cardiac medications before your next appointment, please call your pharmacy*   Lab Work: CMET, Mag today  If you have labs (blood work) drawn today and your tests are completely normal, you will receive your results only by: Marland Kitchen MyChart Message (if you have MyChart) OR . A paper copy in the mail If you have any lab test that is abnormal or we need to change your treatment, we will call you to review the results.  Follow-Up: At [facility], you and your health needs are our priority.  As part of our continuing mission to provide you with exceptional heart care, we have created designated Provider Care Teams.  These Care Teams include your primary Cardiologist (physician) and Advanced Practice Providers (APPs -  Physician Assistants and Nurse Practitioners) who all work together to provide you with the care you need, when you need it.  We recommend signing up for the patient portal called "MyChart".  Sign up information is provided on this After Visit Summary.  MyChart is used to connect with patients for Virtual Visits (Telemedicine).  Patients are able to view lab/test results, encounter notes, upcoming appointments, etc.  Non-urgent messages can be sent to your provider as well.   To learn more about what you can do with MyChart, go to [email or web address].    Your next appointment:   3 month(s)  The format for your next appointment:   In Person  Provider:   Oswaldo Milian, MD

## 2020-06-07 ENCOUNTER — Other Ambulatory Visit: Payer: Self-pay

## 2020-06-07 ENCOUNTER — Inpatient Hospital Stay (HOSPITAL_BASED_OUTPATIENT_CLINIC_OR_DEPARTMENT_OTHER): Payer: Medicaid Other | Admitting: Nurse Practitioner

## 2020-06-07 ENCOUNTER — Encounter: Payer: Self-pay | Admitting: Nurse Practitioner

## 2020-06-07 ENCOUNTER — Ambulatory Visit (HOSPITAL_COMMUNITY)
Admission: RE | Admit: 2020-06-07 | Discharge: 2020-06-07 | Disposition: A | Discharge status: Home / Self Care | Payer: Medicaid Other | Source: Ambulatory Visit | Attending: Oncology | Admitting: Oncology

## 2020-06-07 ENCOUNTER — Inpatient Hospital Stay: Payer: Medicaid Other | Attending: Nurse Practitioner

## 2020-06-07 VITALS — BP 155/90 | HR 70 | Temp 97.7°F | Resp 18 | Ht 61.0 in | Wt 94.5 lb

## 2020-06-07 DIAGNOSIS — F319 Bipolar disorder, unspecified: Secondary | ICD-10-CM | POA: Diagnosis not present

## 2020-06-07 DIAGNOSIS — C154 Malignant neoplasm of middle third of esophagus: Secondary | ICD-10-CM

## 2020-06-07 DIAGNOSIS — I10 Essential (primary) hypertension: Secondary | ICD-10-CM | POA: Insufficient documentation

## 2020-06-07 DIAGNOSIS — M549 Dorsalgia, unspecified: Secondary | ICD-10-CM | POA: Diagnosis not present

## 2020-06-07 LAB — CBC WITH DIFFERENTIAL (CANCER CENTER ONLY)
Abs Immature Granulocytes: 0.03 10*3/uL (ref 0.00–0.07)
Basophils Absolute: 0 10*3/uL (ref 0.0–0.1)
Basophils Relative: 0 %
Eosinophils Absolute: 0 10*3/uL (ref 0.0–0.5)
Eosinophils Relative: 1 %
HCT: 38.2 % (ref 36.0–46.0)
Hemoglobin: 12.6 g/dL (ref 12.0–15.0)
Immature Granulocytes: 1 %
Lymphocytes Relative: 27 %
Lymphs Abs: 1.6 10*3/uL (ref 0.7–4.0)
MCH: 31.9 pg (ref 26.0–34.0)
MCHC: 33 g/dL (ref 30.0–36.0)
MCV: 96.7 fL (ref 80.0–100.0)
Monocytes Absolute: 0.8 10*3/uL (ref 0.1–1.0)
Monocytes Relative: 13 %
Neutro Abs: 3.6 10*3/uL (ref 1.7–7.7)
Neutrophils Relative %: 58 %
Platelet Count: 294 10*3/uL (ref 150–400)
RBC: 3.95 MIL/uL (ref 3.87–5.11)
RDW: 13 % (ref 11.5–15.5)
WBC Count: 6 10*3/uL (ref 4.0–10.5)
nRBC: 0 % (ref 0.0–0.2)

## 2020-06-07 LAB — COMPREHENSIVE METABOLIC PANEL
ALT: 62 IU/L — ABNORMAL HIGH (ref 0–32)
AST: 85 IU/L — ABNORMAL HIGH (ref 0–40)
Albumin/Globulin Ratio: 1.2 (ref 1.2–2.2)
Albumin: 4.2 g/dL (ref 3.8–4.8)
Alkaline Phosphatase: 71 IU/L (ref 44–121)
BUN/Creatinine Ratio: 13 (ref 12–28)
BUN: 11 mg/dL (ref 8–27)
Bilirubin Total: <0.2 mg/dL (ref 0.0–1.2)
CO2: 18 mmol/L — ABNORMAL LOW (ref 20–29)
Calcium: 9.5 mg/dL (ref 8.7–10.3)
Chloride: 105 mmol/L (ref 96–106)
Creatinine, Ser: 0.85 mg/dL (ref 0.57–1.00)
Globulin, Total: 3.6 g/dL (ref 1.5–4.5)
Glucose: 95 mg/dL (ref 65–99)
Potassium: 4 mmol/L (ref 3.5–5.2)
Sodium: 139 mmol/L (ref 134–144)
Total Protein: 7.8 g/dL (ref 6.0–8.5)
eGFR: 78 mL/min/{1.73_m2} (ref >59)

## 2020-06-07 LAB — BASIC METABOLIC PANEL - CANCER CENTER ONLY
Anion gap: 9 (ref 5–15)
BUN: 14 mg/dL (ref 8–23)
CO2: 21 mmol/L — ABNORMAL LOW (ref 22–32)
Calcium: 8.8 mg/dL — ABNORMAL LOW (ref 8.9–10.3)
Chloride: 112 mmol/L — ABNORMAL HIGH (ref 98–111)
Creatinine: 0.96 mg/dL (ref 0.44–1.00)
GFR, Estimated: >60 mL/min (ref >60)
Glucose, Bld: 80 mg/dL (ref 70–99)
Potassium: 3.8 mmol/L (ref 3.5–5.1)
Sodium: 142 mmol/L (ref 135–145)

## 2020-06-07 LAB — MAGNESIUM: Magnesium: 2 mg/dL (ref 1.6–2.3)

## 2020-06-07 MED ORDER — IOHEXOL 300 MG/ML  SOLN
75.0000 mL | Freq: Once | INTRAMUSCULAR | Status: AC | PRN
  Administered 2020-06-07: 75 mL via INTRAVENOUS

## 2020-06-07 NOTE — Progress Notes (Addendum)
[facility] OFFICE PROGRESS NOTE   Diagnosis: Esophagus cancer  INTERVAL HISTORY:   Alexandria Buck returns for follow-up.  She her reports pain beneath the right shoulder blade for about 2 weeks.  She is not aware of any injury.  She is tolerating a regular diet.  She intermittently notes pain with swallowing.  Periodic nausea/vomiting.  No constipation or diarrhea.  Objective:  Vital signs in last 24 hours:  Blood pressure (!) 155/90, pulse 70, temperature 97.7 F (36.5 C), temperature source Tympanic, resp. rate 18, height 5\' 1"  (1.549 m), weight 94 lb 8 oz (42.9 kg), SpO2 100 %.    HEENT: No thrush or ulcers. Lymphatics: No palpable cervical or supraclavicular lymph nodes. Resp: Lungs clear bilaterally. Cardio: Regular rate and rhythm. GI: No hepatosplenomegaly. Vascular: No leg edema. Musculoskeletal: Tender right mid paraspinal region/mid back. Skin: No evidence of a shingles rash.  Faint skin hyperpigmentation on the back related to radiation.   Lab Results:  Lab Results  Component Value Date   WBC 6.0 06/07/2020   HGB 12.6 06/07/2020   HCT 38.2 06/07/2020   MCV 96.7 06/07/2020   PLT 294 06/07/2020   NEUTROABS 3.6 06/07/2020    Imaging:  CT CHEST W CONTRAST  Result Date: 06/07/2020 CLINICAL DATA:  Esophageal cancer. Post radiation therapy and chemotherapy. EXAM: CT CHEST WITH CONTRAST TECHNIQUE: Multidetector CT imaging of the chest was performed during intravenous contrast administration. CONTRAST:  37mL OMNIPAQUE IOHEXOL 300 MG/ML  SOLN COMPARISON:  FDG PET scan 01/25/2020, CT chest 02/09/2020 FINDINGS: Cardiovascular: No significant vascular findings. Normal heart size. No pericardial effusion. Mediastinum/Nodes: Indistinct tissue planes along the course of the esophagus inferior to the carina. Node discrete mass lesion. No evidence of obstruction of the esophagus. RIGHT hilar lymph node measures 11 mm (image 70/2) not changed from 12 mm on comparison  CT 03/20/2020. No new lymph nodes identified in the mediastinum. Lungs/Pleura: Paraseptal emphysema the upper lobes. Subpleural cystic change in the RIGHT lower lobe. No suspicious pulmonary nodules. Upper Abdomen: Limited view of the liver, kidneys, pancreas are unremarkable. Normal adrenal glands. No upper abdominal adenopathy. Musculoskeletal: No aggressive osseous lesion IMPRESSION: 1. Indistinct tissue planes in the mediastinum along the esophagus following radiation therapy. No mass lesion identified. 2. No new mediastinal lymphadenopathy. Stable RIGHT hilar lymph node. 3. No evidence of pulmonary metastasis. 4. Paraseptal emphysema in the lungs. Electronically Signed   By: Suzy Bouchard M.D.   On: 06/07/2020 12:04    Medications: I have reviewed the patient's current medications.  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 Endoscopy 01/14/2020-partially obstructing mid esophagus mass ? PET scan 01/25/2020-hypermetabolic mid esophagus mass, mildly hypermetabolic right axillary lymph nodes ? Radiation 02/09/2020-03/22/2020 ? Cycle 1 Taxol/carboplatin 02/10/2020 ? Cycle 2 Taxol/carboplatin 02/17/2020 ? Cycle 3 Taxol/carboplatin 02/24/2020 ? Chemotherapy held 03/02/2020 due to neutropenia ? Cycle 4 Taxol/carboplatin 03/09/2020 (Taxol dose reduced due to previous neutropenia) ? Cycle 5 Taxol/carboplatin 03/16/2020 ? CT chest 06/07/2020-indistinct tissue planes in the mediastinum along the esophagus following radiation therapy.  No mass lesion identified.  No new mediastinal lymphadenopathy.  Stable right hilar lymph node. 2. Benign upper esophageal stricture, status post dilation 01/14/2020, 02/08/2020 3. H. pylori gastritis, treated with medical therapy October 2021 4. Cardiomyopathy 5. Bipolar disorder 6. Tobacco and alcohol use 7. Hypertension 8. COVID-19 vaccination, booster 04/29/2020 9. Urinary tract infection 02/16/2020 10. Odynophagia secondary to  radiation-resolved   Disposition: Ms. Will appears stable.  No esophagus mass identified on the chest CT  from earlier today.  Dr. Benay Spice recommends a referral to Dr. Rush Landmark for a restaging upper endoscopy.  Referral placed today.  Etiology of back pain/tenderness unclear.  We reviewed the CT images with Ms. Conley Canal.  No bone/rib abnormality noted.  She will continue Tylenol/ibuprofen as needed.  She will return for follow-up in 3 weeks.  We are available to see her sooner if needed.  Patient seen with Dr. Benay Spice.    Ned Card ANP/GNP-BC   06/07/2020  12:27 PM This was a shared visit with Ned Card.  [name] was interviewed and examined.  We reviewed the CT findings and images with Ms. Conley Canal.  There is no CT evidence of remaining tumor.  The etiology of her back pain is unclear.  We recommend a referral to Dr. Rush Landmark for a restaging upper endoscopy.  We will consider treatment with a PD-1 inhibitor if there is remaining tumor in the esophagus.  I was present for greater than 50% of today's visit.  I performed medical decision making.  Julieanne Manson, MD

## 2020-06-08 ENCOUNTER — Telehealth: Payer: Self-pay | Admitting: Nurse Practitioner

## 2020-06-08 NOTE — Telephone Encounter (Signed)
Scheduled appointment per 3/2 los. Spoke to patient who is aware of appointment date and time.

## 2020-06-28 MED FILL — spIRONOLACTONE 25 MG TABS: 25 | 60 days supply | Qty: 30 | Fill #2

## 2020-06-28 MED FILL — FUROSEMIDE 20 MG TABS: 20 | 90 days supply | Qty: 45 | Fill #1

## 2020-06-28 MED FILL — LOSARTAN POTASSIUM 25 MG TA: 25 | 30 days supply | Qty: 30 | Fill #2

## 2020-07-01 ENCOUNTER — Observation Stay (HOSPITAL_COMMUNITY)
Admission: EM | Admit: 2020-07-01 | Discharge: 2020-07-02 | Disposition: A | Discharge status: Home / Self Care | Payer: Medicaid Other | Attending: Internal Medicine | Admitting: Internal Medicine

## 2020-07-01 ENCOUNTER — Other Ambulatory Visit: Payer: Self-pay

## 2020-07-01 ENCOUNTER — Inpatient Hospital Stay: Payer: Medicaid Other | Admitting: Oncology

## 2020-07-01 ENCOUNTER — Emergency Department (HOSPITAL_COMMUNITY): Payer: Medicaid Other

## 2020-07-01 DIAGNOSIS — K222 Esophageal obstruction: Secondary | ICD-10-CM | POA: Diagnosis not present

## 2020-07-01 DIAGNOSIS — Z79899 Other long term (current) drug therapy: Secondary | ICD-10-CM | POA: Insufficient documentation

## 2020-07-01 DIAGNOSIS — I5021 Acute systolic (congestive) heart failure: Secondary | ICD-10-CM | POA: Insufficient documentation

## 2020-07-01 DIAGNOSIS — I110 Hypertensive heart disease with heart failure: Secondary | ICD-10-CM | POA: Insufficient documentation

## 2020-07-01 DIAGNOSIS — Z8501 Personal history of malignant neoplasm of esophagus: Secondary | ICD-10-CM | POA: Diagnosis not present

## 2020-07-01 DIAGNOSIS — Z87891 Personal history of nicotine dependence: Secondary | ICD-10-CM | POA: Diagnosis not present

## 2020-07-01 DIAGNOSIS — K449 Diaphragmatic hernia without obstruction or gangrene: Secondary | ICD-10-CM | POA: Insufficient documentation

## 2020-07-01 DIAGNOSIS — Z20822 Contact with and (suspected) exposure to covid-19: Secondary | ICD-10-CM | POA: Insufficient documentation

## 2020-07-01 DIAGNOSIS — R0789 Other chest pain: Secondary | ICD-10-CM

## 2020-07-01 DIAGNOSIS — C154 Malignant neoplasm of middle third of esophagus: Secondary | ICD-10-CM

## 2020-07-01 DIAGNOSIS — R1310 Dysphagia, unspecified: Secondary | ICD-10-CM

## 2020-07-01 DIAGNOSIS — R1110 Vomiting, unspecified: Secondary | ICD-10-CM

## 2020-07-01 LAB — CBC
HCT: 38.8 % (ref 36.0–46.0)
Hemoglobin: 12.4 g/dL (ref 12.0–15.0)
MCH: 30.9 pg (ref 26.0–34.0)
MCHC: 32 g/dL (ref 30.0–36.0)
MCV: 96.8 fL (ref 80.0–100.0)
Platelets: 270 10*3/uL (ref 150–400)
RBC: 4.01 MIL/uL (ref 3.87–5.11)
RDW: 13.1 % (ref 11.5–15.5)
WBC: 3.4 10*3/uL — ABNORMAL LOW (ref 4.0–10.5)
nRBC: 0 % (ref 0.0–0.2)

## 2020-07-01 LAB — SARS CORONAVIRUS 2 (TAT 6-24 HRS): SARS Coronavirus 2: NEGATIVE

## 2020-07-01 LAB — URINALYSIS, ROUTINE W REFLEX MICROSCOPIC
Bilirubin Urine: NEGATIVE
Glucose, UA: NEGATIVE mg/dL
Hgb urine dipstick: NEGATIVE
Ketones, ur: NEGATIVE mg/dL
Leukocytes,Ua: NEGATIVE
Nitrite: NEGATIVE
Protein, ur: NEGATIVE mg/dL
Specific Gravity, Urine: 1.018 (ref 1.005–1.030)
pH: 5 (ref 5.0–8.0)

## 2020-07-01 LAB — BASIC METABOLIC PANEL
Anion gap: 3 — ABNORMAL LOW (ref 5–15)
BUN: 9 mg/dL (ref 8–23)
CO2: 23 mmol/L (ref 22–32)
Calcium: 8.7 mg/dL — ABNORMAL LOW (ref 8.9–10.3)
Chloride: 115 mmol/L — ABNORMAL HIGH (ref 98–111)
Creatinine, Ser: 0.83 mg/dL (ref 0.44–1.00)
GFR, Estimated: >60 mL/min (ref >60)
Glucose, Bld: 76 mg/dL (ref 70–99)
Potassium: 3.6 mmol/L (ref 3.5–5.1)
Sodium: 141 mmol/L (ref 135–145)

## 2020-07-01 LAB — TROPONIN I (HIGH SENSITIVITY)
Troponin I (High Sensitivity): 12 ng/L (ref <18)
Troponin I (High Sensitivity): 11 ng/L (ref <18)

## 2020-07-01 LAB — HEPATIC FUNCTION PANEL
ALT: 21 U/L (ref 0–44)
AST: 50 U/L — ABNORMAL HIGH (ref 15–41)
Albumin: 2.9 g/dL — ABNORMAL LOW (ref 3.5–5.0)
Alkaline Phosphatase: 51 U/L (ref 38–126)
Bilirubin, Direct: <0.1 mg/dL (ref 0.0–0.2)
Total Bilirubin: 0.2 mg/dL — ABNORMAL LOW (ref 0.3–1.2)
Total Protein: 6.7 g/dL (ref 6.5–8.1)

## 2020-07-01 LAB — LIPASE, BLOOD: Lipase: 24 U/L (ref 11–51)

## 2020-07-01 LAB — BRAIN NATRIURETIC PEPTIDE: B Natriuretic Peptide: 98.5 pg/mL (ref 0.0–100.0)

## 2020-07-01 LAB — D-DIMER, QUANTITATIVE: D-Dimer, Quant: <0.27 ug/mL-FEU (ref 0.00–0.50)

## 2020-07-01 LAB — MAGNESIUM: Magnesium: 1.7 mg/dL (ref 1.7–2.4)

## 2020-07-01 MED ORDER — POTASSIUM CHLORIDE 10 MEQ/100ML IV SOLN
10.0000 meq | INTRAVENOUS | Status: AC
  Administered 2020-07-01 (×3): 10 meq via INTRAVENOUS
  Filled 2020-07-01 (×3): qty 100

## 2020-07-01 MED ORDER — PANTOPRAZOLE SODIUM 40 MG IV SOLR
40.0000 mg | Freq: Two times a day (BID) | INTRAVENOUS | Status: DC
  Administered 2020-07-01 – 2020-07-02 (×2): 40 mg via INTRAVENOUS
  Filled 2020-07-01 (×2): qty 40

## 2020-07-01 MED ORDER — LACTATED RINGERS IV BOLUS: 1000.0000 mL | Freq: Once | INTRAVENOUS | Status: DC

## 2020-07-01 MED ORDER — MORPHINE SULFATE (PF) 4 MG/ML IV SOLN
4.0000 mg | Freq: Once | INTRAVENOUS | Status: AC
Start: 2020-07-01 — End: 2020-07-01
  Administered 2020-07-01: 4 mg via INTRAVENOUS
  Filled 2020-07-01: qty 1

## 2020-07-01 MED ORDER — MORPHINE SULFATE (PF) 4 MG/ML IV SOLN
4.0000 mg | Freq: Once | INTRAVENOUS | Status: AC
  Administered 2020-07-01: 4 mg via INTRAVENOUS
  Filled 2020-07-01: qty 1

## 2020-07-01 MED ORDER — LIDOCAINE VISCOUS HCL 2 % MT SOLN
15.0000 mL | Freq: Once | OROMUCOSAL | Status: AC
  Administered 2020-07-01: 15 mL via ORAL
  Filled 2020-07-01: qty 15

## 2020-07-01 MED ORDER — ONDANSETRON HCL 4 MG/2ML IJ SOLN: 4.0000 mg | Freq: Three times a day (TID) | INTRAMUSCULAR | Status: DC | PRN

## 2020-07-01 MED ORDER — LITHIUM CARBONATE 150 MG PO CAPS
150.0000 mg | ORAL_CAPSULE | Freq: Two times a day (BID) | ORAL | Status: DC
Start: 2020-07-01 — End: 2020-07-02
  Administered 2020-07-01 – 2020-07-02 (×2): 150 mg via ORAL
  Filled 2020-07-01 (×4): qty 1

## 2020-07-01 MED ORDER — SACUBITRIL-VALSARTAN 49-51 MG PO TABS
1.0000 | ORAL_TABLET | Freq: Two times a day (BID) | ORAL | Status: DC
  Administered 2020-07-01 – 2020-07-02 (×2): 1 via ORAL
  Filled 2020-07-01 (×3): qty 1

## 2020-07-01 MED ORDER — ALUM & MAG HYDROXIDE-SIMETH 200-200-20 MG/5ML PO SUSP
30.0000 mL | Freq: Once | ORAL | Status: AC
  Administered 2020-07-01: 30 mL via ORAL
  Filled 2020-07-01: qty 30

## 2020-07-01 MED ORDER — ONDANSETRON HCL 4 MG/2ML IJ SOLN
INTRAMUSCULAR | Status: AC
  Administered 2020-07-01: 4 mg
  Filled 2020-07-01: qty 2

## 2020-07-01 MED ORDER — HYDROMORPHONE HCL 1 MG/ML IJ SOLN
1.0000 mg | Freq: Once | INTRAMUSCULAR | Status: AC
  Administered 2020-07-01: 1 mg via INTRAVENOUS
  Filled 2020-07-01: qty 1

## 2020-07-01 MED ORDER — PREDNISONE 10 MG PO TABS
10.0000 mg | ORAL_TABLET | Freq: Every day | ORAL | Status: DC
  Administered 2020-07-02: 10 mg via ORAL
  Filled 2020-07-01: qty 1

## 2020-07-01 MED ORDER — HYDROMORPHONE HCL 1 MG/ML IJ SOLN
0.5000 mg | INTRAMUSCULAR | Status: DC | PRN
  Administered 2020-07-01 – 2020-07-02 (×4): 0.5 mg via INTRAVENOUS
  Filled 2020-07-01 (×3): qty 1

## 2020-07-01 MED ORDER — LACTATED RINGERS IV SOLN: INTRAVENOUS | Status: DC

## 2020-07-01 MED ORDER — LACTATED RINGERS IV BOLUS
500.0000 mL | Freq: Once | INTRAVENOUS | Status: AC
  Administered 2020-07-01: 500 mL via INTRAVENOUS

## 2020-07-01 MED ORDER — HYDROMORPHONE HCL 1 MG/ML IJ SOLN: 0.5000 mg | INTRAMUSCULAR | Status: DC | PRN

## 2020-07-01 NOTE — ED Triage Notes (Addendum)
Pt BIB EMS for c/o right side CP radiating to right upper back and armpit and associated SOB x 3 weeks. EMS administered 0.4mg  nitro SL x 1 and 324mg  ASA in route. Some relief with nitro. Pt states she takes a rate control medication; unsure of the name. Hx of esophageal CA; just finished radiation/chemo.

## 2020-07-01 NOTE — H&P (Addendum)
Date: 07/01/2020               Patient Name:  Alexandria Buck MRN: [id number]  DOB: 1960/06/27 Age / Sex: 60 y.o., female   PCP: Ladell Pier, MD         Medical Service: Internal Medicine Teaching Service         Attending Physician: Dr. Jimmye Norman, Elaina Pattee, MD    First Contact: Dr. Allyson Sabal Pager: [phone]  Second Contact: Dr. Court Joy Pager: [phone]       After Hours (After 5p/  First Contact Pager: [phone]  weekends / holidays): Second Contact Pager: [phone]   Chief Complaint: pain with swallowing  History of Present Illness:   Ms Alexandria Buck is a 60 year old female with history of HTN, systolic HF 2/2 NICM, and esophageal carcinoma s/p chemoradiation therapy presenting with 1 month history of progressive pain in her chest that radiates into underarms and back. Reports that pain is worse after eating food and with lying down. She does have associated nausea, NBNB emesis, constipation, and weight loss. Denies diaphoresis, headaches, tinnitus, sore throat, abdominal pain, bloody or dark stools, weakness. States that she can still swallow food, but for the past week she is hardly able to tolerate liquids or solids. She has tried using [employer] powders using about 4-6 per day, but with minimal relief.   Reports history of esophageal cancer s/p chemo and radiation. Completed 5 cycles of chemotherapy with Taxol/carboplatin on 03/16/2020 and completed radiation from 02/09/2020 - 03/22/2020. Recent imaging with chest CT (06/07/2020) showing indistinct tissue planes in the mediastinum along with esophagus, but no mass lesion, no mediastinal lymphadenopathy, and no pulmonary metastases.     Meds:  Current Meds  Medication Sig  . alum & mag hydroxide-simeth (MAALOX/MYLANTA) 200-200-20 MG/5ML suspension Take 5 mLs by mouth every 6 (six) hours as needed for indigestion or heartburn. Ok to sub for other OTC antacid suspension (Patient taking differently: Take 15 mLs by mouth 2 (two)  times daily. Ok to sub for other OTC antacid suspension)  . carvedilol (COREG) 12.5 MG tablet Take 1 tablet (12.5 mg total) by mouth 2 (two) times daily.  . furosemide (LASIX) 20 MG tablet Take 1 tablet (20 mg total) by mouth every other day.  . lithium carbonate 150 MG capsule Take 150 mg by mouth 2 (two) times daily with a meal.   . Multiple Vitamin (MULTIVITAMIN) tablet Take 1 tablet by mouth daily.  . potassium chloride SA (KLOR-CON) 20 MEQ tablet TAKE 1 TABLET BY MOUTH ONCE A DAY (Patient taking differently: Take 20 mEq by mouth daily.)  . predniSONE (DELTASONE) 10 MG tablet Take 1 tablet (10 mg total) by mouth daily with breakfast.  . prochlorperazine (COMPAZINE) 5 MG tablet Take 1 tablet (5 mg total) by mouth every 6 (six) hours as needed for nausea or vomiting.  . sacubitril-valsartan (ENTRESTO) 49-51 MG Take 1 tablet by mouth 2 (two) times daily.  Marland Kitchen spironolactone (ALDACTONE) 25 MG tablet Take 0.5 tablets (12.5 mg total) by mouth daily.  . [DISCONTINUED] Aspirin-Acetaminophen-Caffeine (GOODYS EXTRA STRENGTH) [phone] MG PACK Take 1 packet by mouth every 6 (six) hours as needed (pain).     Allergies: Allergies as of 07/01/2020  . (No Known Allergies)   Past Medical History:  Diagnosis Date  . Acute systolic CHF (congestive heart failure) ([place])   . Alcohol abuse   . Bipolar disorder ([place])   . Depression   . Esophageal carcinoma ([place])   .  Hepatitis C   . Hypertension    hx-not taking meds  . NICM (nonischemic cardiomyopathy) ([place])   . Pneumonia    had 5/12    Family History:  Family History  Problem Relation Age of Onset  . Diabetes Mother   . Lung cancer Sister   . Colon cancer Child        oldest daughter   . Liver cancer Child   . Pancreatic cancer Neg Hx   . Esophageal cancer Neg Hx   . Inflammatory bowel disease Neg Hx   . Stomach cancer Neg Hx     Social History:  Social History   Socioeconomic History  . Marital status: Widowed    Spouse name: Not on  file  . Number of children: 2  . Years of education: Not on file  . Highest education level: Not on file  Occupational History  . Occupation: Unemployed  Tobacco Use  . Smoking status: Former Smoker    Packs/day: 1.00    Years: 11.00    Pack years: 11.00    Types: Cigarettes    Quit date: 11/21/2019    Years since quitting: 0.6  . Smokeless tobacco: Never Used  Vaping Use  . Vaping Use: Never used  Substance and Sexual Activity  . Alcohol use: Yes    Comment: 40 oz beer on wkends   . Drug use: Yes    Types: Marijuana    Comment: occasional at holidays  . Sexual activity: Yes    Birth control/protection: Post-menopausal  Other Topics Concern  . Not on file  Social History Narrative  . Not on file   Social Determinants of Health   Financial Resource Strain: Not on file  Food Insecurity: Not on file  Transportation Needs: Not on file  Physical Activity: Not on file  Stress: Not on file  Social Connections: Not on file  Intimate Partner Violence: Not on file    Review of Systems: A complete ROS was negative except as per HPI.   Physical Exam: Blood pressure (!) 151/77, pulse (!) 48, temperature (!) 96.7 F (35.9 C), temperature source Axillary, resp. rate 14, SpO2 100 %. General: elderly female, sitting up in bed, chronically ill-appearing, NAD. HENT: NCAT, mucous membranes dry. Oropharyngeal mucosa clear. CV: bradycardic rate and regular rhythm, no m/r/g. Chest: CTABL, no adventitious sounds noted. TTP over lower esophagus and upper chest. No crepitus noted on chest palpation. Abdomen: soft, nondistended, nontender, normoactive bowel sounds. MSK: no peripheral edema noted bilaterally. Skin: warm and dry Neuro: AAOx4, no focal deficits.  EKG: none  DG Chest 2 View  Result Date: 07/01/2020 CLINICAL DATA:  Chest pain EXAM: CHEST - 2 VIEW COMPARISON:  March 20, 2020 chest radiograph and CT angiogram chest FINDINGS: Nipple shadows noted bilaterally. Bullous  disease noted in the upper lobes. No edema or airspace opacity. Heart is upper normal in size with pulmonary vascularity normal. No adenopathy. No bone lesions IMPRESSION: Upper lobe bullous disease bilaterally. No edema or airspace opacity. Heart upper normal in size. Electronically Signed   By: Lowella Grip III M.D.   On: 07/01/2020 08:37    Assessment & Plan by Problem: Active Problems:   Odynophagia  Patient presenting with dysphagia, odynophagia, weight loss. GI following, appreciate recommendations. Prior endoscopy showed significant narrowing of proximal esophagus requiring dilation in the mid esophagus and esophageal cancer consistent with squamous cell, along with a repeat dilatation showing presence of an esophageal stricture in the proximal esophagus. At this time,  greatest concern for radiation esophagitis given recent completion of chemotherapy and radiation for esophageal carcinoma. There was plan for repeat endoscopy next month, but due to severe findings on presentation, GI with plan to perform diagnostic endoscopy tomorrow with possible dilation and biopsies. Will administer GI cocktail and PPI therapy for now. On clear liquids, but NPO at midnight. Pain control with IV dilaudid 0.5mg  q3h prn. Maintenance fluids with LR @100cc /hr. Continuing prednisone 10mg  for appetite stimulation as per patient, will need to discuss tapering this closer to discharge.   Systolic HFrEF 2/2 NICM EF 20-25% on ECHO 12/2019. LV with severely decreased function and global hypokinesis, G1DD, moderate mitral valve regurgitation. On entresto at home, will resume.   Dispo: Admit patient to Observation with expected length of stay less than 2 midnights.  Signed: Virl Axe, MD 07/01/2020, 5:21 PM  Pager: [phone] After 5pm on weekdays and 1pm on weekends: On Call pager: [phone]
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
## 2020-07-01 NOTE — ED Notes (Signed)
Patient transported to X-ray 
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
## 2020-07-01 NOTE — ED Notes (Signed)
Pt transported to xray 
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
## 2020-07-01 NOTE — Progress Notes (Signed)
Patient has orders for potassium 36mEq IV x3. Paged on call provider to clarify why patient is getting potassium with level at 3.6. Dr Bevelyn Buckles stated " We want to keep her levels potassium levels above 4.

## 2020-07-02 ENCOUNTER — Encounter (HOSPITAL_COMMUNITY)
Admission: EM | Disposition: A | Discharge status: Home / Self Care | Payer: Self-pay | Source: Home / Self Care | Attending: Emergency Medicine

## 2020-07-02 ENCOUNTER — Other Ambulatory Visit (HOSPITAL_COMMUNITY): Payer: Self-pay | Admitting: Student

## 2020-07-02 ENCOUNTER — Encounter (HOSPITAL_COMMUNITY): Payer: Self-pay | Admitting: Gastroenterology

## 2020-07-02 ENCOUNTER — Observation Stay (HOSPITAL_COMMUNITY): Payer: Medicaid Other | Admitting: Anesthesiology

## 2020-07-02 DIAGNOSIS — K449 Diaphragmatic hernia without obstruction or gangrene: Secondary | ICD-10-CM | POA: Diagnosis not present

## 2020-07-02 DIAGNOSIS — Z20822 Contact with and (suspected) exposure to covid-19: Secondary | ICD-10-CM | POA: Diagnosis not present

## 2020-07-02 DIAGNOSIS — I110 Hypertensive heart disease with heart failure: Secondary | ICD-10-CM | POA: Diagnosis not present

## 2020-07-02 DIAGNOSIS — K222 Esophageal obstruction: Principal | ICD-10-CM

## 2020-07-02 DIAGNOSIS — R1310 Dysphagia, unspecified: Secondary | ICD-10-CM

## 2020-07-02 HISTORY — PX: SAVORY DILATION: SHX5439

## 2020-07-02 HISTORY — PX: ESOPHAGOGASTRODUODENOSCOPY (EGD) WITH PROPOFOL: SHX5813

## 2020-07-02 SURGERY — ESOPHAGOGASTRODUODENOSCOPY (EGD) WITH PROPOFOL
Anesthesia: Monitor Anesthesia Care

## 2020-07-02 MED ORDER — OMEPRAZOLE MAGNESIUM 20 MG PO TBEC: 20.0000 mg | DELAYED_RELEASE_TABLET | Freq: Every day | ORAL | 2 refills | Status: DC

## 2020-07-02 MED ORDER — SUCRALFATE 1 G PO TABS: 1.0000 g | ORAL_TABLET | Freq: Three times a day (TID) | ORAL | 0 refills | Status: DC

## 2020-07-02 MED ORDER — LIDOCAINE 2% (20 MG/ML) 5 ML SYRINGE
INTRAMUSCULAR | Status: DC | PRN
  Administered 2020-07-02: 60 mg via INTRAVENOUS

## 2020-07-02 MED ORDER — HYDROMORPHONE HCL 1 MG/ML IJ SOLN
0.5000 mg | Freq: Once | INTRAMUSCULAR | Status: AC | PRN
Start: 2020-07-02 — End: 2020-07-02
  Administered 2020-07-02: 0.5 mg via INTRAVENOUS
  Filled 2020-07-02: qty 1

## 2020-07-02 MED ORDER — PROPOFOL 500 MG/50ML IV EMUL
INTRAVENOUS | Status: DC | PRN
  Administered 2020-07-02: 125 ug/kg/min via INTRAVENOUS

## 2020-07-02 MED ORDER — PHENOL 1.4 % MT LIQD
1.0000 | OROMUCOSAL | Status: DC | PRN
  Filled 2020-07-02: qty 177

## 2020-07-02 MED ORDER — HYDROMORPHONE HCL 1 MG/ML IJ SOLN
0.5000 mg | Freq: Once | INTRAMUSCULAR | Status: AC
  Administered 2020-07-02: 0.5 mg via INTRAVENOUS
  Filled 2020-07-02: qty 1

## 2020-07-02 MED ORDER — LACTATED RINGERS IV SOLN: INTRAVENOUS | Status: DC | PRN

## 2020-07-02 MED ORDER — MENTHOL 3 MG MT LOZG
1.0000 | LOZENGE | OROMUCOSAL | Status: DC | PRN
  Filled 2020-07-02: qty 9

## 2020-07-02 MED ORDER — ESMOLOL HCL 100 MG/10ML IV SOLN
INTRAVENOUS | Status: DC | PRN
  Administered 2020-07-02: 10 mg via INTRAVENOUS

## 2020-07-02 MED ORDER — OXYCODONE-ACETAMINOPHEN 5-325 MG PO TABS: 1.0000 | ORAL_TABLET | ORAL | 0 refills | Status: DC | PRN

## 2020-07-02 MED ORDER — PROPOFOL 10 MG/ML IV BOLUS
INTRAVENOUS | Status: DC | PRN
  Administered 2020-07-02 (×2): 20 mg via INTRAVENOUS

## 2020-07-02 MED ORDER — MAGNESIUM SULFATE 2 GM/50ML IV SOLN
2.0000 g | Freq: Once | INTRAVENOUS | Status: AC
  Administered 2020-07-02: 2 g via INTRAVENOUS
  Filled 2020-07-02: qty 50

## 2020-07-02 MED FILL — OXYCODONE-APAP 5-325MG: 5-325 | 4 days supply | Qty: 20 | Fill #0

## 2020-07-02 MED FILL — SUCRALFATE 1 GM TAB: 1 | 30 days supply | Qty: 120 | Fill #0

## 2020-07-02 SURGICAL SUPPLY — 14 items

## 2020-07-02 NOTE — Anesthesia Postprocedure Evaluation (Signed)
Anesthesia Post Note  Patient: Kelsye Loomer  Procedure(s) Performed: ESOPHAGOGASTRODUODENOSCOPY (EGD) WITH PROPOFOL (N/A ) SAVORY DILATION (N/A ) SAVORY DILATION (N/A )     Patient location during evaluation: PACU Anesthesia Type: MAC Level of consciousness: awake and alert Pain management: pain level controlled Vital Signs Assessment: post-procedure vital signs reviewed and stable Respiratory status: spontaneous breathing, nonlabored ventilation, respiratory function stable and patient connected to nasal cannula oxygen Cardiovascular status: stable and blood pressure returned to baseline Postop Assessment: no apparent nausea or vomiting Anesthetic complications: no   No complications documented.  Last Vitals:  Vitals:   07/02/20 0854 07/02/20 0903  BP: (!) 164/95 (!) 165/94  Pulse:    Resp: 16 14  Temp:  (!) 36.3 C  SpO2: 100% 100%    Last Pain:  Vitals:   07/02/20 0854  TempSrc:   PainSc: 10-Worst pain ever                 Tiajuana Amass

## 2020-07-02 NOTE — Progress Notes (Signed)
Endo staff at bedside. Ptt GCS 15, written consent for procedure obtained from pt. Pt jewelry given to this rn, this rn placed in pt purse Pt transported to procedure by endo staff

## 2020-07-02 NOTE — Anesthesia Preprocedure Evaluation (Addendum)
Anesthesia Evaluation  Patient identified by MRN, date of birth, ID band Patient awake    Reviewed: Allergy & Precautions, H&P , NPO status , Patient's Chart, lab work & pertinent test results  Airway Mallampati: II  TM Distance: >3 FB Neck ROM: Full    Dental no notable dental hx. (+) Edentulous Upper, Edentulous Lower, Dental Advisory Given   Pulmonary neg pulmonary ROS, former smoker,    Pulmonary exam normal breath sounds clear to auscultation       Cardiovascular Exercise Tolerance: Good hypertension, Pt. on medications and Pt. on home beta blockers +CHF   Rhythm:Regular Rate:Normal     Neuro/Psych PSYCHIATRIC DISORDERS Depression Bipolar Disorder negative neurological ROS     GI/Hepatic negative GI ROS, (+) Hepatitis -, C  Endo/Other  negative endocrine ROS  Renal/GU negative Renal ROS  negative genitourinary   Musculoskeletal   Abdominal   Peds  Hematology negative hematology ROS (+)   Anesthesia Other Findings   Reproductive/Obstetrics negative OB ROS                            Anesthesia Physical  Anesthesia Plan  ASA: IV  Anesthesia Plan: MAC   Post-op Pain Management:    Induction: Intravenous  PONV Risk Score and Plan: 2 and Midazolam and Ondansetron  Airway Management Planned: Nasal Cannula  Additional Equipment:   Intra-op Plan:   Post-operative Plan:   Informed Consent: I have reviewed the patients History and Physical, chart, labs and discussed the procedure including the risks, benefits and alternatives for the proposed anesthesia with the patient or authorized representative who has indicated his/her understanding and acceptance.     Dental advisory given  Plan Discussed with: CRNA  Anesthesia Plan Comments: ( She was referred to EP for evaluation of ICD placement.  Seen by Dr. Klein 01/11/2020.  Per note she remains limited in activity, shortness of  breath at less than 100 yards.  No orthopnea or nocturnal dyspnea.  No edema.  She does also report frequent palpitations.  ICD was discussed, and patient did elect to proceed.  Event monitor was ordered to characterize palpitations, this has not been completed yet.  History hepatitis C, has been seen by infectious disease and Harvoni was planned but patient did not pick up medication.  BMP and CBC from 01/08/2020 reviewed, sodium mildly low at 132, otherwise unremarkable.  We will need day of surgery evaluation.  EKG 01/11/2020: Sinus bradycardia.  Rate 58.  TTE 12/24/2019: 1. Left ventricular ejection fraction, by estimation, is 20 to 25%. The  left ventricle has severely decreased function. The left ventricle  demonstrates global hypokinesis. Left ventricular diastolic parameters are  consistent with Grade I diastolic  dysfunction (impaired relaxation).  2. Right ventricular systolic function is mildly reduced. The right  ventricular size is normal. Tricuspid regurgitation signal is inadequate  for assessing PA pressure.  3. Left atrial size was mildly dilated.  4. The mitral valve is normal in structure. Moderate central mitral valve  regurgitation, likely functional. No evidence of mitral stenosis.  5. The aortic valve is tricuspid. Aortic valve regurgitation is not  visualized. No aortic stenosis is present.  6. The inferior vena cava is normal in size with greater than 50%  respiratory variability, suggesting right atrial pressure of 3 mmHg.  7. Consider referral to CHF clinic.   Left/right heart cath 06/25/2019: 1. No angiographic evidence of CAD 2. Elevated right heart pressures (RA 14, RV   54/8/14, PA 57/25/37, PCWP 26).  )        Anesthesia Quick Evaluation  

## 2020-07-02 NOTE — Op Note (Signed)
[facility] Patient Name: Alexandria Buck Procedure Date : 07/02/2020 MRN: [id number] Attending MD: Carol Ada , MD Date of Birth: 06-20-1960 CSN: [id number] Age: 60 Admit Type: Inpatient Procedure:                Upper GI endoscopy Indications:              Dysphagia Providers:                Carol Ada, MD, Kary Kos RN, RN, Laverda Sorenson, Technician, Junie Bame, CRNA Referring MD:              Medicines:                Propofol per Anesthesia Complications:            No immediate complications. Estimated Blood Loss:     Estimated blood loss: none. Estimated blood loss                            was minimal. Procedure:                Pre-Anesthesia Assessment:                           - Prior to the procedure, a History and Physical                            was performed, and patient medications and                            allergies were reviewed. The patient's tolerance of                            previous anesthesia was also reviewed. The risks                            and benefits of the procedure and the sedation                            options and risks were discussed with the patient.                            All questions were answered, and informed consent                            was obtained. Prior Anticoagulants: The patient has                            taken no previous anticoagulant or antiplatelet                            agents. ASA Grade Assessment: III - A patient with  severe systemic disease. After reviewing the risks                            and benefits, the patient was deemed in                            satisfactory condition to undergo the procedure.                           - Sedation was administered by an anesthesia                            professional. Deep sedation was attained.                           After obtaining informed consent, the  endoscope was                            passed under direct vision. Throughout the                            procedure, the patient's blood pressure, pulse, and                            oxygen saturations were monitored continuously. The                            GIF-H190 ([id number]) Olympus gastroscope was                            introduced through the mouth, and advanced to the                            second part of duodenum. The upper GI endoscopy was                            accomplished without difficulty. The patient                            tolerated the procedure well. Scope In: Scope Out: Findings:      Two benign-appearing, intrinsic moderate (circumferential scarring or       stenosis; an endoscope may pass) stenoses were found in the upper third       of the esophagus. The narrowest stenosis measured 9 mm (inner diameter)       x less than one cm (in length). The stenoses were traversed. A guidewire       was placed and the scope was withdrawn. Dilation was performed with a       Savary dilator with no resistance at 12 mm. The dilation site was       examined following endoscope reinsertion and showed complete resolution       of luminal narrowing. Estimated blood loss was minimal.      A 3 cm hiatal hernia was present.      The stomach was normal.  The examined duodenum was normal.      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 Gentle scope tip deflection and forward       pressure allowed for passaged of the endoscope and dilation of the web.       At approximately 25 cm another stenosis was identified. This area was       friable and eccentric and it was consistent with her site of the [name].       There was evidence of radiation changes. The area extended approximately       2 cm distally, however, the entire mid esophgeal mucosa had a       fibrotic/scarred appearance. Impression:               -  Benign-appearing esophageal stenoses. Dilated.                           - 3 cm hiatal hernia.                           - Normal stomach.                           - Normal examined duodenum.                           - No specimens collected. Recommendation:           - Return patient to hospital ward for ongoing care.                           - Resume regular diet.                           - Continue present medications.                           - Repeat upper endoscopy in 2 weeks for retreatment. Procedure Code(s):        --- Professional ---                           [phone], Esophagogastroduodenoscopy, flexible,                            transoral; with insertion of guide wire followed by                            passage of dilator(s) through esophagus over guide                            wire Diagnosis Code(s):        --- Professional ---                           K22.2, Esophageal obstruction                           K44.9, Diaphragmatic hernia without obstruction or  gangrene                           R13.10, Dysphagia, unspecified CPT copyright 2019 American Medical Association. All rights reserved. The codes documented in this report are preliminary and upon coder review may  be revised to meet current compliance requirements. Carol Ada, MD Carol Ada, MD 07/02/2020 8:50:51 AM This report has been signed electronically. Number of Addenda: 0

## 2020-07-02 NOTE — Discharge Summary (Signed)
Name: Alexandria Buck MRN: [id number] DOB: 06-18-1960 60 y.o. PCP: Ladell Pier, MD  Date of Admission: 07/01/2020  7:25 AM Date of Discharge:  07/02/2020 Attending Physician: Angelica Pou, MD  Discharge Diagnosis: 1. Odynophagia 2/2 esophageal obstruction  Discharge Medications: Allergies as of 07/02/2020   No Known Allergies     Medication List    STOP taking these medications   omeprazole 2 mg/mL Susp oral suspension Commonly known as: FIRST-Omeprazole     TAKE these medications   alum & mag hydroxide-simeth 200-200-20 MG/5ML suspension Commonly known as: MAALOX/MYLANTA Take 5 mLs by mouth every 6 (six) hours as needed for indigestion or heartburn. Ok to sub for other OTC antacid suspension What changed:   how much to take  when to take this   carvedilol 12.5 MG tablet Commonly known as: COREG Take 1 tablet (12.5 mg total) by mouth 2 (two) times daily.   Entresto 49-51 MG Generic drug: sacubitril-valsartan Take 1 tablet by mouth 2 (two) times daily.   furosemide 20 MG tablet Commonly known as: LASIX Take 1 tablet (20 mg total) by mouth every other day.   lithium carbonate 150 MG capsule Take 150 mg by mouth 2 (two) times daily with a meal.   multivitamin tablet Take 1 tablet by mouth daily.   nitroGLYCERIN 0.4 MG SL tablet Commonly known as: NITROSTAT Place 1 tablet (0.4 mg total) under the tongue every 5 (five) minutes as needed for chest pain.   omeprazole 20 MG tablet Commonly known as: PriLOSEC OTC Take 1 tablet (20 mg total) by mouth daily.   oxyCODONE-acetaminophen 5-325 MG tablet Commonly known as: Percocet Take 1 tablet by mouth every 4 (four) hours as needed for severe pain.   potassium chloride SA 20 MEQ tablet Commonly known as: KLOR-CON TAKE 1 TABLET BY MOUTH ONCE A DAY   predniSONE 10 MG tablet Commonly known as: DELTASONE Take 1 tablet (10 mg total) by mouth daily with breakfast.   prochlorperazine 5 MG  tablet Commonly known as: COMPAZINE Take 1 tablet (5 mg total) by mouth every 6 (six) hours as needed for nausea or vomiting.   spironolactone 25 MG tablet Commonly known as: ALDACTONE Take 0.5 tablets (12.5 mg total) by mouth daily.   sucralfate 1 g tablet Commonly known as: Carafate Take 1 tablet (1 g total) by mouth 4 (four) times daily -  with meals and at bedtime.       Disposition and follow-up:   Alexandria Buck was discharged from [facility] in Stable condition.  At the hospital follow up visit please address:  1.  Odynophagia 2/2 esophageal obstruction. Underwent endoscopy and dilation by [facility], with plan to repeat endoscopy in 2 weeks from now. Discharging home with PO carafate, PO omeprazole, and pain management with 3-day supply of percocet q4h prn.  2.  Labs / imaging needed at time of follow-up: CBC, CMP  3.  Pending labs/ test needing follow-up: None  Follow-up Appointments:  Follow-up Information    [facility] Follow up.   Specialty: Gastroenterology Why: Call to arrange follow up with [facility]. Plans for endoscopy in 2 weeks.  Contact information: [place] [id number] [place] [facility] Course by problem list: 1. Odynophagia 2/2 esophageal obstruction.  Patient presenting with dysphagia, odynophagia, weight loss. GI consulted, appreciate recommendations. Prior endoscopy showed significant narrowing of proximal esophagus requiring  dilation in the mid esophagus and esophageal cancer consistent with squamous cell, along with a repeat dilatation showing presence of an esophageal stricture in the proximal esophagus. Initially, greatest concern for radiation esophagitis given recent completion of chemotherapy and radiation for esophageal carcinoma. There was plan for repeat endoscopy next month, but due to severe findings on presentation. Administered GI cocktail  and PPI therapy. Pain control with IV dilaudid 0.5mg  q3h prn. Maintenance fluids with LR @100cc /hr. Continued prednisone 10mg  for appetite stimulation as per patient. Performed endoscopy with dilation of stenoses, no biopsies collected. There was another area of stenosis more distally, but this area was friable and consistent with radiation changes. [facility] with plan to repeat endoscopy in 2 weeks for repeat treatment. Resumed patient on regular diet, which she tolerated. Will discharge with PO carafate, PO omeprazole, and 3-day supply of percocets q4h prn. Advised to get OTC lozenges to help soothe throat.  Discharge Exam:   BP (!) 148/88 (BP Location: Right Arm)   Pulse (!) 58   Temp 98.8 F (37.1 C) (Oral)   Resp 17   LMP  (LMP Unknown)   SpO2 100%  Discharge exam:  General: elderly female, sitting up in bed, chronically ill-appearing, NAD. CV: normal rate and regular rhythm, no m/r/g. Chest: CTABL, no adventitious sounds noted. No crepitus on chest palpation and no TTP over lower esophagus.  Skin: warm and dry Neuro: AAOx3, no focal deficits.   Pertinent Labs, Studies, and Procedures:  CBC Latest Ref Rng & Units 07/01/2020 06/07/2020 04/29/2020  WBC 4.0 - 10.5 K/uL 3.4(L) 6.0 4.0  Hemoglobin 12.0 - 15.0 g/dL 12.4 12.6 10.3(L)  Hematocrit 36.0 - 46.0 % 38.8 38.2 31.4(L)  Platelets 150 - 400 K/uL 270 294 298   CMP Latest Ref Rng & Units 07/01/2020 06/07/2020 06/06/2020  Glucose 70 - 99 mg/dL 76 80 95  BUN 8 - 23 mg/dL 9 14 11   Creatinine 0.44 - 1.00 mg/dL 0.83 0.96 0.85  Sodium 135 - 145 mmol/L 141 142 139  Potassium 3.5 - 5.1 mmol/L 3.6 3.8 4.0  Chloride 98 - 111 mmol/L 115(H) 112(H) 105  CO2 22 - 32 mmol/L 23 21(L) 18(L)  Calcium 8.9 - 10.3 mg/dL 8.7(L) 8.8(L) 9.5  Total Protein 6.5 - 8.1 g/dL 6.7 - 7.8  Total Bilirubin 0.3 - 1.2 mg/dL 0.2(L) - <0.2  Alkaline Phos 38 - 126 U/L 51 - 71  AST 15 - 41 U/L 50(H) - 85(H)  ALT 0 - 44 U/L 21 - 62(H)   Urinalysis    Component Value  Date/Time   COLORURINE YELLOW 07/01/2020 0807   APPEARANCEUR CLEAR 07/01/2020 0807   LABSPEC 1.018 07/01/2020 0807   PHURINE 5.0 07/01/2020 0807   GLUCOSEU NEGATIVE 07/01/2020 0807   HGBUR NEGATIVE 07/01/2020 0807   BILIRUBINUR NEGATIVE 07/01/2020 0807   BILIRUBINUR negative 02/20/2019 1533   KETONESUR NEGATIVE 07/01/2020 0807   PROTEINUR NEGATIVE 07/01/2020 0807   UROBILINOGEN 0.2 02/20/2019 1533   UROBILINOGEN 0.2 07/13/2009 1612   NITRITE NEGATIVE 07/01/2020 0807   LEUKOCYTESUR NEGATIVE 07/01/2020 0807    Lipase     Component Value Date/Time   LIPASE 24 07/01/2020 0755   BNP    Component Value Date/Time   BNP 98.5 07/01/2020 0755   Troponins 12 > 11  D-dimer < 0.27  DG Chest 2 View  Result Date: 07/01/2020 CLINICAL DATA:  Chest pain EXAM: CHEST - 2 VIEW COMPARISON:  March 20, 2020 chest radiograph and CT angiogram chest FINDINGS: Nipple shadows noted  bilaterally. Bullous disease noted in the upper lobes. No edema or airspace opacity. Heart is upper normal in size with pulmonary vascularity normal. No adenopathy. No bone lesions IMPRESSION: Upper lobe bullous disease bilaterally. No edema or airspace opacity. Heart upper normal in size. Electronically Signed   By: Lowella Grip III M.D.   On: 07/01/2020 08:37    Discharge Instructions: Discharge Instructions    Diet - low sodium heart healthy   Complete by: As directed    Increase activity slowly   Complete by: As directed       Signed: Virl Axe, MD 07/02/2020, 12:36 PM   Pager: [phone]

## 2020-07-02 NOTE — Transfer of Care (Signed)
Immediate Anesthesia Transfer of Care Note  Patient: Alexandria Buck  Procedure(s) Performed: ESOPHAGOGASTRODUODENOSCOPY (EGD) WITH PROPOFOL (N/A ) SAVORY DILATION (N/A ) SAVORY DILATION (N/A )  Patient Location: PACU  Anesthesia Type:MAC  Level of Consciousness: awake, drowsy and patient cooperative  Airway & Oxygen Therapy: Patient Spontanous Breathing and Patient connected to nasal cannula oxygen  Post-op Assessment: Report given to RN, Post -op Vital signs reviewed and stable and Patient moving all extremities  Post vital signs: Reviewed and stable  Last Vitals:  Vitals Value Taken Time  BP 116/66 07/02/20 0839  Temp    Pulse 86   Resp 11 07/02/20 0839  SpO2 100   Vitals shown include unvalidated device data.  Last Pain:  Vitals:   07/02/20 0749  TempSrc: Temporal  PainSc: 4          Complications: No complications documented.

## 2020-07-02 NOTE — Discharge Instructions (Signed)
Ms. Hutmacher,  You came in for evaluation of pain with swallowing.  The gastroenterologist ( [facility]) found stenotic areas in your esophagus and were able to dilate these areas. The GI doctors are recommended a repeat Endoscopy in 2 weeks to evaluate for return of these stenotic areas.

## 2020-07-02 NOTE — Anesthesia Procedure Notes (Signed)
Procedure Name: MAC Date/Time: 07/02/2020 8:18 AM Performed by: Rande Brunt, CRNA Pre-anesthesia Checklist: Patient identified, Emergency Drugs available, Suction available and Patient being monitored Patient Re-evaluated:Patient Re-evaluated prior to induction Oxygen Delivery Method: Nasal cannula Induction Type: IV induction Placement Confirmation: positive ETCO2 and CO2 detector

## 2020-07-02 NOTE — Progress Notes (Addendum)
Patient called  5 minutes after I gave PRN dilaudid that she was in severe pain. I informed patient to allow the medication to work since I just it to her. I later went at 0417 to check on her if her pain has subsided and she wanted additional pain meds, so I can call her on call provider. She said informed me her pain was ok. She later called 0540 for additional pain meds. I informed her she will have  to give me some few minutes to inform her Doctor. MD called and spoke with patient. When I went to give patient her one time dose ordered by her MD , she informed me MD ordered her a double dose. I told patient the medication I am giving her , is exactly what she has been receiving her previous administrations. She got upset and stated " I don't think you gave me my previous meds that's why I am hurting so much". I informed patient she is receiving exactly what the MD ordered.

## 2020-07-02 NOTE — Progress Notes (Signed)
Mds at bedside. Pt reporting back chest and throat discomfort/pain "feels better"

## 2020-07-02 NOTE — Interval H&P Note (Signed)
History and Physical Interval Note:  07/02/2020 8:07 AM  Alexandria Buck  has presented today for surgery, with the diagnosis of Odynophagia.  Chest, esophageal pain.  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  The various methods of treatment have been discussed with the patient and family. After consideration of risks, benefits and other options for treatment, the patient has consented to  Procedure(s): ESOPHAGOGASTRODUODENOSCOPY (EGD) WITH PROPOFOL (N/A) SAVORY DILATION (N/A) as a surgical intervention.  The patient's history has been reviewed, patient examined, no change in status, stable for surgery.  I have reviewed the patient's chart and labs.  Questions were answered to the patient's satisfaction.     Alexandria Buck D

## 2020-07-02 NOTE — Progress Notes (Signed)
   Subjective:   No acute overnight events.  Reports having an episode of throat pain overnight, resolved after receiving pain medications and she was able to get sleep afterwards. Her pain is a lot better controlled this morning. Ready for endoscopy today.  Objective:  Vital signs in last 24 hours: Vitals:   07/02/20 0840 07/02/20 0854 07/02/20 0903 07/02/20 0930  BP: 116/66 (!) 164/95 (!) 165/94 (!) 151/101  Pulse:    (!) 58  Resp: 16 16 14 16   Temp: (!) 97.4 F (36.3 C)  (!) 97.4 F (36.3 C)   TempSrc:      SpO2: 100% 100% 100% 100%   General: elderly female, chronically ill-appearing, lying in bed, NAD. HENT: minimal TTP around lower esophagus.  CV: slightly bradycardic rate with regular rhythm, no m/r/g. Chest: no crepitus on palpation. Pulm: CTABL, no adventitious sounds noted.   Assessment/Plan:  Active Problems:   Odynophagia  Patient presented with dysphagia, odynophagia, and weight loss. GI following, appreciate recommendations. Performed endoscopy with dilation of stenoses, no biopsies collected. There was another area of stenosis more distally, but this area was friable and consistent with radiation changes. Plan to repeat endoscopy in 2 weeks for repeat treatment. Will resume patient on regular diet and continue with pain management.  Prior to Admission Living Arrangement: Home Anticipated Discharge Location: TBD Barriers to Discharge: continued medical management Dispo: Anticipated discharge in approximately 1-2 day(s).   Virl Axe, MD 07/02/2020, 10:03 AM Pager: [phone] After 5pm on weekdays and 1pm on weekends: On Call pager [phone]

## 2020-07-02 NOTE — Progress Notes (Addendum)
Pt back from endo procedure, pt GCS 15 and fully awake. Pt conts to report back chest and throat pain/discomfort. Pt appears in no distress, pt wanting pain med but is aware and understands pain med due at 12. Placed bed alarms on

## 2020-07-02 NOTE — Progress Notes (Signed)
Discharge instructions reviewed and given to pt, pt acknowledge understanding. Pt tolerated hamburger meal for lunch, pt denied any swallowing difficulty.

## 2020-07-04 ENCOUNTER — Telehealth: Payer: Self-pay

## 2020-07-04 NOTE — Telephone Encounter (Signed)
Patient is returning a call to Opal Sidles, Therapist, sports.  Tried 2x to reach her but was not available.  Please return call to patient at [phone]

## 2020-07-04 NOTE — Telephone Encounter (Signed)
Transition Care Management Unsuccessful Follow-up Telephone Call  Date of discharge and from where:  07/02/2020, [facility]  Attempts:  1st Attempt  Reason for unsuccessful TCM follow-up call:  Left voice message # [phone].   Need to discuss scheduling a follow up appointment with PCP

## 2020-07-05 ENCOUNTER — Telehealth: Payer: Self-pay

## 2020-07-05 ENCOUNTER — Other Ambulatory Visit: Payer: Self-pay | Admitting: Student

## 2020-07-05 NOTE — Telephone Encounter (Signed)
Transition Care Management Unsuccessful Follow-up Telephone Call  Date of discharge and from where:  07/02/2020, [facility]  Attempts:  2nd Attempt  Reason for unsuccessful TCM follow-up call:  Left voice message on # [phone].   Need to discuss scheduling a follow up appointment with PCP

## 2020-07-06 ENCOUNTER — Telehealth: Payer: Self-pay

## 2020-07-06 NOTE — Telephone Encounter (Signed)
Transition Care Management Follow-up Telephone Call  Date of discharge and from where:07/02/2020, [facility]   How have you been since you were released from the hospital? She said that her throat is sore; but she is doing better eating.  She has been trying all types of food and said that if something is hard to swallow, she will return to a soft diet.   Any questions or concerns? No - she explained that she was very glad when the doctor told her that he didn't see any cancer during her procedure.   She explained that she has applied for medicaid through [employer].  Her monthly income is approximately $2000.   Items Reviewed:  Did the pt receive and understand the discharge instructions provided? Yes   Medications obtained and verified? No  - she does not have any medications. She said that she will be picking them up on Friday 07/08/2020 when she receives her checks. This CM stressed the importance of getting all of the medications, not just the pain meds.   Other? No   Any new allergies since your discharge? No   Dietary orders reviewed? Yes  Do you have support at home? Yes ,she lives with her boyfriend and has children who live in the area.  Home Care and Equipment/Supplies: Were home health services ordered? no If so, what is the name of the agency? n/a  Has the agency set up a time to come to the patient's home? not applicable Were any new equipment or medical supplies ordered?  No What is the name of the medical supply agency? n/a Were you able to get the supplies/equipment? not applicable Do you have any questions related to the use of the equipment or supplies? No  Functional Questionnaire: (I = Independent and D = Dependent) ADLs:independent  Follow up appointments reviewed:   PCP Hospital f/u appt confirmed? Yes  - Dr Wynetta Emery 07/18/2020.   [facility] f/u appt confirmed? Yes  - GI 07/27/2020.    Are transportation arrangements needed? No   If  their condition worsens, is the pt aware to call PCP or go to the Emergency Dept.? Yes  Was the patient provided with contact information for the PCP's office or ED? Yes  Was to pt encouraged to call back with questions or concerns? Yes

## 2020-07-06 NOTE — Telephone Encounter (Incomplete)
Transition Care Management Follow-up Telephone Call  Date of discharge and from where:07/02/2020, [facility]   How have you been since you were released from the hospital? She said that her throat is sore; but she is doing better eating.  She has been trying all types of food and said that if something is hard to swallow, she will return to a soft diet.   Any questions or concerns? No - she explained that she was very glad when the doctor told her that he didn't see any cancer during her procedure.   Items Reviewed:  Did the pt receive and understand the discharge instructions provided? Yes   Medications obtained and verified? {YES/NO:21197}  Other? No   Any new allergies since your discharge? No   Dietary orders reviewed? Yes  Do you have support at home? Yes ,she lives with her boyfriend and has children who live in the area.  Home Care and Equipment/Supplies: Were home health services ordered? no If so, what is the name of the agency? n/a  Has the agency set up a time to come to the patient's home? not applicable Were any new equipment or medical supplies ordered?  No What is the name of the medical supply agency? n/a Were you able to get the supplies/equipment? not applicable Do you have any questions related to the use of the equipment or supplies? No  Functional Questionnaire: (I = Independent and D = Dependent) ADLs:independent  Follow up appointments reviewed:   PCP Hospital f/u appt confirmed? Yes  - Dr Wynetta Emery 07/18/2020.   [facility] f/u appt confirmed? Yes  - GI 07/27/2020.    Are transportation arrangements needed? No   If their condition worsens, is the pt aware to call PCP or go to the Emergency Dept.? Yes  Was the patient provided with contact information for the PCP's office or ED? Yes  Was to pt encouraged to call back with questions or concerns? Yes

## 2020-07-08 NOTE — Telephone Encounter (Signed)
Need refill on oxyCODONE-acetaminophen (PERCOCET) 5-325 MG tablet  ;pt contact [place], [place]

## 2020-07-11 ENCOUNTER — Telehealth: Payer: Self-pay | Admitting: Internal Medicine

## 2020-07-11 ENCOUNTER — Other Ambulatory Visit (HOSPITAL_COMMUNITY): Payer: Self-pay

## 2020-07-11 ENCOUNTER — Other Ambulatory Visit: Payer: Self-pay | Admitting: Internal Medicine

## 2020-07-11 ENCOUNTER — Other Ambulatory Visit: Payer: Self-pay | Admitting: Student

## 2020-07-11 ENCOUNTER — Telehealth: Payer: Self-pay

## 2020-07-11 NOTE — Telephone Encounter (Signed)
Medication: Rx #: [id number] oxyCODONE-acetaminophen (PERCOCET/ROXICET) 5-325 MG tablet [[id number]] Apt 07/18/20   Patient contacted their pharmacy? YES  (Agent: If no, request that the patient contact the pharmacy for the refill.) (Agent: If yes, when and what did the pharmacy advise?)  Preferred Pharmacy (with phone number or street name): [place] [address] Phone: [phone] Fax: [phone] Hours: M-F 7:30am-6pm    Agent: Please be advised that RX refills may take up to 3 business days. We ask that you follow-up with your pharmacy.

## 2020-07-11 NOTE — Telephone Encounter (Signed)
Pt is requesting heroxyCODONE-acetaminophen (PERCOCET/ROXICET) 5-325 MG tablet sent to  [facility] Phone:  [phone]  Fax:  [phone]

## 2020-07-11 NOTE — Telephone Encounter (Signed)
Patient sees a provider at [facility]. Discussed with FO. Hme states she spoke with patient this AM and provided number to CHW and transferred her. Lelon Frohlich will call patient back to contact CHW for refill request.

## 2020-07-11 NOTE — Telephone Encounter (Signed)
Requested medication (s) are due for refill today: yes  Requested medication (s) are on the active medication list: yes  Last refill: 07/02/2020  Future visit scheduled:yes  Notes to clinic:  this refill cannot  be delegated    Requested Prescriptions  Pending Prescriptions Disp Refills   oxyCODONE-acetaminophen (PERCOCET/ROXICET) 5-325 MG tablet 20 tablet 0    Sig: TAKE 1 TABLET BY MOUTH EVERY 4 HOURS AS NEEDED FOR SEVERE PAIN.      There is no refill protocol information for this order

## 2020-07-11 NOTE — Telephone Encounter (Signed)
patient checking on the status of medication request mentioned below. Patient states request was sent in on Friday 07/08/2020 and would like request expedited.   [facility] Phone:  [phone]  Fax:  [phone]

## 2020-07-12 ENCOUNTER — Other Ambulatory Visit (HOSPITAL_COMMUNITY): Payer: Self-pay

## 2020-07-12 NOTE — Telephone Encounter (Signed)
Patient is calling again regarding her pain medication.  Patient stated she has a lot of pain and needs her pain medication as soon as possible.  Please advise and send to pharmacy.  If there is a problem, call patient at [phone]

## 2020-07-14 ENCOUNTER — Other Ambulatory Visit (HOSPITAL_COMMUNITY): Payer: Self-pay

## 2020-07-14 ENCOUNTER — Other Ambulatory Visit: Payer: Self-pay | Admitting: Student

## 2020-07-14 NOTE — Telephone Encounter (Signed)
Pt is calling in inquiring about med. States she was called by CHW today and the caller would not speak as she kept saying Hello, tried to explain it may have been a miscall but she wants a cb from who called her. No documentation. FU as upset about her meds. [phone]

## 2020-07-14 NOTE — Telephone Encounter (Signed)
Patient was on our in-patient service only. Has PCP at [facility] Please refuse as Triage is unable to. Thank you.

## 2020-07-15 ENCOUNTER — Other Ambulatory Visit (HOSPITAL_COMMUNITY): Payer: Self-pay

## 2020-07-18 ENCOUNTER — Ambulatory Visit: Payer: Self-pay | Attending: Internal Medicine | Admitting: Internal Medicine

## 2020-07-18 ENCOUNTER — Other Ambulatory Visit: Payer: Self-pay

## 2020-07-18 ENCOUNTER — Other Ambulatory Visit (HOSPITAL_COMMUNITY): Payer: Self-pay

## 2020-07-18 DIAGNOSIS — M546 Pain in thoracic spine: Secondary | ICD-10-CM

## 2020-07-18 DIAGNOSIS — Z09 Encounter for follow-up examination after completed treatment for conditions other than malignant neoplasm: Secondary | ICD-10-CM

## 2020-07-18 DIAGNOSIS — R1310 Dysphagia, unspecified: Secondary | ICD-10-CM

## 2020-07-18 MED ORDER — TRAMADOL HCL 50 MG PO TABS
50.0000 mg | ORAL_TABLET | Freq: Two times a day (BID) | ORAL | 0 refills | Status: DC | PRN
  Filled 2020-07-18: qty 40, 20d supply, fill #0

## 2020-07-18 NOTE — Progress Notes (Signed)
Pt states she is needing her pain medication because she is unable to deal with the pain
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
## 2020-07-19 NOTE — Telephone Encounter (Signed)
We typically do not prescribe Percocet. Additionally, this pt is on Tramadol. I will forward this information to patient's PCP so she is aware.

## 2020-07-27 ENCOUNTER — Ambulatory Visit (INDEPENDENT_AMBULATORY_CARE_PROVIDER_SITE_OTHER)
Admission: RE | Admit: 2020-07-27 | Discharge: 2020-07-27 | Disposition: A | Discharge status: Home / Self Care | Payer: Self-pay | Source: Ambulatory Visit | Attending: Gastroenterology | Admitting: Gastroenterology

## 2020-07-27 ENCOUNTER — Ambulatory Visit (INDEPENDENT_AMBULATORY_CARE_PROVIDER_SITE_OTHER): Payer: Self-pay | Admitting: Gastroenterology

## 2020-07-27 ENCOUNTER — Encounter: Payer: Self-pay | Admitting: Gastroenterology

## 2020-07-27 ENCOUNTER — Emergency Department (HOSPITAL_COMMUNITY)
Admission: EM | Admit: 2020-07-27 | Discharge: 2020-07-28 | Disposition: A | Discharge status: Home / Self Care | Payer: Medicaid Other | Attending: Emergency Medicine | Admitting: Emergency Medicine

## 2020-07-27 ENCOUNTER — Other Ambulatory Visit: Payer: Self-pay

## 2020-07-27 VITALS — BP 130/90 | HR 96 | Ht 61.75 in | Wt 82.4 lb

## 2020-07-27 DIAGNOSIS — R109 Unspecified abdominal pain: Secondary | ICD-10-CM | POA: Diagnosis not present

## 2020-07-27 DIAGNOSIS — I110 Hypertensive heart disease with heart failure: Secondary | ICD-10-CM | POA: Diagnosis not present

## 2020-07-27 DIAGNOSIS — C159 Malignant neoplasm of esophagus, unspecified: Secondary | ICD-10-CM

## 2020-07-27 DIAGNOSIS — Z87891 Personal history of nicotine dependence: Secondary | ICD-10-CM | POA: Diagnosis not present

## 2020-07-27 DIAGNOSIS — R1013 Epigastric pain: Secondary | ICD-10-CM

## 2020-07-27 DIAGNOSIS — C154 Malignant neoplasm of middle third of esophagus: Secondary | ICD-10-CM

## 2020-07-27 DIAGNOSIS — M549 Dorsalgia, unspecified: Secondary | ICD-10-CM

## 2020-07-27 DIAGNOSIS — K222 Esophageal obstruction: Secondary | ICD-10-CM

## 2020-07-27 DIAGNOSIS — I5021 Acute systolic (congestive) heart failure: Secondary | ICD-10-CM | POA: Diagnosis not present

## 2020-07-27 DIAGNOSIS — R1310 Dysphagia, unspecified: Secondary | ICD-10-CM | POA: Diagnosis not present

## 2020-07-27 DIAGNOSIS — R1319 Other dysphagia: Secondary | ICD-10-CM

## 2020-07-27 DIAGNOSIS — R634 Abnormal weight loss: Secondary | ICD-10-CM

## 2020-07-27 DIAGNOSIS — M546 Pain in thoracic spine: Secondary | ICD-10-CM | POA: Diagnosis present

## 2020-07-27 DIAGNOSIS — G8929 Other chronic pain: Secondary | ICD-10-CM

## 2020-07-27 NOTE — Patient Instructions (Addendum)
Your provider has requested that you have an chest x-ray  And Thoracic spine xray before leaving today. Please go to the basement floor to our Radiology department for the test.   You have been scheduled for an abdominal ultrasound at [facility] (1st floor of hospital) on 08/03/20 at 10:30am. Please arrive 15 minutes prior to your appointment for registration. Make certain not to have anything to eat or drink 6 hours prior to your appointment. Should you need to reschedule your appointment, please contact radiology at [phone]. This test typically takes about 30 minutes to perform.  It has been recommended to you by your physician that you have a(n) Endo with dilation at the hospital  completed. Per your request, we did not schedule the procedure(s) today. Please call office and ask for Oda Placke at [phone] once you have someone that come with you for your procedure.   If you are age 62 or older, your body mass index should be between 23-30. Your Body mass index is 15.19 kg/m. If this is out of the aforementioned range listed, please consider follow up with your Primary Care Provider.  If you are age 39 or younger, your body mass index should be between 19-25. Your Body mass index is 15.19 kg/m. If this is out of the aformentioned range listed, please consider follow up with your Primary Care Provider.    Due to recent changes in healthcare laws, you may see the results of your imaging and laboratory studies on MyChart before your provider has had a chance to review them.  We understand that in some cases there may be results that are confusing or concerning to you. Not all laboratory results come back in the same time frame and the provider may be waiting for multiple results in order to interpret others.  Please give [place] 48 hours in order for your provider to thoroughly review all the results before contacting the office for clarification of your results.   Thank you for choosing me  and [facility].  Dr. Rush Landmark

## 2020-07-27 NOTE — ED Triage Notes (Addendum)
Brought in by [facility] - c/o lower back for a few weeks but got worse today doctor advised her to come to ED for evaluation. "Pt is supposed to get needles on her back"  Pt reports she has abdominal pain everytime she eats to the point she has lost of weight.

## 2020-07-27 NOTE — ED Triage Notes (Signed)
Emergency Medicine Provider Triage Evaluation Note  Alexandria Buck , a 60 y.o. female  was evaluated in triage.  Pt complains of back pain for several months that has been worsening- mid back below shoulder blades, worse with movement. Alleviated by oxycodone she had previously somewhat, not taking anything for pain right now. No recent injury/change in activity. Had x-rays by her outpatient doctor today who she states told her to come to the ED if pain got worse. Also mentions upper abdominal pain x 1 week with decreased appetite.   Review of Systems  Positive: Back pain, abdominal pain Negative: Numbness, paresthesias, incontinence, retention, dysuria, fever, or chills.   Physical Exam  BP (!) 150/104   Pulse 92   Temp 98.8 F (37.1 C) (Oral)   Resp 16   Ht 5\' 1"  (1.549 m)   Wt 37.4 kg   LMP  (LMP Unknown)   SpO2 100%   BMI 15.58 kg/m  Gen:   Awake, no distress, thin appearing HEENT:  Atraumatic Resp:  Normal effort  Cardiac:  Normal rate  Abd:   Nondistended, mild epigastric tenderness to palpation.  MSK:   Midline thoracic & bilateral paraspinal muscle tenderness to palpation.  Neuro:  Speech clear. Sensation grossly intact to lower extremities. 5/5 strength with plantar/dorsiflexion bilaterally   Medical Decision Making  Medically screening exam initiated at 11:44 PM.  Appropriate orders placed.  Alexandria Buck was informed that the remainder of the evaluation will be completed by another provider, this initial triage assessment does not replace that evaluation, and the importance of remaining in the ED until their evaluation is complete.  Clinical Impression  Back pain   [name], PA-C 07/27/20 2347

## 2020-07-28 ENCOUNTER — Other Ambulatory Visit: Payer: Self-pay

## 2020-07-28 ENCOUNTER — Emergency Department (HOSPITAL_COMMUNITY): Payer: Medicaid Other

## 2020-07-28 ENCOUNTER — Other Ambulatory Visit (HOSPITAL_COMMUNITY): Payer: Self-pay

## 2020-07-28 LAB — URINALYSIS, ROUTINE W REFLEX MICROSCOPIC
Bilirubin Urine: NEGATIVE
Glucose, UA: NEGATIVE mg/dL
Hgb urine dipstick: NEGATIVE
Ketones, ur: 20 mg/dL — AB
Leukocytes,Ua: NEGATIVE
Nitrite: NEGATIVE
Protein, ur: 30 mg/dL — AB
Specific Gravity, Urine: 1.03 (ref 1.005–1.030)
pH: 5 (ref 5.0–8.0)

## 2020-07-28 LAB — CBC WITH DIFFERENTIAL/PLATELET
Abs Immature Granulocytes: 0.05 10*3/uL (ref 0.00–0.07)
Basophils Absolute: 0 10*3/uL (ref 0.0–0.1)
Basophils Relative: 0 %
Eosinophils Absolute: 0 10*3/uL (ref 0.0–0.5)
Eosinophils Relative: 1 %
HCT: 38.8 % (ref 36.0–46.0)
Hemoglobin: 12.3 g/dL (ref 12.0–15.0)
Immature Granulocytes: 1 %
Lymphocytes Relative: 24 %
Lymphs Abs: 1.3 10*3/uL (ref 0.7–4.0)
MCH: 29.3 pg (ref 26.0–34.0)
MCHC: 31.7 g/dL (ref 30.0–36.0)
MCV: 92.4 fL (ref 80.0–100.0)
Monocytes Absolute: 0.7 10*3/uL (ref 0.1–1.0)
Monocytes Relative: 13 %
Neutro Abs: 3.2 10*3/uL (ref 1.7–7.7)
Neutrophils Relative %: 61 %
Platelets: 243 10*3/uL (ref 150–400)
RBC: 4.2 MIL/uL (ref 3.87–5.11)
RDW: 13.2 % (ref 11.5–15.5)
WBC: 5.3 10*3/uL (ref 4.0–10.5)
nRBC: 0 % (ref 0.0–0.2)

## 2020-07-28 LAB — COMPREHENSIVE METABOLIC PANEL
ALT: 9 U/L (ref 0–44)
AST: 17 U/L (ref 15–41)
Albumin: 3.6 g/dL (ref 3.5–5.0)
Alkaline Phosphatase: 50 U/L (ref 38–126)
Anion gap: 12 (ref 5–15)
BUN: 16 mg/dL (ref 8–23)
CO2: 23 mmol/L (ref 22–32)
Calcium: 9.3 mg/dL (ref 8.9–10.3)
Chloride: 103 mmol/L (ref 98–111)
Creatinine, Ser: 1.03 mg/dL — ABNORMAL HIGH (ref 0.44–1.00)
GFR, Estimated: >60 mL/min (ref >60)
Glucose, Bld: 70 mg/dL (ref 70–99)
Potassium: 3.1 mmol/L — ABNORMAL LOW (ref 3.5–5.1)
Sodium: 138 mmol/L (ref 135–145)
Total Bilirubin: 0.7 mg/dL (ref 0.3–1.2)
Total Protein: 7.7 g/dL (ref 6.5–8.1)

## 2020-07-28 LAB — TROPONIN I (HIGH SENSITIVITY): Troponin I (High Sensitivity): 15 ng/L (ref <18)

## 2020-07-28 LAB — LIPASE, BLOOD: Lipase: 20 U/L (ref 11–51)

## 2020-07-28 MED ORDER — SODIUM CHLORIDE 0.9 % IV BOLUS
1000.0000 mL | Freq: Once | INTRAVENOUS | Status: AC
  Administered 2020-07-28: 1000 mL via INTRAVENOUS

## 2020-07-28 MED ORDER — POLYETHYLENE GLYCOL 3350 17 GM/SCOOP PO POWD
17.0000 g | Freq: Every day | ORAL | 1 refills | Status: AC
  Filled 2020-07-28: qty 238, 14d supply, fill #0

## 2020-07-28 MED ORDER — HYDROMORPHONE HCL 1 MG/ML IJ SOLN
1.0000 mg | Freq: Once | INTRAMUSCULAR | Status: AC
  Administered 2020-07-28: 1 mg via INTRAVENOUS
  Filled 2020-07-28: qty 1

## 2020-07-28 MED ORDER — OXYCODONE HCL 5 MG PO TABS
5.0000 mg | ORAL_TABLET | ORAL | 0 refills | Status: DC | PRN
  Filled 2020-07-28: qty 15, 3d supply, fill #0

## 2020-07-28 MED ORDER — IOHEXOL 300 MG/ML  SOLN
75.0000 mL | Freq: Once | INTRAMUSCULAR | Status: AC | PRN
  Administered 2020-07-28: 75 mL via INTRAVENOUS

## 2020-07-28 NOTE — ED Notes (Addendum)
Patient transported to CT 
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
## 2020-07-28 NOTE — ED Notes (Signed)
Returned from [place]. States relief of pain. Pt resting in bed @ this time w/ NAD noted. VSS. Cardiac monitoring in place w/ bp cycling and spo2 being monitored. Updated on plan of care. Deny needs or concerns @ this time. Bed low and locked.

## 2020-07-28 NOTE — ED Notes (Signed)
Pt resting in bed @ this time w/ NAD noted. VSS. Cardiac monitoring in place w/ bp cycling and spo2 being monnitored. Asking for pain meds, made aware awaiting eval by provider. Updated on plan of care. Deny needs or concerns @ this time. Bed low and locked. Side rails up x2.

## 2020-07-28 NOTE — ED Notes (Signed)
Medications follow up appts reviewed w/ pt. Denies questions or concerns @ this time. Education on s/s of worsening and when to return. Tx for back pain w/. Pain medcation states relief. states relief @ this time. Left I n wheelchair, NAD.PIV removed and VSS.

## 2020-07-28 NOTE — ED Notes (Signed)
ED Provider at bedside. 

## 2020-07-29 ENCOUNTER — Telehealth: Payer: Self-pay | Admitting: Internal Medicine

## 2020-07-29 ENCOUNTER — Ambulatory Visit: Payer: Self-pay | Admitting: *Deleted

## 2020-07-29 NOTE — Telephone Encounter (Signed)
Patient state she was treated for muscle spasm- ER- 4/20. Patient was given oxycodone- is helping symptoms. Patient is requesting muscle spasm medication- Tramadol. [facility]. Patient states she was told she needed to be on this as soon as possible- for spasm relief.   Reason for Disposition . Caller requesting a CONTROLLED substance prescription refill (e.g., narcotics, ADHD medicines)  Answer Assessment - Initial Assessment Questions 1. ONSET: "When did the pain begin?"      Chronic back pain 2. LOCATION: "Where does it hurt?" (upper, mid or lower back)     Shoulder blade to chest-R  3. SEVERITY: "How bad is the pain?"  (e.g., Scale 1-10; mild, moderate, or severe)   - MILD (1-3): doesn't interfere with normal activities    - MODERATE (4-7): interferes with normal activities or awakens from sleep    - SEVERE (8-10): excruciating pain, unable to do any normal activities      No pain now- taking pain medication 4. PATTERN: "Is the pain constant?" (e.g., yes, no; constant, intermittent)      Constant when not on medication 5. RADIATION: "Does the pain shoot into your legs or elsewhere?"     Travels to the front 6. CAUSE:  "What do you think is causing the back pain?"      Back spasm 7. BACK OVERUSE:  "Any recent lifting of heavy objects, strenuous work or exercise?"     n/a 8. MEDICATIONS: "What have you taken so far for the pain?" (e.g., nothing, acetaminophen, NSAIDS)     oxycodone 9. NEUROLOGIC SYMPTOMS: "Do you have any weakness, numbness, or problems with bowel/bladder control?"     Patient is taking medication for her bowels 10. OTHER SYMPTOMS: "Do you have any other symptoms?" (e.g., fever, abdominal pain, burning with urination, blood in urine)       no 11. PREGNANCY: "Is there any chance you are pregnant?" (e.g., yes, no; LMP)       n/a  Answer Assessment - Initial Assessment Questions 1. DRUG NAME: "What medicine do you need to have refilled?"      Tramodol 2. REFILLS REMAINING: "How many refills are remaining?" (Note: The label on the medicine or pill bottle will show how many refills are remaining. If there are no refills remaining, then a renewal may be needed.)     none 3. EXPIRATION DATE: "What is the expiration date?" (Note: The label states when the prescription will expire, and thus can no longer be refilled.)     No RF on original Rx 4. PRESCRIBING HCP: "Who prescribed it?" Reason: If prescribed by specialist, call should be referred to that group.     Requesting PCP fill this for her 5. SYMPTOMS: "Do you have any symptoms?"     Back spasms 6. PREGNANCY: "Is there any chance that you are pregnant?" "When was your last menstrual period?"     n/a  Protocols used: MEDICATION REFILL AND RENEWAL CALL-A-AH, BACK PAIN-A-AH

## 2020-07-29 NOTE — Telephone Encounter (Signed)
Pt requesting PCP send "As soon as possible" a muscle relaxer- did not give name of medication- to   [facility], [place] [address]  Phone:  [phone] Fax:  [phone]  DEA #:  [id number]   Please advise and thank you

## 2020-07-29 NOTE — Telephone Encounter (Signed)
Pt called to report that she is having serious muscle spasms, she needs a Rx as soon as possible. She says Dr. (doesn't know the name) suggested   Best contact: [phone]  Transferred to triage   [place] [address]  Phone: [phone] Fax: [phone]

## 2020-07-29 NOTE — Telephone Encounter (Signed)
Pt stated the name of the medication is cortisone

## 2020-07-29 NOTE — Telephone Encounter (Signed)
Will forward to pcp

## 2020-07-31 ENCOUNTER — Encounter: Payer: Self-pay | Admitting: Gastroenterology

## 2020-07-31 DIAGNOSIS — R1319 Other dysphagia: Secondary | ICD-10-CM | POA: Insufficient documentation

## 2020-07-31 DIAGNOSIS — R109 Unspecified abdominal pain: Secondary | ICD-10-CM | POA: Insufficient documentation

## 2020-07-31 DIAGNOSIS — K222 Esophageal obstruction: Secondary | ICD-10-CM | POA: Insufficient documentation

## 2020-07-31 DIAGNOSIS — R634 Abnormal weight loss: Secondary | ICD-10-CM | POA: Insufficient documentation

## 2020-07-31 DIAGNOSIS — M549 Dorsalgia, unspecified: Secondary | ICD-10-CM | POA: Insufficient documentation
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
## 2020-08-01 NOTE — Telephone Encounter (Signed)
Please see previous message

## 2020-08-01 NOTE — Telephone Encounter (Signed)
Contacted pt and lvm

## 2020-08-02 ENCOUNTER — Other Ambulatory Visit: Payer: Self-pay | Admitting: Emergency Medicine

## 2020-08-02 ENCOUNTER — Telehealth: Payer: Self-pay | Admitting: Oncology

## 2020-08-02 ENCOUNTER — Other Ambulatory Visit (HOSPITAL_COMMUNITY): Payer: Self-pay

## 2020-08-02 ENCOUNTER — Other Ambulatory Visit: Payer: Self-pay | Admitting: Cardiology

## 2020-08-02 MED ORDER — FUROSEMIDE 20 MG PO TABS
ORAL_TABLET | ORAL | 1 refills | Status: AC
  Filled 2020-08-02 – 2020-09-16 (×2): qty 45, 90d supply, fill #0

## 2020-08-02 MED FILL — Losartan Potassium Tab 25 MG: ORAL | 30 days supply | Qty: 30 | Fill #0 | Status: CN

## 2020-08-02 NOTE — Telephone Encounter (Signed)
Scheduled appt per 4/25 staff message from Ned Card- called pt - no answer. Left message and mailed letter with appt date and time

## 2020-08-02 NOTE — Telephone Encounter (Signed)
This is Dr. Schumann's pt 

## 2020-08-04 ENCOUNTER — Other Ambulatory Visit (HOSPITAL_COMMUNITY)
Admission: RE | Admit: 2020-08-04 | Discharge: 2020-08-04 | Disposition: A | Discharge status: Home / Self Care | Payer: Medicaid Other | Source: Ambulatory Visit | Attending: Gastroenterology | Admitting: Gastroenterology

## 2020-08-04 ENCOUNTER — Ambulatory Visit (HOSPITAL_COMMUNITY)
Admission: RE | Admit: 2020-08-04 | Discharge: 2020-08-04 | Disposition: A | Discharge status: Home / Self Care | Payer: Medicaid Other | Source: Ambulatory Visit | Attending: Gastroenterology | Admitting: Gastroenterology

## 2020-08-04 ENCOUNTER — Other Ambulatory Visit: Payer: Self-pay

## 2020-08-04 ENCOUNTER — Other Ambulatory Visit (HOSPITAL_COMMUNITY): Payer: Self-pay

## 2020-08-04 ENCOUNTER — Telehealth: Payer: Self-pay | Admitting: Gastroenterology

## 2020-08-04 DIAGNOSIS — K222 Esophageal obstruction: Secondary | ICD-10-CM

## 2020-08-04 DIAGNOSIS — R1319 Other dysphagia: Secondary | ICD-10-CM | POA: Diagnosis present

## 2020-08-04 DIAGNOSIS — R1013 Epigastric pain: Secondary | ICD-10-CM | POA: Diagnosis present

## 2020-08-04 DIAGNOSIS — Z20822 Contact with and (suspected) exposure to covid-19: Secondary | ICD-10-CM | POA: Insufficient documentation

## 2020-08-04 DIAGNOSIS — Z01812 Encounter for preprocedural laboratory examination: Secondary | ICD-10-CM | POA: Insufficient documentation

## 2020-08-04 DIAGNOSIS — G8929 Other chronic pain: Secondary | ICD-10-CM | POA: Diagnosis present

## 2020-08-04 DIAGNOSIS — M546 Pain in thoracic spine: Secondary | ICD-10-CM | POA: Diagnosis present

## 2020-08-04 DIAGNOSIS — C154 Malignant neoplasm of middle third of esophagus: Secondary | ICD-10-CM

## 2020-08-04 LAB — SARS CORONAVIRUS 2 (TAT 6-24 HRS): SARS Coronavirus 2: NEGATIVE

## 2020-08-04 NOTE — Telephone Encounter (Signed)
Please send to R[facility] see the note below from recent office visit.   "It has been recommended to you by your physician that you have a(n) Endo with dilation at the hospital  completed. Per your request, we did not schedule the procedure(s) today. Please call office and ask for R[facility] at [phone] once you have someone that come with you for your procedure."

## 2020-08-04 NOTE — Telephone Encounter (Signed)
Pls call pt. She called stating that she was on her way to get her Covid test. She said that she was told to have Covid test and then call us to schedule her EGD for this coming Monday.

## 2020-08-05 NOTE — Telephone Encounter (Signed)
Returned pt call. Had to leave message.

## 2020-08-09 ENCOUNTER — Other Ambulatory Visit (HOSPITAL_COMMUNITY): Payer: Self-pay

## 2020-08-12 NOTE — Telephone Encounter (Signed)
Patient returned called. Stated the best contact number [phone]

## 2020-08-15 ENCOUNTER — Other Ambulatory Visit: Payer: Self-pay

## 2020-08-15 DIAGNOSIS — R1319 Other dysphagia: Secondary | ICD-10-CM

## 2020-08-15 DIAGNOSIS — K222 Esophageal obstruction: Secondary | ICD-10-CM

## 2020-08-15 DIAGNOSIS — C154 Malignant neoplasm of middle third of esophagus: Secondary | ICD-10-CM

## 2020-08-15 NOTE — Telephone Encounter (Signed)
Patient has been scheduled for 08/31/20 11:00am @ [facility]. Covid Test will be done on 08/26/20 at 12:15pm. Instructions have been mailed to pt and sent via mychart. Pt voiced understanding.

## 2020-08-15 NOTE — Telephone Encounter (Signed)
Pt called again. Pls call her.

## 2020-08-26 ENCOUNTER — Other Ambulatory Visit: Payer: Self-pay

## 2020-08-26 ENCOUNTER — Other Ambulatory Visit (HOSPITAL_COMMUNITY)
Admission: RE | Admit: 2020-08-26 | Discharge: 2020-08-26 | Disposition: A | Discharge status: Home / Self Care | Payer: Medicaid Other | Source: Ambulatory Visit | Attending: Gastroenterology | Admitting: Gastroenterology

## 2020-08-26 ENCOUNTER — Encounter (HOSPITAL_COMMUNITY): Payer: Self-pay | Admitting: Gastroenterology

## 2020-08-26 DIAGNOSIS — Z20822 Contact with and (suspected) exposure to covid-19: Secondary | ICD-10-CM | POA: Diagnosis not present

## 2020-08-26 DIAGNOSIS — Z01812 Encounter for preprocedural laboratory examination: Secondary | ICD-10-CM | POA: Diagnosis not present

## 2020-08-26 LAB — SARS CORONAVIRUS 2 (TAT 6-24 HRS): SARS Coronavirus 2: NEGATIVE

## 2020-08-29 ENCOUNTER — Encounter: Payer: Self-pay | Admitting: Oncology

## 2020-08-30 ENCOUNTER — Telehealth: Payer: Self-pay | Admitting: *Deleted

## 2020-08-30 NOTE — Telephone Encounter (Signed)
Called patient and confirmed she is having EGD tomorrow morning. Will cancel her appointment 5/25 here and reschedule for Friday. High priority scheduling message sent. She is requesting pain medication. Informed she needs to be seen before MD will consider this. Suggested she call her PCP that ordered her Tramadol in April. Reports having back pain.

## 2020-08-31 ENCOUNTER — Encounter (HOSPITAL_COMMUNITY): Payer: Self-pay | Admitting: Gastroenterology

## 2020-08-31 ENCOUNTER — Other Ambulatory Visit (HOSPITAL_COMMUNITY): Payer: Self-pay

## 2020-08-31 ENCOUNTER — Ambulatory Visit (HOSPITAL_COMMUNITY): Payer: Medicaid Other

## 2020-08-31 ENCOUNTER — Other Ambulatory Visit: Payer: Self-pay

## 2020-08-31 ENCOUNTER — Ambulatory Visit (HOSPITAL_COMMUNITY): Payer: Medicaid Other | Admitting: Anesthesiology

## 2020-08-31 ENCOUNTER — Encounter: Payer: Self-pay | Admitting: Oncology

## 2020-08-31 ENCOUNTER — Inpatient Hospital Stay: Payer: Medicaid Other | Admitting: Oncology

## 2020-08-31 ENCOUNTER — Ambulatory Visit (HOSPITAL_COMMUNITY)
Admission: RE | Admit: 2020-08-31 | Discharge: 2020-08-31 | Disposition: A | Discharge status: Home / Self Care | Payer: Medicaid Other | Attending: Gastroenterology | Admitting: Gastroenterology

## 2020-08-31 ENCOUNTER — Encounter (HOSPITAL_COMMUNITY)
Admission: RE | Disposition: A | Discharge status: Home / Self Care | Payer: Self-pay | Source: Home / Self Care | Attending: Gastroenterology

## 2020-08-31 DIAGNOSIS — K449 Diaphragmatic hernia without obstruction or gangrene: Secondary | ICD-10-CM | POA: Insufficient documentation

## 2020-08-31 DIAGNOSIS — C154 Malignant neoplasm of middle third of esophagus: Secondary | ICD-10-CM

## 2020-08-31 DIAGNOSIS — I5020 Unspecified systolic (congestive) heart failure: Secondary | ICD-10-CM | POA: Diagnosis not present

## 2020-08-31 DIAGNOSIS — K222 Esophageal obstruction: Secondary | ICD-10-CM | POA: Insufficient documentation

## 2020-08-31 DIAGNOSIS — Z9889 Other specified postprocedural states: Secondary | ICD-10-CM | POA: Diagnosis not present

## 2020-08-31 DIAGNOSIS — B1920 Unspecified viral hepatitis C without hepatic coma: Secondary | ICD-10-CM | POA: Insufficient documentation

## 2020-08-31 DIAGNOSIS — K2289 Other specified disease of esophagus: Secondary | ICD-10-CM | POA: Insufficient documentation

## 2020-08-31 DIAGNOSIS — Z923 Personal history of irradiation: Secondary | ICD-10-CM | POA: Diagnosis not present

## 2020-08-31 DIAGNOSIS — C159 Malignant neoplasm of esophagus, unspecified: Secondary | ICD-10-CM | POA: Insufficient documentation

## 2020-08-31 DIAGNOSIS — R1310 Dysphagia, unspecified: Secondary | ICD-10-CM

## 2020-08-31 DIAGNOSIS — Z560 Unemployment, unspecified: Secondary | ICD-10-CM | POA: Diagnosis not present

## 2020-08-31 DIAGNOSIS — R1319 Other dysphagia: Secondary | ICD-10-CM

## 2020-08-31 DIAGNOSIS — K209 Esophagitis, unspecified without bleeding: Secondary | ICD-10-CM | POA: Diagnosis not present

## 2020-08-31 DIAGNOSIS — K2210 Ulcer of esophagus without bleeding: Secondary | ICD-10-CM | POA: Diagnosis not present

## 2020-08-31 DIAGNOSIS — Z87891 Personal history of nicotine dependence: Secondary | ICD-10-CM | POA: Insufficient documentation

## 2020-08-31 HISTORY — PX: SAVORY DILATION: SHX5439

## 2020-08-31 HISTORY — PX: ESOPHAGOGASTRODUODENOSCOPY (EGD) WITH PROPOFOL: SHX5813

## 2020-08-31 HISTORY — PX: BIOPSY: SHX5522

## 2020-08-31 SURGERY — ESOPHAGOGASTRODUODENOSCOPY (EGD) WITH PROPOFOL
Anesthesia: Monitor Anesthesia Care

## 2020-08-31 MED ORDER — PROPOFOL 500 MG/50ML IV EMUL
INTRAVENOUS | Status: AC
  Filled 2020-08-31: qty 100

## 2020-08-31 MED ORDER — OMEPRAZOLE 20 MG PO CPDR
20.0000 mg | DELAYED_RELEASE_CAPSULE | Freq: Two times a day (BID) | ORAL | 6 refills | Status: DC
  Filled 2020-08-31 – 2020-09-16 (×2): qty 60, 30d supply, fill #0

## 2020-08-31 MED ORDER — SODIUM CHLORIDE 0.9 % IV SOLN: INTRAVENOUS | Status: DC

## 2020-08-31 MED ORDER — PROPOFOL 500 MG/50ML IV EMUL
INTRAVENOUS | Status: DC | PRN
  Administered 2020-08-31: 130 ug/kg/min via INTRAVENOUS

## 2020-08-31 MED ORDER — PROPOFOL 10 MG/ML IV BOLUS
INTRAVENOUS | Status: AC
  Filled 2020-08-31: qty 20

## 2020-08-31 MED ORDER — LIDOCAINE 5 % EX PTCH
1.0000 | MEDICATED_PATCH | Freq: Two times a day (BID) | CUTANEOUS | 0 refills | Status: AC
  Filled 2020-08-31: qty 10, 10d supply, fill #0

## 2020-08-31 MED ORDER — PROPOFOL 10 MG/ML IV BOLUS
INTRAVENOUS | Status: DC | PRN
  Administered 2020-08-31: 40 mg via INTRAVENOUS

## 2020-08-31 MED ORDER — PROPOFOL 500 MG/50ML IV EMUL
INTRAVENOUS | Status: AC
  Filled 2020-08-31: qty 50

## 2020-08-31 MED ORDER — LIDOCAINE HCL (CARDIAC) PF 100 MG/5ML IV SOSY
PREFILLED_SYRINGE | INTRAVENOUS | Status: DC | PRN
  Administered 2020-08-31: 100 mg via INTRAVENOUS

## 2020-08-31 MED ORDER — LACTATED RINGERS IV SOLN: INTRAVENOUS | Status: DC

## 2020-08-31 MED ORDER — OMEPRAZOLE 20 MG PO CPDR
20.0000 mg | DELAYED_RELEASE_CAPSULE | Freq: Two times a day (BID) | ORAL | 6 refills | Status: DC
  Filled 2020-08-31: qty 60, 30d supply, fill #0

## 2020-08-31 SURGICAL SUPPLY — 14 items

## 2020-08-31 NOTE — Op Note (Signed)
[facility] Patient Name: Alexandria Buck Procedure Date: 08/31/2020 MRN: [id number] Attending MD: Justice Britain , MD Date of Birth: 12-03-1960 CSN: [id number] Age: 60 Admit Type: Outpatient Procedure:                Upper GI endoscopy Indications:              Therapeutic procedure, Dysphagia, Esophagitis,                            Follow-up of esophagitis, Personal history of                            malignant esophageal neoplasm, Personal history of                            upper GI endoscopy Providers:                Justice Britain, MD, Baird Cancer, RN, Ladona Ridgel, Technician, Stephanie British [place] CRNA Referring MD:             Jodelle Gross, Dix Izola Price Medicines:                Monitored Anesthesia Care Complications:            No immediate complications. Estimated Blood Loss:     Estimated blood loss was minimal. Procedure:                Pre-Anesthesia Assessment:                           - Prior to the procedure, a History and Physical                            was performed, and patient medications and                            allergies were reviewed. The patient's tolerance of                            previous anesthesia was also reviewed. The risks                            and benefits of the procedure and the sedation                            options and risks were discussed with the patient.                            All questions were answered, and informed consent                            was obtained. Prior Anticoagulants: The patient has  taken no previous anticoagulant or antiplatelet                            agents. ASA Grade Assessment: III - A patient with                            severe systemic disease. After reviewing the risks                            and benefits, the patient was deemed in                            satisfactory  condition to undergo the procedure.                           After obtaining informed consent, the endoscope was                            passed under direct vision. Throughout the                            procedure, the patient's blood pressure, pulse, and                            oxygen saturations were monitored continuously. The                            GIF-H190 ([id number]) Olympus gastroscope was                            introduced through the mouth, and advanced to the                            second part of duodenum. The upper GI endoscopy was                            accomplished without difficulty. The patient                            tolerated the procedure. Scope In: Scope Out: Findings:      One benign-appearing, intrinsic moderate (circumferential scarring or       stenosis; an endoscope may pass) stenosis was found 16 to 17 cm from the       incisors. This stenosis measured 9 mm (inner diameter) x 1 cm (in       length). The stenosis was traversed with the adult endoscope leading to       a mucosal wrent.      One benign-appearing, intrinsic moderate (circumferential scarring or       stenosis; an endoscope may pass) stenosis was found 25 to 26 cm from the       incisors. This stenosis measured 9 mm (inner diameter) x 1 cm (in       length). The stenosis was traversed with the adult endosocpe leading to       a mucosal wrent.  One cratered esophageal ulcer with no stigmata of recent bleeding was       found transitioning from the stenosis noted above into a cratered       pouch-like ulceration following from 25 to 28 cm from the incisors. Some       foodstuffs were present within this ulceration. Biopsies were taken with       a cold forceps for histology - this is location of previous SCC s/p XRT.      White nummular lesions were noted in the distal esophagus. Biopsies were       taken with a cold forceps for histology.      The Z-line was irregular and  was found 36 cm from the incisors.      After the rest of the EGD was complete, a guidewire was placed under       fluoroscopic guidance and the scope was withdrawn. Dilation was       performed in the entire esophagus with a Savary dilator with no       resistance at 10 mm, mild resistance at 11 mm and 12 mm and moderate       resistance at 12.8 mm. The dilation site was examined after each       dilation following endoscope reinsertion and showed moderate mucosal       disruption, moderate improvement in luminal narrowing and no perforation       in both areas of previous stricturing.      A 4 cm hiatal hernia was present.      No gross lesions were noted in the entire examined stomach.      No gross lesions were noted in the duodenal bulb, in the first portion       of the duodenum and in the second portion of the duodenum. Impression:               - Benign-appearing esophageal stenosis just below                            UES.                           - Benign-appearing esophageal stenosis at region of                            previous SCC (s/p XRT). This leads to an esophageal                            pouch/ulceration - likely a result of previous                            treatment with some foodstuffs noted within. This                            ulceration was biopsied.                           - White nummular lesions in esophageal mucosa in                            distal esophagus. Biopsied.                           -  Z-line irregular, 36 cm from the incisors.                           - Dilation performed in the entire esophagus with                            appropriate mucosal wrenting noted at both                            strictured regions.                           - 4 cm hiatal hernia.                           - No gross lesions in the stomach.                           - No gross lesions in the duodenal bulb, in the                            first  portion of the duodenum and in the second                            portion of the duodenum. Moderate Sedation:      Not Applicable - Patient had care per Anesthesia. Recommendation:           - The patient will be observed post-procedure,                            until all discharge criteria are met.                           - Discharge patient to home.                           - Patient has a contact number available for                            emergencies. The signs and symptoms of potential                            delayed complications were discussed with the                            patient. Return to normal activities tomorrow.                            Written discharge instructions were provided to the                            patient.                           - Dilation diet as per protocol.                           -  Observe patient's clinical course.                           - Increase Omeprazole to 20 mg twice daily (patient                            has OTC but if wants a prescription we can send).                           - Await pathology results.                           - Plan for Barium Swallow to define anatomy of the                            mid-esophagus a bit more.                           - Repeat upper endoscopy in 1 month for                            retreatment/further dilation.                           - The findings and recommendations were discussed                            with the patient. Procedure Code(s):        --- Professional ---                           [phone], Esophagogastroduodenoscopy, flexible,                            transoral; with insertion of guide wire followed by                            passage of dilator(s) through esophagus over guide                            wire Diagnosis Code(s):        --- Professional ---                           K22.2, Esophageal obstruction                           K22.10,  Ulcer of esophagus without bleeding                           K22.8, Other specified diseases of esophagus                           K44.9, Diaphragmatic hernia without obstruction or  gangrene                           R13.10, Dysphagia, unspecified                           K20.90, Esophagitis, unspecified without bleeding                           Z85.01, Personal history of malignant neoplasm of                            esophagus                           Z98.890, Other specified postprocedural states CPT copyright 2019 American Medical Association. All rights reserved. The codes documented in this report are preliminary and upon coder review may  be revised to meet current compliance requirements. Justice Britain, MD 08/31/2020 12:39:28 PM Number of Addenda: 0
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
## 2020-08-31 NOTE — Anesthesia Postprocedure Evaluation (Signed)
Anesthesia Post Note  Patient: Alexandria Buck  Procedure(s) Performed: ESOPHAGOGASTRODUODENOSCOPY (EGD) WITH PROPOFOL (N/A ) SAVORY DILATION- Fluoroscopy (N/A ) BIOPSY     Patient location during evaluation: PACU Anesthesia Type: MAC Level of consciousness: awake and alert Pain management: pain level controlled Vital Signs Assessment: post-procedure vital signs reviewed and stable Respiratory status: spontaneous breathing and respiratory function stable Cardiovascular status: stable Postop Assessment: no apparent nausea or vomiting Anesthetic complications: no   No complications documented.  Last Vitals:  Vitals:   08/31/20 1300 08/31/20 1310  BP: (!) 180/80 (!) 165/99  Pulse: 66 63  Resp: 20 13  Temp:    SpO2: 100% 100%    Last Pain:  Vitals:   08/31/20 1310  TempSrc:   PainSc: 10-Worst pain ever                 Jansel Vonstein DANIEL

## 2020-08-31 NOTE — Discharge Instructions (Signed)
YOU HAD AN ENDOSCOPIC PROCEDURE TODAY: Refer to the procedure report and other information in the discharge instructions given to you for any specific questions about what was found during the examination. If this information does not answer your questions, please call [place] office at [phone] to clarify.   YOU SHOULD EXPECT: Some feelings of bloating in the abdomen. Passage of more gas than usual. Walking can help get rid of the air that was put into your GI tract during the procedure and reduce the bloating. If you had a lower endoscopy (such as a colonoscopy or flexible sigmoidoscopy) you may notice spotting of blood in your stool or on the toilet paper. Some abdominal soreness may be present for a day or two, also.  DIET: Your first meal following the procedure should be a light meal and then it is ok to progress to your normal diet. A half-sandwich or bowl of soup is an example of a good first meal. Heavy or fried foods are harder to digest and may make you feel nauseous or bloated. Drink plenty of fluids but you should avoid alcoholic beverages for 24 hours. If you had a esophageal dilation, please see attached instructions for diet.    ACTIVITY: Your care partner should take you home directly after the procedure. You should plan to take it easy, moving slowly for the rest of the day. You can resume normal activity the day after the procedure however YOU SHOULD NOT DRIVE, use power tools, machinery or perform tasks that involve climbing or major physical exertion for 24 hours (because of the sedation medicines used during the test).   SYMPTOMS TO REPORT IMMEDIATELY: A gastroenterologist can be reached at any hour. Please call [phone]  for any of the following symptoms:   Following upper endoscopy (EGD, EUS, ERCP, esophageal dilation) Vomiting of blood or coffee ground material  New, significant abdominal pain  New, significant chest pain or pain under the shoulder blades  Painful or  persistently difficult swallowing  New shortness of breath  Black, tarry-looking or red, bloody stools  FOLLOW UP:  If any biopsies were taken you will be contacted by phone or by letter within the next 1-3 weeks. Call [phone]  if you have not heard about the biopsies in 3 weeks.  Please also call with any specific questions about appointments or follow up tests.  

## 2020-08-31 NOTE — Anesthesia Preprocedure Evaluation (Addendum)
Anesthesia Evaluation  Patient identified by MRN, date of birth, ID band Patient awake    Reviewed: Allergy & Precautions, H&P , NPO status , Patient's Chart, lab work & pertinent test results  History of Anesthesia Complications Negative for: history of anesthetic complications  Airway Mallampati: I  TM Distance: >3 FB Neck ROM: Full    Dental  (+) Edentulous Upper, Edentulous Lower, Dental Advisory Given   Pulmonary neg pulmonary ROS, Patient abstained from smoking., former smoker,    Pulmonary exam normal        Cardiovascular Exercise Tolerance: Good hypertension, Pt. on medications and Pt. on home beta blockers +CHF  Normal cardiovascular exam     Neuro/Psych PSYCHIATRIC DISORDERS Depression Bipolar Disorder negative neurological ROS     GI/Hepatic negative GI ROS, (+) Hepatitis -, C  Endo/Other  negative endocrine ROS  Renal/GU negative Renal ROS  negative genitourinary   Musculoskeletal   Abdominal   Peds  Hematology negative hematology ROS (+)   Anesthesia Other Findings   Reproductive/Obstetrics negative OB ROS                            Anesthesia Physical  Anesthesia Plan  ASA: IV  Anesthesia Plan: MAC   Post-op Pain Management:    Induction: Intravenous  PONV Risk Score and Plan: 2 and Midazolam and Ondansetron  Airway Management Planned: Nasal Cannula  Additional Equipment:   Intra-op Plan:   Post-operative Plan:   Informed Consent: I have reviewed the patients History and Physical, chart, labs and discussed the procedure including the risks, benefits and alternatives for the proposed anesthesia with the patient or authorized representative who has indicated his/her understanding and acceptance.     Dental advisory given  Plan Discussed with: CRNA and Anesthesiologist  Anesthesia Plan Comments: ( She was referred to EP for evaluation of ICD placement.   Seen by Dr. Caryl Comes 01/11/2020.  Per note she remains limited in activity, shortness of breath at less than 100 yards.  No orthopnea or nocturnal dyspnea.  No edema.  She does also report frequent palpitations.  ICD was discussed, and patient did elect to proceed.  Event monitor was ordered to characterize palpitations, this has not been completed yet.  History hepatitis C, has been seen by infectious disease and Harvoni was planned but patient did not pick up medication.  BMP and CBC from 01/08/2020 reviewed, sodium mildly low at 132, otherwise unremarkable.  We will need day of surgery evaluation.  EKG 01/11/2020: Sinus bradycardia.  Rate 58.  TTE 12/24/2019: 1. Left ventricular ejection fraction, by estimation, is 20 to 25%. The  left ventricle has severely decreased function. The left ventricle  demonstrates global hypokinesis. Left ventricular diastolic parameters are  consistent with Grade I diastolic  dysfunction (impaired relaxation).  2. Right ventricular systolic function is mildly reduced. The right  ventricular size is normal. Tricuspid regurgitation signal is inadequate  for assessing PA pressure.  3. Left atrial size was mildly dilated.  4. The mitral valve is normal in structure. Moderate central mitral valve  regurgitation, likely functional. No evidence of mitral stenosis.  5. The aortic valve is tricuspid. Aortic valve regurgitation is not  visualized. No aortic stenosis is present.  6. The inferior vena cava is normal in size with greater than 50%  respiratory variability, suggesting right atrial pressure of 3 mmHg.  7. Consider referral to CHF clinic.   Left/right heart cath 06/25/2019: 1. No angiographic evidence  of CAD 2. Elevated right heart pressures (RA 14, RV 54/8/14, PA 57/25/37, PCWP 26).  )       Anesthesia Quick Evaluation

## 2020-08-31 NOTE — Transfer of Care (Signed)
Immediate Anesthesia Transfer of Care Note  Patient: Alexandria Buck  Procedure(s) Performed: ESOPHAGOGASTRODUODENOSCOPY (EGD) WITH PROPOFOL (N/A ) SAVORY DILATION- Fluoroscopy (N/A ) BIOPSY  Patient Location: PACU and Endoscopy Unit  Anesthesia Type:MAC  Level of Consciousness: awake and alert   Airway & Oxygen Therapy: Patient Spontanous Breathing and Patient connected to face mask oxygen  Post-op Assessment: Report given to RN and Post -op Vital signs reviewed and stable  Post vital signs: Reviewed and stable  Last Vitals:  Vitals Value Taken Time  BP    Temp    Pulse    Resp    SpO2      Last Pain:  Vitals:   08/31/20 1102  TempSrc: Oral  PainSc: 8          Complications: No complications documented.

## 2020-09-01 ENCOUNTER — Other Ambulatory Visit: Payer: Self-pay

## 2020-09-01 ENCOUNTER — Encounter: Payer: Self-pay | Admitting: Oncology

## 2020-09-01 ENCOUNTER — Other Ambulatory Visit (HOSPITAL_COMMUNITY): Payer: Self-pay

## 2020-09-01 ENCOUNTER — Encounter: Payer: Self-pay | Admitting: Gastroenterology

## 2020-09-01 DIAGNOSIS — K222 Esophageal obstruction: Secondary | ICD-10-CM

## 2020-09-01 DIAGNOSIS — R1319 Other dysphagia: Secondary | ICD-10-CM

## 2020-09-01 LAB — SURGICAL PATHOLOGY

## 2020-09-01 MED ORDER — FLUCONAZOLE 200 MG PO TABS
ORAL_TABLET | ORAL | 0 refills | Status: AC
  Filled 2020-09-01: qty 15, 14d supply, fill #0

## 2020-09-02 ENCOUNTER — Encounter: Payer: Self-pay | Admitting: Nurse Practitioner

## 2020-09-02 ENCOUNTER — Other Ambulatory Visit: Payer: Self-pay

## 2020-09-02 ENCOUNTER — Inpatient Hospital Stay: Payer: Medicaid Other | Attending: Nurse Practitioner | Admitting: Nurse Practitioner

## 2020-09-02 ENCOUNTER — Other Ambulatory Visit (HOSPITAL_COMMUNITY): Payer: Self-pay

## 2020-09-02 ENCOUNTER — Telehealth: Payer: Self-pay | Admitting: Internal Medicine

## 2020-09-02 ENCOUNTER — Encounter: Payer: Self-pay | Admitting: Oncology

## 2020-09-02 DIAGNOSIS — I10 Essential (primary) hypertension: Secondary | ICD-10-CM | POA: Insufficient documentation

## 2020-09-02 DIAGNOSIS — F319 Bipolar disorder, unspecified: Secondary | ICD-10-CM | POA: Diagnosis not present

## 2020-09-02 DIAGNOSIS — C154 Malignant neoplasm of middle third of esophagus: Secondary | ICD-10-CM | POA: Diagnosis not present

## 2020-09-02 DIAGNOSIS — I429 Cardiomyopathy, unspecified: Secondary | ICD-10-CM | POA: Diagnosis not present

## 2020-09-02 MED ORDER — HYDROCODONE-ACETAMINOPHEN 7.5-325 MG/15ML PO SOLN
10.0000 mL | Freq: Two times a day (BID) | ORAL | 0 refills | Status: AC | PRN
  Filled 2020-09-02: qty 200, 10d supply, fill #0

## 2020-09-02 NOTE — Progress Notes (Addendum)
[place] OFFICE PROGRESS NOTE   Diagnosis: Esophagus cancer  INTERVAL HISTORY:   Alexandria Buck was last seen at the [facility] 06/07/2020.  She reports continued pain mid chest and back.  She would like a new pain medication.  She notes improvement in swallowing.  No significant nausea/vomiting.  Bowels are moving.  Objective:  Vital signs in last 24 hours:  Blood pressure (!) 158/99, pulse 63, temperature 98 F (36.7 C), temperature source Oral, resp. rate 20, height '5\' 1"'  (1.549 m), weight 85 lb 12.8 oz (38.9 kg), SpO2 100 %.    HEENT: No thrush or ulcers. Resp: Distant breath sounds.  No wheezes or rales.  No respiratory distress. Cardio: Regular rate and rhythm. GI: Abdomen soft and nontender.  No hepatomegaly. Vascular: No leg edema. Musculoskeletal: Marked tenderness with light palpation over the mid back region bilaterally. Skin: Hyperpigmentation mid back.   Lab Results:  Lab Results  Component Value Date   WBC 5.3 07/27/2020   HGB 12.3 07/27/2020   HCT 38.8 07/27/2020   MCV 92.4 07/27/2020   PLT 243 07/27/2020   NEUTROABS 3.2 07/27/2020    Imaging:  No results found.  Medications: I have reviewed the patient's current medications.  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 Endoscopy 01/14/2020-partially obstructing mid esophagus mass, squamous cell carcinoma, PD-L1 CPS 10% ? PET scan 01/25/2020-hypermetabolic mid esophagus mass, mildly hypermetabolic right axillary lymph nodes ? Radiation 02/09/2020-03/22/2020 ? Cycle 1 Taxol/carboplatin 02/10/2020 ? Cycle 2 Taxol/carboplatin 02/17/2020 ? Cycle 3 Taxol/carboplatin 02/24/2020 ? Chemotherapy held 03/02/2020 due to neutropenia ? Cycle 4 Taxol/carboplatin 03/09/2020 (Taxol dose reduced due to previous neutropenia) ? Cycle 5 Taxol/carboplatin 03/16/2020 ? CT chest 06/07/2020-indistinct tissue planes in the mediastinum along the esophagus following radiation therapy.  No  mass lesion identified.  No new mediastinal lymphadenopathy.  Stable right hilar lymph node. ? CT chest/abdomen/pelvis 07/28/2020- focal outpouching of the lateral thoracic esophagus near the level of the carina corresponding to the patient's previously seen site of malignancy.  Some mild hazy paraesophageal margins and stranding in the posterior mediastinum about the esophagus itself which is nonspecific.  No large or current focal mass lesion seen.  No significant residual paraesophageal adenopathy seen. ? Upper endoscopy 08/31/2020- benign-appearing esophageal stenosis just below the UES.  Benign-appearing esophageal stenosis at the region of previous SCC.  This leads to an esophageal pouch/ulceration, ulcerations biopsy.  White nummular lesions in the esophageal mucosa and distal esophagus, biopsied.  Dilatation performed in the entire esophagus.  Pathology on distal esophageal biopsy showed inflamed squamous mucosa with fungus consistent with candidal esophagitis.  No dysplasia or malignancy.  Esophagus biopsy showed a small fragment of squamous cell carcinoma associated with abundant necrosis and exudate. 2. Benign upper esophageal stricture, status post dilation 01/14/2020, 02/08/2020 3. H. pylori gastritis, treated with medical therapy October 2021 4. Cardiomyopathy 5. Bipolar disorder 6. Tobacco and alcohol use 7. Hypertension 8. COVID-19 vaccination,booster 04/29/2020 9. Urinary tract infection 02/16/2020 10. Odynophagia secondary to radiation-resolved   Disposition: Alexandria Buck appears unchanged.  We discussed the biopsy result from the recent upper endoscopy.  She understands the biopsy showed squamous cell carcinoma.  She is not likely a surgical candidate due to severe cardiomyopathy, other comorbid conditions.  We discussed the possibility of immunotherapy.  Her case will be presented at the GI tumor conference.   Etiology of the back pain is unclear.  Prescription sent to her pharmacy for  Hycet.  She understands she should not drive  while taking pain medication.  She will return for follow-up 09/26/2020.  Patient seen with Dr. Benay Spice.    Ned Card ANP/GNP-BC   09/02/2020  2:31 PM This was a shared visit with Ned Card.  Alexandria Buck was interviewed and examined.  She has been diagnosed with local persistence/progression of esophagus cancer.  We discussed treatment options with Alexandria Buck.  I doubt she would be a surgical candidate.  I will present her case at the GI tumor conference.  She will return for an office visit for further discussion of treatment options in approximately 3 weeks.  There is no clear radiologic or physical exam evidence of measurable disease which will make it difficult to judge a response to therapy.  I was present for greater than 50% of today's visit.  I performed medical decision making.  Julieanne Manson, MD

## 2020-09-03 ENCOUNTER — Other Ambulatory Visit: Payer: Self-pay | Admitting: Nurse Practitioner

## 2020-09-03 ENCOUNTER — Other Ambulatory Visit (HOSPITAL_COMMUNITY): Payer: Self-pay

## 2020-09-03 ENCOUNTER — Other Ambulatory Visit: Payer: Self-pay | Admitting: Cardiology

## 2020-09-03 MED FILL — Losartan Potassium Tab 25 MG: ORAL | 30 days supply | Qty: 30 | Fill #0 | Status: CN

## 2020-09-05 ENCOUNTER — Encounter: Payer: Self-pay | Admitting: Oncology
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
## 2020-09-06 ENCOUNTER — Other Ambulatory Visit: Payer: Self-pay

## 2020-09-06 ENCOUNTER — Other Ambulatory Visit (HOSPITAL_COMMUNITY): Payer: Self-pay

## 2020-09-06 ENCOUNTER — Telehealth: Payer: Self-pay | Admitting: Internal Medicine

## 2020-09-06 ENCOUNTER — Other Ambulatory Visit: Payer: Self-pay | Admitting: Nurse Practitioner

## 2020-09-06 ENCOUNTER — Telehealth: Payer: Self-pay | Admitting: Gastroenterology

## 2020-09-06 DIAGNOSIS — K222 Esophageal obstruction: Secondary | ICD-10-CM

## 2020-09-06 DIAGNOSIS — R1319 Other dysphagia: Secondary | ICD-10-CM

## 2020-09-06 MED ORDER — POTASSIUM CHLORIDE CRYS ER 20 MEQ PO TBCR
EXTENDED_RELEASE_TABLET | Freq: Every day | ORAL | 1 refills | Status: AC
  Filled 2020-09-06 – 2020-09-16 (×2): qty 30, 30d supply, fill #0

## 2020-09-06 MED ORDER — CARVEDILOL 12.5 MG PO TABS
12.5000 mg | ORAL_TABLET | Freq: Two times a day (BID) | ORAL | 3 refills | Status: AC
  Filled 2020-09-06 – 2020-09-16 (×2): qty 180, 90d supply, fill #0

## 2020-09-06 NOTE — Telephone Encounter (Signed)
Will forward to provider  

## 2020-09-06 NOTE — Telephone Encounter (Signed)
I spoke with the pt's daughter and she was advised that the pt is taking meds for candida esophagitis.  She is also aware I will be reaching out with next EGD date.

## 2020-09-06 NOTE — Telephone Encounter (Signed)
Copied from [place] [id number]. Topic: Quick Communication - Rx Refill/Question >> Sep 06, 2020 10:31 AM Erick Blinks wrote: Best contact: [phone]  Pt called requesting a call back from the office, she says she needs a Rx for her back pain. She does not know the name of the medication, she thinks its aldactone but she is not sure. She wants to speak to a nurse to confirm, says she needs needs Rx asap.   Medication:   Has the patient contacted their pharmacy? No. (Agent: If no, request that the patient contact the pharmacy for the refill.) (Agent: If yes, when and what did the pharmacy advise?)  Preferred Pharmacy (with phone number or street name): [facility] [place] [id number] Phone: [phone] Fax: [phone]    Agent: Please be advised that RX refills may take up to 3 business days. We ask that you follow-up with your pharmacy.

## 2020-09-06 NOTE — Telephone Encounter (Signed)
Please advise 

## 2020-09-06 NOTE — Telephone Encounter (Signed)
Pt's daughter stated that Dr. Rush Landmark called her mother this morning. Pt didn't understand the conversation so pt's daughter Emelia Salisbury would like a better understanding on what the call was pertaining to. Please give her a call. Thank you

## 2020-09-07 ENCOUNTER — Other Ambulatory Visit: Payer: Self-pay

## 2020-09-07 ENCOUNTER — Ambulatory Visit (INDEPENDENT_AMBULATORY_CARE_PROVIDER_SITE_OTHER): Payer: Medicaid Other | Admitting: Cardiology

## 2020-09-07 VITALS — BP 128/74 | HR 73 | Ht 61.0 in | Wt 87.0 lb

## 2020-09-07 DIAGNOSIS — I5042 Chronic combined systolic (congestive) and diastolic (congestive) heart failure: Secondary | ICD-10-CM | POA: Diagnosis not present

## 2020-09-07 DIAGNOSIS — R002 Palpitations: Secondary | ICD-10-CM | POA: Diagnosis not present

## 2020-09-07 DIAGNOSIS — I10 Essential (primary) hypertension: Secondary | ICD-10-CM | POA: Diagnosis not present

## 2020-09-07 DIAGNOSIS — I340 Nonrheumatic mitral (valve) insufficiency: Secondary | ICD-10-CM

## 2020-09-07 DIAGNOSIS — Z720 Tobacco use: Secondary | ICD-10-CM
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
## 2020-09-07 NOTE — Patient Instructions (Addendum)
Medication Instructions:  When you get home check your medication and make sure you are not taking Losartan AND Entresto.   --if you are, STOP Losartan and ONLY take Entresto  *If you need a refill on your cardiac medications before your next appointment, please call your pharmacy*   Lab Work: BMET, [place] today  If you have labs (blood work) drawn today and your tests are completely normal, you will receive your results only by: Marland Kitchen MyChart Message (if you have MyChart) OR . A paper copy in the mail If you have any lab test that is abnormal or we need to change your treatment, we will call you to review the results.  Follow-Up: At [facility], you and your health needs are our priority.  As part of our continuing mission to provide you with exceptional heart care, we have created designated Provider Care Teams.  These Care Teams include your primary Cardiologist (physician) and Advanced Practice Providers (APPs -  Physician Assistants and Nurse Practitioners) who all work together to provide you with the care you need, when you need it.  We recommend signing up for the patient portal called "MyChart".  Sign up information is provided on this After Visit Summary.  MyChart is used to connect with patients for Virtual Visits (Telemedicine).  Patients are able to view lab/test results, encounter notes, upcoming appointments, etc.  Non-urgent messages can be sent to your provider as well.   To learn more about what you can do with MyChart, go to [email or web address].    Your next appointment:    Next available with pharmacist for medication reconciliation (BRING ALL MEDICATION WITH YOU)  3 month(s) with Dr. Gardiner Rhyme   Other Instructions It is time to renew patient assistance-please complete forms and return to office.

## 2020-09-08 ENCOUNTER — Encounter: Payer: Self-pay | Admitting: Oncology

## 2020-09-08 ENCOUNTER — Encounter: Payer: Self-pay | Admitting: *Deleted

## 2020-09-08 ENCOUNTER — Ambulatory Visit: Payer: Self-pay | Admitting: Internal Medicine

## 2020-09-08 LAB — BASIC METABOLIC PANEL
BUN/Creatinine Ratio: 14 (ref 12–28)
BUN: 11 mg/dL (ref 8–27)
CO2: 24 mmol/L (ref 20–29)
Calcium: 9.6 mg/dL (ref 8.7–10.3)
Chloride: 102 mmol/L (ref 96–106)
Creatinine, Ser: 0.8 mg/dL (ref 0.57–1.00)
Glucose: 97 mg/dL (ref 65–99)
Potassium: 3.8 mmol/L (ref 3.5–5.2)
Sodium: 141 mmol/L (ref 134–144)
eGFR: 84 mL/min/{1.73_m2} (ref >59)

## 2020-09-08 LAB — MAGNESIUM: Magnesium: 1.8 mg/dL (ref 1.6–2.3)

## 2020-09-08 NOTE — Telephone Encounter (Signed)
Will forward to provider  

## 2020-09-08 NOTE — Telephone Encounter (Signed)
Pt stated she is almost of of the refill she received from [facility] and would like a refill before she runs out /Pt stated it was also liquid and it didn't help as much as the pills Dr. Wynetta Emery prescribed before/ please advise

## 2020-09-08 NOTE — Telephone Encounter (Signed)
Contacted pt to go over provider message pt didn't answer lvm

## 2020-09-09 NOTE — Telephone Encounter (Signed)
Contacted pt yesterday 6/2 and left a detailed vm making her aware of Dr. Wynetta Emery response

## 2020-09-12 ENCOUNTER — Other Ambulatory Visit (HOSPITAL_COMMUNITY): Payer: Self-pay

## 2020-09-13 ENCOUNTER — Encounter: Payer: Self-pay | Admitting: Oncology

## 2020-09-13 ENCOUNTER — Other Ambulatory Visit: Payer: Self-pay

## 2020-09-13 ENCOUNTER — Other Ambulatory Visit (HOSPITAL_COMMUNITY): Payer: Self-pay

## 2020-09-13 ENCOUNTER — Encounter: Payer: Self-pay | Admitting: Internal Medicine

## 2020-09-13 ENCOUNTER — Other Ambulatory Visit: Payer: Self-pay | Admitting: Oncology

## 2020-09-13 ENCOUNTER — Other Ambulatory Visit: Payer: Self-pay | Admitting: Internal Medicine

## 2020-09-13 ENCOUNTER — Ambulatory Visit: Payer: Medicaid Other | Attending: Internal Medicine | Admitting: Internal Medicine

## 2020-09-13 VITALS — BP 150/80 | HR 67 | Resp 16 | Wt 85.2 lb

## 2020-09-13 DIAGNOSIS — Z9221 Personal history of antineoplastic chemotherapy: Secondary | ICD-10-CM | POA: Insufficient documentation

## 2020-09-13 DIAGNOSIS — M546 Pain in thoracic spine: Secondary | ICD-10-CM | POA: Insufficient documentation

## 2020-09-13 DIAGNOSIS — Z79899 Other long term (current) drug therapy: Secondary | ICD-10-CM | POA: Insufficient documentation

## 2020-09-13 DIAGNOSIS — I10 Essential (primary) hypertension: Secondary | ICD-10-CM

## 2020-09-13 DIAGNOSIS — Z923 Personal history of irradiation: Secondary | ICD-10-CM | POA: Insufficient documentation

## 2020-09-13 DIAGNOSIS — C159 Malignant neoplasm of esophagus, unspecified: Secondary | ICD-10-CM | POA: Diagnosis not present

## 2020-09-13 DIAGNOSIS — Z87891 Personal history of nicotine dependence: Secondary | ICD-10-CM | POA: Diagnosis not present

## 2020-09-13 DIAGNOSIS — G8929 Other chronic pain: Secondary | ICD-10-CM | POA: Insufficient documentation

## 2020-09-13 DIAGNOSIS — I5022 Chronic systolic (congestive) heart failure: Secondary | ICD-10-CM | POA: Insufficient documentation

## 2020-09-13 DIAGNOSIS — I110 Hypertensive heart disease with heart failure: Secondary | ICD-10-CM | POA: Diagnosis not present

## 2020-09-13 MED ORDER — TRAMADOL HCL 50 MG PO TABS
50.0000 mg | ORAL_TABLET | Freq: Three times a day (TID) | ORAL | 0 refills | Status: DC | PRN
  Filled 2020-09-13: qty 90, 30d supply, fill #0

## 2020-09-13 MED ORDER — PROCHLORPERAZINE MALEATE 5 MG PO TABS
ORAL_TABLET | Freq: Four times a day (QID) | ORAL | 0 refills | Status: AC | PRN
  Filled 2020-09-13: qty 60, 15d supply, fill #0

## 2020-09-13 NOTE — Telephone Encounter (Signed)
open in error
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
## 2020-09-13 NOTE — Telephone Encounter (Signed)
  Notes to clinic: medication requested was d/c  Review for continued use and refill   Requested Prescriptions  Pending Prescriptions Disp Refills   traMADol (ULTRAM) 50 MG tablet 40 tablet 0    Sig: Take 1 tablet (50 mg total) by mouth 2 (two) times daily as needed.      Not Delegated - Analgesics:  Opioid Agonists Failed - 09/13/2020  1:06 PM      Failed - This refill cannot be delegated      Failed - Urine Drug Screen completed in last 360 days      Passed - Valid encounter within last 6 months    Recent Outpatient Visits           1 month ago Hospital discharge follow-up   [place], MD   7 months ago Erroneous encounter - disregard   [place], [place], NP   8 months ago Need for influenza vaccination   [place], Jarome [place], RPH-CPP   8 months ago Acute pain of left knee   [place], MD   10 months ago Oropharyngeal dysphagia   [place], Deborah B, MD       Future Appointments             Today Ladell Pier, MD [place]   In 4 weeks  [place], [place]   In 3 months Donato Heinz, MD [place], [facility]

## 2020-09-14 ENCOUNTER — Other Ambulatory Visit (HOSPITAL_COMMUNITY): Payer: Self-pay

## 2020-09-14 ENCOUNTER — Encounter: Payer: Self-pay | Admitting: Oncology

## 2020-09-16 ENCOUNTER — Other Ambulatory Visit (HOSPITAL_COMMUNITY): Payer: Self-pay

## 2020-09-20 ENCOUNTER — Encounter (HOSPITAL_COMMUNITY): Payer: Self-pay | Admitting: Gastroenterology

## 2020-09-20 ENCOUNTER — Other Ambulatory Visit: Payer: Self-pay

## 2020-09-20 NOTE — Progress Notes (Deleted)
I was unable to reach Ms Badley  by phone.  I left  A message on voice mail.  I instructed the patient to arrive at [facility] entrance at --  , register in the [facility]. DO NOT eat or drink anything after midnight.  I instructed the patient to take the following medications in the am with just enough water to get them down:  I asked patient to not wear any lotions, powders, cologne, jewelry, piercing, make-up or nail polish.  Wear clean clothes. Brush teeth. I informed patient that there will need to be a driver and someone to stay with him/her for the first 24 hours after surgery.   I instructed  patient to call [phone], in the am if there were any questions or problems.

## 2020-09-21 ENCOUNTER — Ambulatory Visit (HOSPITAL_COMMUNITY): Payer: Medicaid Other

## 2020-09-21 ENCOUNTER — Ambulatory Visit (HOSPITAL_COMMUNITY): Payer: Medicaid Other | Admitting: Certified Registered"

## 2020-09-21 ENCOUNTER — Other Ambulatory Visit (HOSPITAL_COMMUNITY): Payer: Self-pay

## 2020-09-21 ENCOUNTER — Other Ambulatory Visit: Payer: Self-pay

## 2020-09-21 ENCOUNTER — Encounter (HOSPITAL_COMMUNITY): Payer: Self-pay | Admitting: Gastroenterology

## 2020-09-21 ENCOUNTER — Ambulatory Visit (HOSPITAL_COMMUNITY)
Admission: RE | Admit: 2020-09-21 | Discharge: 2020-09-21 | Disposition: A | Discharge status: Home / Self Care | Payer: Medicaid Other | Attending: Gastroenterology | Admitting: Gastroenterology

## 2020-09-21 ENCOUNTER — Encounter (HOSPITAL_COMMUNITY)
Admission: RE | Disposition: A | Discharge status: Home / Self Care | Payer: Self-pay | Source: Home / Self Care | Attending: Gastroenterology

## 2020-09-21 DIAGNOSIS — Z8501 Personal history of malignant neoplasm of esophagus: Secondary | ICD-10-CM | POA: Diagnosis not present

## 2020-09-21 DIAGNOSIS — Q396 Congenital diverticulum of esophagus: Secondary | ICD-10-CM | POA: Insufficient documentation

## 2020-09-21 DIAGNOSIS — K222 Esophageal obstruction: Secondary | ICD-10-CM | POA: Diagnosis not present

## 2020-09-21 DIAGNOSIS — Z9221 Personal history of antineoplastic chemotherapy: Secondary | ICD-10-CM | POA: Diagnosis not present

## 2020-09-21 DIAGNOSIS — I5020 Unspecified systolic (congestive) heart failure: Secondary | ICD-10-CM | POA: Diagnosis not present

## 2020-09-21 DIAGNOSIS — Z833 Family history of diabetes mellitus: Secondary | ICD-10-CM | POA: Diagnosis not present

## 2020-09-21 DIAGNOSIS — R1310 Dysphagia, unspecified: Secondary | ICD-10-CM | POA: Insufficient documentation

## 2020-09-21 DIAGNOSIS — R1319 Other dysphagia: Secondary | ICD-10-CM

## 2020-09-21 DIAGNOSIS — Z923 Personal history of irradiation: Secondary | ICD-10-CM | POA: Diagnosis not present

## 2020-09-21 DIAGNOSIS — Z419 Encounter for procedure for purposes other than remedying health state, unspecified: Secondary | ICD-10-CM

## 2020-09-21 DIAGNOSIS — I110 Hypertensive heart disease with heart failure: Secondary | ICD-10-CM | POA: Diagnosis not present

## 2020-09-21 DIAGNOSIS — Z800 Family history of malignant neoplasm of digestive organs: Secondary | ICD-10-CM | POA: Diagnosis not present

## 2020-09-21 DIAGNOSIS — Z87891 Personal history of nicotine dependence: Secondary | ICD-10-CM | POA: Diagnosis not present

## 2020-09-21 DIAGNOSIS — Z801 Family history of malignant neoplasm of trachea, bronchus and lung: Secondary | ICD-10-CM | POA: Diagnosis not present

## 2020-09-21 HISTORY — PX: ESOPHAGOGASTRODUODENOSCOPY (EGD) WITH PROPOFOL: SHX5813

## 2020-09-21 HISTORY — PX: SAVORY DILATION: SHX5439

## 2020-09-21 HISTORY — PX: BIOPSY: SHX5522

## 2020-09-21 SURGERY — ESOPHAGOGASTRODUODENOSCOPY (EGD) WITH PROPOFOL
Anesthesia: Monitor Anesthesia Care

## 2020-09-21 MED ORDER — LIDOCAINE 2% (20 MG/ML) 5 ML SYRINGE
INTRAMUSCULAR | Status: DC | PRN
  Administered 2020-09-21: 100 mg via INTRAVENOUS

## 2020-09-21 MED ORDER — OMEPRAZOLE 20 MG PO CPDR
40.0000 mg | DELAYED_RELEASE_CAPSULE | Freq: Two times a day (BID) | ORAL | 6 refills | Status: AC
  Filled 2020-09-21: qty 60, 15d supply, fill #0

## 2020-09-21 MED ORDER — LACTATED RINGERS IV SOLN: INTRAVENOUS | Status: DC | PRN

## 2020-09-21 MED ORDER — PROPOFOL 500 MG/50ML IV EMUL
INTRAVENOUS | Status: DC | PRN
  Administered 2020-09-21: 120 ug/kg/min via INTRAVENOUS

## 2020-09-21 MED ORDER — PROPOFOL 10 MG/ML IV BOLUS
INTRAVENOUS | Status: DC | PRN
  Administered 2020-09-21: 50 mg via INTRAVENOUS

## 2020-09-21 MED ORDER — SUCRALFATE 1 G PO TABS
1.0000 g | ORAL_TABLET | Freq: Two times a day (BID) | ORAL | 6 refills | Status: AC
  Filled 2020-09-21: qty 120, 60d supply, fill #0

## 2020-09-21 MED ORDER — SODIUM CHLORIDE 0.9 % IV SOLN: INTRAVENOUS | Status: DC

## 2020-09-21 SURGICAL SUPPLY — 14 items

## 2020-09-21 NOTE — Anesthesia Postprocedure Evaluation (Signed)
Anesthesia Post Note  Patient: Landree Fernholz  Procedure(s) Performed: ESOPHAGOGASTRODUODENOSCOPY (EGD) WITH PROPOFOL SAVORY DILATION BIOPSY     Patient location during evaluation: Endoscopy Anesthesia Type: MAC Level of consciousness: awake and alert Pain management: pain level controlled Vital Signs Assessment: post-procedure vital signs reviewed and stable Respiratory status: spontaneous breathing, nonlabored ventilation, respiratory function stable and patient connected to nasal cannula oxygen Cardiovascular status: blood pressure returned to baseline and stable Postop Assessment: no apparent nausea or vomiting Anesthetic complications: no   No notable events documented.  Last Vitals:  Vitals:   09/21/20 0930 09/21/20 0950  BP: (!) 186/98 (!) 189/90  Pulse: 67 70  Resp: 20 (!) 21  Temp:    SpO2: 98% 97%    Last Pain:  Vitals:   09/21/20 0950  TempSrc:   PainSc: 0-No pain                 Ki Corbo L Yusuke Beza

## 2020-09-21 NOTE — Anesthesia Preprocedure Evaluation (Addendum)
Anesthesia Evaluation  Patient identified by MRN, date of birth, ID band Patient awake    Reviewed: Allergy & Precautions, NPO status , Patient's Chart, lab work & pertinent test results, reviewed documented beta blocker date and time   Airway Mallampati: II  TM Distance: >3 FB Neck ROM: Full    Dental  (+) Edentulous Upper, Edentulous Lower, Upper Dentures, Lower Dentures   Pulmonary neg pulmonary ROS, former smoker,    Pulmonary exam normal breath sounds clear to auscultation       Cardiovascular hypertension, Pt. on home beta blockers and Pt. on medications +CHF  Normal cardiovascular exam+ Valvular Problems/Murmurs MR  Rhythm:Regular Rate:Normal  TTE 2021 1. Left ventricular ejection fraction, by estimation, is 20 to 25%. The left ventricle has severely decreased function. The left ventricle demonstrates global hypokinesis. Left ventricular diastolic parameters are consistent with Grade I diastolic dysfunction (impaired relaxation).  2. Right ventricular systolic function is mildly reduced. The right ventricular size is normal. Tricuspid regurgitation signal is inadequate for assessing PA pressure.  3. Left atrial size was mildly dilated.  4. The mitral valve is normal in structure. Moderate central mitral valve regurgitation, likely functional. No evidence of mitral stenosis.  5. The aortic valve is tricuspid. Aortic valve regurgitation is not visualized. No aortic stenosis is present.  6. The inferior vena cava is normal in size with greater than 50% respiratory variability, suggesting right atrial pressure of 3 mmHg.  7. Consider referral to CHF clinic.   Neuro/Psych PSYCHIATRIC DISORDERS Depression Bipolar Disorder negative neurological ROS     GI/Hepatic GERD  Medicated,(+)     substance abuse  alcohol use, Hepatitis -, C  Endo/Other  negative endocrine ROS  Renal/GU negative Renal ROS  negative genitourinary    Musculoskeletal negative musculoskeletal ROS (+)   Abdominal   Peds  Hematology negative hematology ROS (+)   Anesthesia Other Findings EGD with dilation with history of esophageal cancer and cervical webs  Reproductive/Obstetrics                            Anesthesia Physical Anesthesia Plan  ASA: 4  Anesthesia Plan: MAC   Post-op Pain Management:    Induction: Intravenous  PONV Risk Score and Plan: 2 and Propofol infusion and Treatment may vary due to age or medical condition  Airway Management Planned: Natural Airway  Additional Equipment:   Intra-op Plan:   Post-operative Plan:   Informed Consent: I have reviewed the patients History and Physical, chart, labs and discussed the procedure including the risks, benefits and alternatives for the proposed anesthesia with the patient or authorized representative who has indicated his/her understanding and acceptance.     Dental advisory given  Plan Discussed with: CRNA  Anesthesia Plan Comments:         Anesthesia Quick Evaluation

## 2020-09-21 NOTE — Interval H&P Note (Signed)
History and Physical Interval Note:  09/21/2020 7:43 AM  Alexandria Buck  has presented today for surgery, with the diagnosis of esophageal stricture- dysphagia.  The various methods of treatment have been discussed with the patient and family. After consideration of risks, benefits and other options for treatment, the patient has consented to  Procedure(s): ESOPHAGOGASTRODUODENOSCOPY (EGD) WITH PROPOFOL (N/A) SAVORY DILATION (N/A) as a surgical intervention.  The patient's history has been reviewed, patient examined, no change in status, stable for surgery.  I have reviewed the patient's chart and labs.  Questions were answered to the patient's satisfaction.     [employer]

## 2020-09-21 NOTE — Progress Notes (Signed)
The proposed treatment discussed in conference is for discussion purposes only and is not a binding recommendation.  The patients have not been physically examined, or presented with their treatment options.  Therefore, final treatment plans cannot be decided.   

## 2020-09-21 NOTE — Discharge Instructions (Signed)
YOU HAD AN ENDOSCOPIC PROCEDURE TODAY: Refer to the procedure report and other information in the discharge instructions given to you for any specific questions about what was found during the examination. If this information does not answer your questions, please call [facility] office at [phone] to clarify.   YOU SHOULD EXPECT: Some feelings of bloating in the abdomen. Passage of more gas than usual. Walking can help get rid of the air that was put into your GI tract during the procedure and reduce the bloating. If you had a lower endoscopy (such as a colonoscopy or flexible sigmoidoscopy) you may notice spotting of blood in your stool or on the toilet paper. Some abdominal soreness may be present for a day or two, also.  DIET: Your first meal following the procedure should be a light meal and then it is ok to progress to your normal diet. A half-sandwich or bowl of soup is an example of a good first meal. Heavy or fried foods are harder to digest and may make you feel nauseous or bloated. Drink plenty of fluids but you should avoid alcoholic beverages for 24 hours. If you had a esophageal dilation, please see attached instructions for diet.    ACTIVITY: Your care partner should take you home directly after the procedure. You should plan to take it easy, moving slowly for the rest of the day. You can resume normal activity the day after the procedure however YOU SHOULD NOT DRIVE, use power tools, machinery or perform tasks that involve climbing or major physical exertion for 24 hours (because of the sedation medicines used during the test).   SYMPTOMS TO REPORT IMMEDIATELY: A gastroenterologist can be reached at any hour. Please call [phone]  for any of the following symptoms:   Following upper endoscopy (EGD, EUS, ERCP, esophageal dilation) Vomiting of blood or coffee ground material  New, significant abdominal pain  New, significant chest pain or pain under the shoulder blades  Painful or  persistently difficult swallowing  New shortness of breath  Black, tarry-looking or red, bloody stools  FOLLOW UP:  If any biopsies were taken you will be contacted by phone or by letter within the next 1-3 weeks. Call [phone]  if you have not heard about the biopsies in 3 weeks.  Please also call with any specific questions about appointments or follow up tests.  

## 2020-09-21 NOTE — Transfer of Care (Signed)
Immediate Anesthesia Transfer of Care Note  Patient: Alexandria Buck  Procedure(s) Performed: ESOPHAGOGASTRODUODENOSCOPY (EGD) WITH PROPOFOL SAVORY DILATION BIOPSY  Patient Location: Endoscopy Unit  Anesthesia Type:MAC  Level of Consciousness: drowsy and patient cooperative  Airway & Oxygen Therapy: Patient Spontanous Breathing  Post-op Assessment: Report given to RN and Post -op Vital signs reviewed and stable  Post vital signs: Reviewed and stable  Last Vitals:  Vitals Value Taken Time  BP 183/98 09/21/20 0911  Temp    Pulse    Resp 20 09/21/20 0911  SpO2    Vitals shown include unvalidated device data.  Last Pain:  Vitals:   09/21/20 0700  TempSrc: Temporal  PainSc: 8          Complications: No notable events documented.

## 2020-09-21 NOTE — Op Note (Signed)
[facility] Patient Name: Alexandria Buck Procedure Date : 09/21/2020 MRN: [id number] Attending MD: Justice Britain , MD Date of Birth: 1961-03-30 CSN: [id number] Age: 60 Admit Type: Inpatient Procedure:                Upper GI endoscopy Indications:              Dysphagia, Personal history of malignant esophageal                            neoplasm Providers:                Justice Britain, MD, Nelia Shi, RN,                            Elspeth Cho Tech., Technician, Lance Coon,                            CRNA Referring MD:             Izola Price. Izola Price Medicines:                Monitored Anesthesia Care Complications:            No immediate complications. Estimated Blood Loss:     Estimated blood loss was minimal. Procedure:                Pre-Anesthesia Assessment:                           - Prior to the procedure, a History and Physical                            was performed, and patient medications and                            allergies were reviewed. The patient's tolerance of                            previous anesthesia was also reviewed. The risks                            and benefits of the procedure and the sedation                            options and risks were discussed with the patient.                            All questions were answered, and informed consent                            was obtained. Prior Anticoagulants: The patient has                            taken no previous anticoagulant or antiplatelet                            agents.  ASA Grade Assessment: III - A patient with                            severe systemic disease. After reviewing the risks                            and benefits, the patient was deemed in                            satisfactory condition to undergo the procedure.                           After obtaining informed consent, the endoscope was                             passed under direct vision. Throughout the                            procedure, the patient's blood pressure, pulse, and                            oxygen saturations were monitored continuously. The                            GIF-H190 ([id number]) Olympus gastroscope was                            introduced through the mouth, and advanced to the                            second part of duodenum. The upper GI endoscopy was                            accomplished without difficulty. The patient                            tolerated the procedure. Scope In: Scope Out: Findings:      One benign-appearing, intrinsic moderate (circumferential scarring or       stenosis; an endoscope may pass) stenosis was found 16 to 18 cm from the       incisors. This stenosis measured 1 cm (inner diameter). The stenosis was       traversed.      One benign-appearing, intrinsic moderate (circumferential scarring or       stenosis; an endoscope may pass) stenosis was found 24 to 26 cm from the       incisors. This stenosis measured 1.1 cm (inner diameter). The stenosis       was traversed.      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      White nummular lesions were noted in the distal esophagus. Biopsies were       taken with a  cold forceps for histology to rule out persistent Candida       (just completed treatment).      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      The Z-line was irregular and was found 37 cm from the incisors.      After the rest of the EGD was complete, a guidewire was placed under       fluoroscopic guidance and the scope was withdrawn. Dilation was       performed in the entire esophagus with a Savary dilator with no       resistance at 11 mm and 12 mm, mild resistance at  12.8 mm and moderate       resistance at 14 mm. The dilation site was examined following each       dilation with endoscope reinsertion and by the end showed moderate       mucosal disruption, complete resolution of luminal narrowing and no       perforation. Oozing noted as would be expected from mucosal wrents      A 3 cm hiatal hernia was present.      No gross lesions were noted in the entire examined stomach.      No gross lesions were noted in the duodenal bulb, in the first portion       of the duodenum and in the second portion of the duodenum. Impression:               - Two benign-appearing esophageal stenoses noted as                            described above. Dilation performed with mucosal                            wrents noted.                           - Esophageal pouch noted in mid-esophagus in region                            of SCC s/p Chemo-XRT (recently biopsied and                            returned showing persistent disease) from 25-28 cm.                           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                           - White nummular lesions in esophageal mucosa.                            Biopsied.                           - Z-line irregular, 37 cm from the incisors.                           - 3 cm hiatal hernia.                           -  No gross lesions in the stomach.                           - No gross lesions in the duodenal bulb, in the                            first portion of the duodenum and in the second                            portion of the duodenum. Recommendation:           - The patient will be observed post-procedure,                            until all discharge criteria are met.                           - Discharge patient to home.                           - Patient has a contact number available for                            emergencies. The signs and symptoms of potential                             delayed complications were discussed with the                            patient. Return to normal activities tomorrow.                            Written discharge instructions were provided to the                            patient.                           - Dilation diet as per protocol. Will be on clears                            today until dinner and then can advance to full                            liquids. If doing well tomorrow can increase to                            soft diet.                           - Please use Cepacol or Halls Lozenges +/-                            Chloraseptic spray for next 72-96 hours to aid in  sore thoat should you experience this.                           - Monitor for signs/symptoms of bleeding,                            perforation, and infection. If issues please call                            our number to get further assistance as needed.                           - Observe patient's clinical course.                           - Await pathology results.                           - Repeat EGD PRN for dilitation. I think there is                            high risk of perforation for patient if I try to go                            further based on how large the mucosal wrents have                            been. Thus will be based on patient symptoms for [place]                            to perform. Would perform further procedures in                            Hospital based setting as Fluroscopy could be                            required.                           - Repeat upper endoscopy in 6 months for                            surveillance if not already done.                           - Recommend a Barium Swallow to evaluate the                            esophagus lining further.                           - The findings and recommendations were discussed  with the  patient. Procedure Code(s):        --- Professional ---                           [phone], Esophagogastroduodenoscopy, flexible,                            transoral; with insertion of guide wire followed by                            passage of dilator(s) through esophagus over guide                            wire Diagnosis Code(s):        --- Professional ---                           K22.2, Esophageal obstruction                           Q39.6, Congenital diverticulum of esophagus                           K22.8, Other specified diseases of esophagus                           K44.9, Diaphragmatic hernia without obstruction or                            gangrene                           R13.10, Dysphagia, unspecified                           Z85.01, Personal history of malignant neoplasm of                            esophagus CPT copyright 2019 [employer]. All rights reserved. The codes documented in this report are preliminary and upon coder review may  be revised to meet current compliance requirements. Justice Britain, MD 09/21/2020 9:19:48 AM Number of Addenda: 0

## 2020-09-22 ENCOUNTER — Other Ambulatory Visit: Payer: Self-pay

## 2020-09-22 ENCOUNTER — Other Ambulatory Visit (HOSPITAL_COMMUNITY): Payer: Self-pay

## 2020-09-22 ENCOUNTER — Encounter: Payer: Self-pay | Admitting: Gastroenterology

## 2020-09-22 ENCOUNTER — Ambulatory Visit (HOSPITAL_COMMUNITY)
Admission: RE | Admit: 2020-09-22 | Discharge: 2020-09-22 | Disposition: A | Discharge status: Home / Self Care | Payer: Medicaid Other | Source: Ambulatory Visit | Attending: Internal Medicine | Admitting: Internal Medicine

## 2020-09-22 ENCOUNTER — Ambulatory Visit (HOSPITAL_COMMUNITY): Admission: RE | Admit: 2020-09-22 | Payer: Medicaid Other | Source: Ambulatory Visit

## 2020-09-22 DIAGNOSIS — M546 Pain in thoracic spine: Secondary | ICD-10-CM | POA: Diagnosis present

## 2020-09-22 LAB — SURGICAL PATHOLOGY

## 2020-09-23 ENCOUNTER — Encounter (HOSPITAL_COMMUNITY): Payer: Self-pay | Admitting: Gastroenterology

## 2020-09-23 ENCOUNTER — Telehealth: Payer: Self-pay | Admitting: Internal Medicine

## 2020-09-23 ENCOUNTER — Encounter: Payer: Self-pay | Admitting: Oncology

## 2020-09-26 ENCOUNTER — Other Ambulatory Visit (HOSPITAL_COMMUNITY): Payer: Self-pay

## 2020-09-26 ENCOUNTER — Encounter: Payer: Self-pay | Admitting: Nurse Practitioner

## 2020-09-26 ENCOUNTER — Other Ambulatory Visit: Payer: Self-pay

## 2020-09-26 ENCOUNTER — Inpatient Hospital Stay: Payer: Medicaid Other | Attending: Nurse Practitioner | Admitting: Nurse Practitioner

## 2020-09-26 VITALS — BP 160/110 | HR 81 | Temp 97.8°F | Resp 18 | Ht 61.0 in | Wt 78.6 lb

## 2020-09-26 DIAGNOSIS — Z7189 Other specified counseling: Secondary | ICD-10-CM

## 2020-09-26 DIAGNOSIS — I10 Essential (primary) hypertension: Secondary | ICD-10-CM | POA: Diagnosis not present

## 2020-09-26 DIAGNOSIS — C154 Malignant neoplasm of middle third of esophagus: Secondary | ICD-10-CM | POA: Diagnosis present

## 2020-09-26 DIAGNOSIS — M549 Dorsalgia, unspecified: Secondary | ICD-10-CM | POA: Diagnosis not present

## 2020-09-26 MED ORDER — AMITRIPTYLINE HCL 25 MG PO TABS
25.0000 mg | ORAL_TABLET | Freq: Every day | ORAL | 0 refills | Status: AC
Start: 2020-09-26 — End: ?
  Filled 2020-09-26: qty 30, 30d supply, fill #0

## 2020-09-26 NOTE — Progress Notes (Signed)
[facility] OFFICE PROGRESS NOTE   Diagnosis: Esophagus cancer  INTERVAL HISTORY:   [name] returns as scheduled.  Main complaint continues to be upper back pain.  Hycet partially effective.  She reports Ultram is not effective.  She is no longer having dysphagia.  She reports oral intake is poor due to pain.  This has resulted in weight loss.  No headaches.  No shortness of breath.  Chest pain corresponding to the area of back pain.  Objective:  Vital signs in last 24 hours:  Blood pressure (!) 169/103, pulse 81, temperature 97.8 F (36.6 C), resp. rate 18, height _0  (1.549 m), weight 78 lb 9.6 oz (35.7 kg), SpO2 99 %.    Resp: Distant breath sounds.  No wheezes or rales.  No respiratory distress. Cardio: Regular rate and rhythm. GI: No hepatomegaly. Vascular: No leg edema. Neuro: Alert and oriented. Skin: Hyperpigmentation mid back.  Marked tenderness with light palpation in the area of hyperpigmentation.   Lab Results:  Lab Results  Component Value Date   WBC 5.3 07/27/2020   HGB 12.3 07/27/2020   HCT 38.8 07/27/2020   MCV 92.4 07/27/2020   PLT 243 07/27/2020   NEUTROABS 3.2 07/27/2020    Imaging:  No results found.  Medications: I have reviewed the patient's current medications.  Assessment/Plan: Squamous cell carcinoma of the esophagus Endoscopy 01/14/2020-partially obstructing mid esophagus mass, squamous cell carcinoma, PD-L1 CPS 10% PET scan 01/25/2020-hypermetabolic mid esophagus mass, mildly hypermetabolic right axillary lymph nodes Radiation 02/09/2020-03/22/2020 Cycle 1 Taxol/carboplatin 02/10/2020 Cycle 2 Taxol/carboplatin 02/17/2020 Cycle 3 Taxol/carboplatin 02/24/2020 Chemotherapy held 03/02/2020 due to neutropenia Cycle 4 Taxol/carboplatin 03/09/2020 (Taxol dose reduced due to previous neutropenia) Cycle 5 Taxol/carboplatin 03/16/2020 CT chest 06/07/2020-indistinct tissue planes in the mediastinum along the esophagus following  radiation therapy.  No mass lesion identified.  No new mediastinal lymphadenopathy.  Stable right hilar lymph node. CT chest/abdomen/pelvis 07/28/2020- focal outpouching of the lateral thoracic esophagus near the level of the carina corresponding to the patient's previously seen site of malignancy.  Some mild hazy paraesophageal margins and stranding in the posterior mediastinum about the esophagus itself which is nonspecific.  No large or current focal mass lesion seen.  No significant residual paraesophageal adenopathy seen. Upper endoscopy 08/31/2020- benign-appearing esophageal stenosis just below the UES.  Benign-appearing esophageal stenosis at the region of previous SCC.  This leads to an esophageal pouch/ulceration, ulcerations biopsy.  White nummular lesions in the esophageal mucosa and distal esophagus, biopsied.  Dilatation performed in the entire esophagus.  Pathology on distal esophageal biopsy showed inflamed squamous mucosa with fungus consistent with candidal esophagitis.  No dysplasia or malignancy.  Esophagus biopsy showed a small fragment of squamous cell carcinoma associated with abundant necrosis and exudate. Benign upper esophageal stricture, status post dilation 01/14/2020, 02/08/2020 H. pylori gastritis, treated with medical therapy October 2021 Cardiomyopathy Bipolar disorder Tobacco and alcohol use Hypertension COVID-19 vaccination, booster 04/29/2020 Urinary tract infection 02/16/2020 Odynophagia secondary to radiation-resolved  Disposition: Alexandria Buck appears unchanged.  She is accompanied to today's visit by her daughter.  They understand she has local persistence/progression of esophagus cancer.  Her case was presented at the GI tumor conference.  The recommendation is for immunotherapy.  Dr. Benay Spice discussed Pembrolizumab to initially be given on a 3-week schedule.  We reviewed potential toxicities including skin rash, diarrhea, pneumonitis, hepatitis, myocarditis,  endocrinopathies, infusion reaction.  She was provided with written information on Pembrolizumab as well.  She agrees to proceed.  She will  return for cycle 1 Pembrolizumab in 1 week.  Etiology of the back pain remains unclear.  She has been referred for an MRI of the thoracic spine.  Dr. Benay Spice discussed the possibility of neuropathic pain.  She will begin Elavil 25 mg at bedtime, Tylenol or ibuprofen as needed.  Blood pressure elevated on multiple readings in the office today.  She reports blood pressure becomes elevated when she is in pain.  She further reports normal blood pressure readings at home.  She will follow-up with Dr. Wynetta Emery with persistent elevated readings.  She will return for the first cycle of Pembrolizumab in 1 week.  We will see her in follow-up prior to cycle 2 in 4 weeks.  She will contact the office in the interim with any problems.  Patient seen with Dr. Benay Spice.    Ned Card ANP/GNP-BC   09/26/2020  11:31 AM Alexandria Buck was interviewed and examined.  We again discussed the recent finding of squamous cell carcinoma on an esophageal biopsy.  We discussed treatment options.  Her case was presented at the GI tumor conference last week.  The consensus recommendation is to proceed with immunotherapy.  She is not a surgical candidate.  We discussed potential toxicities associated with pembrolizumab.  She agrees to proceed.  The etiology of her pain is unclear.  We feel it is unlikely her pain is related to the microscopic focus of cancer found on the endoscopy.  There is no other clinical or radiologic evidence of cancer to date.  She will begin a trial of Elavil and continue Tylenol/ibuprofen as needed.  She has been referred for a thoracic MRI.  A treatment plan was entered today.  I was present for greater than 50% of today's visit.  I performed medical decision making.   Julieanne Manson, MD

## 2020-09-27 NOTE — Telephone Encounter (Signed)
Contacted pt and spoke with pt daughter Aniceto Boss and per daughter she got the xray done and there must of been a mix of. Made pt daughter aware that I will have the scheduling contact her to reschedule. Pt daughter doesn't have any questions or concerns   Reached out to the scheduling department and spoke to [place] and he will contact pt to get her rescheduled for MRI

## 2020-09-29 ENCOUNTER — Emergency Department (HOSPITAL_COMMUNITY): Payer: Medicaid Other | Admitting: Anesthesiology

## 2020-09-29 ENCOUNTER — Other Ambulatory Visit: Payer: Self-pay

## 2020-09-29 ENCOUNTER — Emergency Department (HOSPITAL_COMMUNITY): Payer: Medicaid Other

## 2020-09-29 ENCOUNTER — Emergency Department (HOSPITAL_COMMUNITY)
Admission: EM | Admit: 2020-09-29 | Discharge: 2020-09-29 | Disposition: A | Discharge status: Home / Self Care | Payer: Medicaid Other | Attending: Emergency Medicine | Admitting: Emergency Medicine

## 2020-09-29 ENCOUNTER — Other Ambulatory Visit (HOSPITAL_COMMUNITY): Payer: Self-pay

## 2020-09-29 ENCOUNTER — Encounter (HOSPITAL_COMMUNITY)
Admission: EM | Disposition: A | Discharge status: Home / Self Care | Payer: Self-pay | Source: Home / Self Care | Attending: Emergency Medicine

## 2020-09-29 ENCOUNTER — Encounter (HOSPITAL_COMMUNITY): Payer: Self-pay | Admitting: Emergency Medicine

## 2020-09-29 DIAGNOSIS — K449 Diaphragmatic hernia without obstruction or gangrene: Secondary | ICD-10-CM | POA: Insufficient documentation

## 2020-09-29 DIAGNOSIS — C154 Malignant neoplasm of middle third of esophagus: Secondary | ICD-10-CM | POA: Diagnosis not present

## 2020-09-29 DIAGNOSIS — K209 Esophagitis, unspecified without bleeding: Secondary | ICD-10-CM | POA: Diagnosis not present

## 2020-09-29 DIAGNOSIS — T189XXA Foreign body of alimentary tract, part unspecified, initial encounter: Secondary | ICD-10-CM | POA: Diagnosis not present

## 2020-09-29 DIAGNOSIS — K2210 Ulcer of esophagus without bleeding: Secondary | ICD-10-CM | POA: Diagnosis not present

## 2020-09-29 DIAGNOSIS — Z800 Family history of malignant neoplasm of digestive organs: Secondary | ICD-10-CM | POA: Insufficient documentation

## 2020-09-29 DIAGNOSIS — B182 Chronic viral hepatitis C: Secondary | ICD-10-CM | POA: Diagnosis not present

## 2020-09-29 DIAGNOSIS — Z79899 Other long term (current) drug therapy: Secondary | ICD-10-CM | POA: Diagnosis not present

## 2020-09-29 DIAGNOSIS — I110 Hypertensive heart disease with heart failure: Secondary | ICD-10-CM | POA: Diagnosis not present

## 2020-09-29 DIAGNOSIS — K222 Esophageal obstruction: Secondary | ICD-10-CM

## 2020-09-29 DIAGNOSIS — Z20822 Contact with and (suspected) exposure to covid-19: Secondary | ICD-10-CM | POA: Diagnosis not present

## 2020-09-29 DIAGNOSIS — I5022 Chronic systolic (congestive) heart failure: Secondary | ICD-10-CM | POA: Diagnosis not present

## 2020-09-29 DIAGNOSIS — R1310 Dysphagia, unspecified: Secondary | ICD-10-CM | POA: Diagnosis present

## 2020-09-29 DIAGNOSIS — Z9221 Personal history of antineoplastic chemotherapy: Secondary | ICD-10-CM | POA: Diagnosis not present

## 2020-09-29 DIAGNOSIS — I340 Nonrheumatic mitral (valve) insufficiency: Secondary | ICD-10-CM | POA: Diagnosis not present

## 2020-09-29 DIAGNOSIS — Z87891 Personal history of nicotine dependence: Secondary | ICD-10-CM | POA: Insufficient documentation

## 2020-09-29 DIAGNOSIS — Z801 Family history of malignant neoplasm of trachea, bronchus and lung: Secondary | ICD-10-CM | POA: Diagnosis not present

## 2020-09-29 DIAGNOSIS — I255 Ischemic cardiomyopathy: Secondary | ICD-10-CM | POA: Insufficient documentation

## 2020-09-29 DIAGNOSIS — Z8379 Family history of other diseases of the digestive system: Secondary | ICD-10-CM | POA: Diagnosis not present

## 2020-09-29 DIAGNOSIS — Z833 Family history of diabetes mellitus: Secondary | ICD-10-CM | POA: Insufficient documentation

## 2020-09-29 DIAGNOSIS — Z923 Personal history of irradiation: Secondary | ICD-10-CM | POA: Insufficient documentation

## 2020-09-29 DIAGNOSIS — K2289 Other specified disease of esophagus: Secondary | ICD-10-CM | POA: Insufficient documentation

## 2020-09-29 HISTORY — PX: ESOPHAGOGASTRODUODENOSCOPY: SHX5428

## 2020-09-29 LAB — COMPREHENSIVE METABOLIC PANEL
ALT: 10 U/L (ref 0–44)
AST: 24 U/L (ref 15–41)
Albumin: 4.1 g/dL (ref 3.5–5.0)
Alkaline Phosphatase: 60 U/L (ref 38–126)
Anion gap: 12 (ref 5–15)
BUN: 20 mg/dL (ref 8–23)
CO2: 25 mmol/L (ref 22–32)
Calcium: 9.5 mg/dL (ref 8.9–10.3)
Chloride: 98 mmol/L (ref 98–111)
Creatinine, Ser: 1.24 mg/dL — ABNORMAL HIGH (ref 0.44–1.00)
GFR, Estimated: 50 mL/min — ABNORMAL LOW (ref >60)
Glucose, Bld: 96 mg/dL (ref 70–99)
Potassium: 3.3 mmol/L — ABNORMAL LOW (ref 3.5–5.1)
Sodium: 135 mmol/L (ref 135–145)
Total Bilirubin: 0.4 mg/dL (ref 0.3–1.2)
Total Protein: 9.8 g/dL — ABNORMAL HIGH (ref 6.5–8.1)

## 2020-09-29 LAB — CBC WITH DIFFERENTIAL/PLATELET
Abs Immature Granulocytes: 0.03 10*3/uL (ref 0.00–0.07)
Basophils Absolute: 0 10*3/uL (ref 0.0–0.1)
Basophils Relative: 0 %
Eosinophils Absolute: 0 10*3/uL (ref 0.0–0.5)
Eosinophils Relative: 1 %
HCT: 43.7 % (ref 36.0–46.0)
Hemoglobin: 13.8 g/dL (ref 12.0–15.0)
Immature Granulocytes: 0 %
Lymphocytes Relative: 17 %
Lymphs Abs: 1.2 10*3/uL (ref 0.7–4.0)
MCH: 28.1 pg (ref 26.0–34.0)
MCHC: 31.6 g/dL (ref 30.0–36.0)
MCV: 89 fL (ref 80.0–100.0)
Monocytes Absolute: 0.7 10*3/uL (ref 0.1–1.0)
Monocytes Relative: 10 %
Neutro Abs: 5.1 10*3/uL (ref 1.7–7.7)
Neutrophils Relative %: 72 %
Platelets: 336 10*3/uL (ref 150–400)
RBC: 4.91 MIL/uL (ref 3.87–5.11)
RDW: 13.4 % (ref 11.5–15.5)
WBC: 7 10*3/uL (ref 4.0–10.5)
nRBC: 0 % (ref 0.0–0.2)

## 2020-09-29 LAB — RESP PANEL BY RT-PCR (FLU A&B, COVID) ARPGX2
Influenza A by PCR: NEGATIVE
Influenza B by PCR: NEGATIVE
SARS Coronavirus 2 by RT PCR: NEGATIVE

## 2020-09-29 SURGERY — EGD (ESOPHAGOGASTRODUODENOSCOPY)
Anesthesia: General

## 2020-09-29 MED ORDER — PHENYLEPHRINE 40 MCG/ML (10ML) SYRINGE FOR IV PUSH (FOR BLOOD PRESSURE SUPPORT)
PREFILLED_SYRINGE | INTRAVENOUS | Status: DC | PRN
  Administered 2020-09-29 (×2): 80 ug via INTRAVENOUS

## 2020-09-29 MED ORDER — SUCCINYLCHOLINE CHLORIDE 20 MG/ML IJ SOLN
INTRAMUSCULAR | Status: DC | PRN
  Administered 2020-09-29: 40 mg via INTRAVENOUS

## 2020-09-29 MED ORDER — FENTANYL CITRATE (PF) 100 MCG/2ML IJ SOLN
INTRAMUSCULAR | Status: DC | PRN
  Administered 2020-09-29 (×2): 50 ug via INTRAVENOUS

## 2020-09-29 MED ORDER — SODIUM CHLORIDE 0.9 % IV SOLN: INTRAVENOUS | Status: DC

## 2020-09-29 MED ORDER — LORAZEPAM 2 MG/ML IJ SOLN
1.0000 mg | Freq: Once | INTRAMUSCULAR | Status: AC
  Administered 2020-09-29: 1 mg via INTRAVENOUS
  Filled 2020-09-29: qty 1

## 2020-09-29 MED ORDER — LACTATED RINGERS IV SOLN: INTRAVENOUS | Status: DC

## 2020-09-29 MED ORDER — ESMOLOL HCL 100 MG/10ML IV SOLN
INTRAVENOUS | Status: DC | PRN
  Administered 2020-09-29 (×2): 20 mg via INTRAVENOUS
  Administered 2020-09-29: 30 mg via INTRAVENOUS

## 2020-09-29 MED ORDER — PROPOFOL 10 MG/ML IV BOLUS
INTRAVENOUS | Status: DC | PRN
  Administered 2020-09-29: 100 mg via INTRAVENOUS

## 2020-09-29 MED ORDER — FENTANYL CITRATE (PF) 100 MCG/2ML IJ SOLN
INTRAMUSCULAR | Status: AC
  Filled 2020-09-29: qty 2

## 2020-09-29 MED ORDER — PROPOFOL 10 MG/ML IV BOLUS
INTRAVENOUS | Status: AC
  Filled 2020-09-29: qty 20

## 2020-09-29 MED ORDER — LIDOCAINE 2% (20 MG/ML) 5 ML SYRINGE
INTRAMUSCULAR | Status: DC | PRN
  Administered 2020-09-29: 40 mg via INTRAVENOUS

## 2020-09-29 MED ORDER — ONDANSETRON HCL 4 MG/2ML IJ SOLN
INTRAMUSCULAR | Status: DC | PRN
  Administered 2020-09-29: 4 mg via INTRAVENOUS
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
## 2020-09-29 NOTE — Interval H&P Note (Signed)
History and Physical Interval Note: Have discussed risks / benefits of EGD and removal of foreign body with the patient, she agrees to proceed. All questions answered. She will be electively intubated per anesthesia for this.   09/29/2020 3:30 PM  Alexandria Buck  has presented today for surgery, with the diagnosis of esophageal cancer, esophageal stenoses, dysphagia.  The various methods of treatment have been discussed with the patient and family. After consideration of risks, benefits and other options for treatment, the patient has consented to  Procedure(s): ESOPHAGOGASTRODUODENOSCOPY (EGD) (N/A) as a surgical intervention.  The patient's history has been reviewed, patient examined, no change in status, stable for surgery.  I have reviewed the patient's chart and labs.  Questions were answered to the patient's satisfaction.     [place]

## 2020-09-29 NOTE — Anesthesia Preprocedure Evaluation (Addendum)
Anesthesia Evaluation  Patient identified by MRN, date of birth, ID band Patient awake    Reviewed: Allergy & Precautions, NPO status , Patient's Chart, lab work & pertinent test results  Airway Mallampati: II  TM Distance: >3 FB Neck ROM: Full    Dental  (+) Edentulous Upper, Edentulous Lower   Pulmonary former smoker,    Pulmonary exam normal breath sounds clear to auscultation       Cardiovascular hypertension, Pt. on home beta blockers +CHF   Rhythm:Regular Rate:Tachycardia  ECG: NSR, rate 73  ECHO: 1. Left ventricular ejection fraction, by estimation, is 20 to 25%. The left ventricle has severely decreased function. The left ventricle demonstrates global hypokinesis. Left ventricular diastolic parameters are consistent with Grade I diastolic dysfunction (impaired relaxation).  2. Right ventricular systolic function is mildly reduced. The right ventricular size is normal. Tricuspid regurgitation signal is inadequate for assessing PA pressure.  3. Left atrial size was mildly dilated.  4. The mitral valve is normal in structure. Moderate central mitral valve regurgitation, likely functional. No evidence of mitral stenosis.  5. The aortic valve is tricuspid. Aortic valve regurgitation is not visualized. No aortic stenosis is present.  6. The inferior vena cava is normal in size with greater than 50% respiratory variability, suggesting right atrial pressure of 3 mmHg.  7. Consider referral to CHF clinic.    Neuro/Psych PSYCHIATRIC DISORDERS Depression Bipolar Disorder negative neurological ROS     GI/Hepatic (+) Hepatitis -, C  Endo/Other  negative endocrine ROS  Renal/GU negative Renal ROS     Musculoskeletal negative musculoskeletal ROS (+)   Abdominal   Peds  Hematology negative hematology ROS (+)   Anesthesia Other Findings Esophageal cancer Esophageal stenosis Dysphagia  Reproductive/Obstetrics                             Anesthesia Physical Anesthesia Plan  ASA: 4  Anesthesia Plan: MAC   Post-op Pain Management:    Induction: Intravenous  PONV Risk Score and Plan: 2 and Propofol infusion and Treatment may vary due to age or medical condition  Airway Management Planned: Nasal Cannula  Additional Equipment:   Intra-op Plan:   Post-operative Plan:   Informed Consent: I have reviewed the patients History and Physical, chart, labs and discussed the procedure including the risks, benefits and alternatives for the proposed anesthesia with the patient or authorized representative who has indicated his/her understanding and acceptance.       Plan Discussed with: CRNA  Anesthesia Plan Comments:        Anesthesia Quick Evaluation

## 2020-09-29 NOTE — ED Notes (Signed)
Pt reports she is ready to go and received d/c instructions from Provider in Endo.  EDP notified.  Pt and family aware of d/c follow-up.

## 2020-09-29 NOTE — ED Triage Notes (Signed)
Pt BIBA [facility] from home with c/o tylenol being stuck in throat. Pt does have a hx of throat cancer. Pt also reports she had her esophagus stretched last week. No other complaints at this time.   BP 152/82 HR-96 Spo2 98% room air RR-22

## 2020-09-29 NOTE — Anesthesia Postprocedure Evaluation (Signed)
Anesthesia Post Note  Patient: Alexandria Buck  Procedure(s) Performed: ESOPHAGOGASTRODUODENOSCOPY (EGD)     Patient location during evaluation: Endoscopy Anesthesia Type: General Level of consciousness: awake and alert Pain management: pain level controlled Vital Signs Assessment: post-procedure vital signs reviewed and stable Respiratory status: spontaneous breathing, nonlabored ventilation, respiratory function stable and patient connected to nasal cannula oxygen Cardiovascular status: blood pressure returned to baseline and stable Postop Assessment: no apparent nausea or vomiting Anesthetic complications: no   No notable events documented.  Last Vitals:  Vitals:   09/29/20 1621 09/29/20 1631  BP: (!) 154/94 (!) 150/110  Pulse: (!) 105   Resp: (!) 31 (!) 29  Temp:    SpO2: 96% 98%    Last Pain:  Vitals:   09/29/20 1621  TempSrc: Oral  PainSc: 0-No pain                 Alexandria Buck Alexandria Buck

## 2020-09-29 NOTE — Op Note (Signed)
[facility] Patient Name: Alexandria Buck Procedure Date: 09/29/2020 MRN: [id number] Attending MD: Carlota Raspberry. Prestina Raigoza , MD Date of Birth: 1960/05/30 CSN: [id number] Age: 60 Admit Type: Emergency Department Procedure:                Upper GI endoscopy Indications:              Suspected foreign body in the esophagus - took                            tylenol tablet at 730, sense of globus, intolerant                            to PO, history of esophageal stricturing from [facility] /                            radiation therapy, recently dilated last week Providers:                Remo Lipps P. Havery Moros, MD, Carmie End, RN,                            Lesia Sago, Technician Referring MD:              Medicines:                Monitored Anesthesia Care Complications:            No immediate complications. Estimated blood loss:                            Minimal. Estimated Blood Loss:     Estimated blood loss was minimal. Procedure:                Pre-Anesthesia Assessment:                           - Prior to the procedure, a History and Physical                            was performed, and patient medications and                            allergies were reviewed. The patient's tolerance of                            previous anesthesia was also reviewed. The risks                            and benefits of the procedure and the sedation                            options and risks were discussed with the patient.                            All questions were answered, and informed consent  was obtained. Prior Anticoagulants: The patient has                            taken no previous anticoagulant or antiplatelet                            agents. ASA Grade Assessment: III - A patient with                            severe systemic disease. After reviewing the risks                            and benefits, the patient was deemed in                             satisfactory condition to undergo the procedure.                           After obtaining informed consent, the endoscope was                            passed under direct vision. Throughout the                            procedure, the patient's blood pressure, pulse, and                            oxygen saturations were monitored continuously. The                            GIF-H190 ([id number]) was introduced through the                            mouth, and advanced to the second part of duodenum.                            The upper GI endoscopy was accomplished without                            difficulty. The patient tolerated the procedure                            well. Scope In: Scope Out: Findings:      The Z-line was regular and was found 37 cm from the incisors.      One large known esophageal ulcer from [facility] was found 26 cm from the       incisors along an outpouching of the esophagus. Old heme noted within it.      Esophagitis was found 24 cm from the incisors at site of prior dilation,       healing from prior dilation.      One benign-appearing, intrinsic stenosis was found 18 cm from the       incisors and was traversed.      A superficial mucosal rent was found in the proximal  esophagus at the       UES - unclear if this is healing from prior dilation or mucosal trauma       from pill that had been lodged there? Perhaps causing sense of globus.      The exam of the esophagus was otherwise normal. There is no evidence of       retained pill anywhere in the esophagus.      The entire examined stomach was normal. No retained pills.      The duodenal bulb and second portion of the duodenum were normal. No       retained pills. Impression:               - Z-line regular, 37 cm from the incisors.                           - Known SCC of the mid esophagus as outlined above                           - Esophagitis as outlined above, suspect healing                             from prior dilation last week                           - Benign-appearing esophageal stenosis in the                            proximal esophagus                           - Superficial mucosal rent in the UES - unclear if                            from prior dilation and healing or if from retained                            tablet in that area that has since passed. Suspect                            this could be causing globus symptoms.                           - Normal stomach.                           - Normal duodenal bulb and second portion of the                            duodenum. Moderate Sedation:      No moderate sedation, case performed with MAC Recommendation:           - Patient has a contact number available for                            emergencies. The signs and symptoms of potential  delayed complications were discussed with the                            patient. Return to normal activities tomorrow.                            Written discharge instructions were provided to the                            patient.                           - Advance diet as tolerated. Start with liquids.                           - Continue present medications. Crush tablets or                            change to liquid formulation if possible                           - Follow up with Dr. Rush Landmark in the GI clinic Procedure Code(s):        --- Professional ---                           [phone], Esophagogastroduodenoscopy, flexible,                            transoral; diagnostic, including collection of                            specimen(s) by brushing or washing, when performed                            (separate procedure) Diagnosis Code(s):        --- Professional ---                           K22.10, Ulcer of esophagus without bleeding                           K20.90, Esophagitis, unspecified without bleeding                            K22.2, Esophageal obstruction                           S27.818A, Other injury of esophagus (thoracic                            part), initial encounter                           T18.108A, Unspecified foreign body in esophagus                            causing  other injury, initial encounter CPT copyright 2019 [employer]. All rights reserved. The codes documented in this report are preliminary and upon coder review may  be revised to meet current compliance requirements. Remo Lipps P. Jasiel Belisle, MD 09/29/2020 4:14:31 PM This report has been signed electronically. Number of Addenda: 0

## 2020-09-29 NOTE — Anesthesia Procedure Notes (Signed)
Procedure Name: Intubation Date/Time: 09/29/2020 3:50 PM Performed by: Gean Maidens, CRNA Pre-anesthesia Checklist: Patient identified, Emergency Drugs available, Suction available, Patient being monitored and Timeout performed Patient Re-evaluated:Patient Re-evaluated prior to induction Oxygen Delivery Method: Circle system utilized Preoxygenation: Pre-oxygenation with 100% oxygen Induction Type: IV induction and Rapid sequence Laryngoscope Size: Mac and 3 Grade View: Grade I Tube type: Oral Tube size: 7.0 mm Number of attempts: 1 Airway Equipment and Method: Stylet Placement Confirmation: ETT inserted through vocal cords under direct vision, positive ETCO2 and breath sounds checked- equal and bilateral Secured at: 21 cm Tube secured with: Tape Dental Injury: Teeth and Oropharynx as per pre-operative assessment

## 2020-09-29 NOTE — Transfer of Care (Signed)
Immediate Anesthesia Transfer of Care Note  Patient: Alexandria Buck  Procedure(s) Performed: ESOPHAGOGASTRODUODENOSCOPY (EGD)  Patient Location: PACU  Anesthesia Type:General  Level of Consciousness: sedated, patient cooperative and responds to stimulation  Airway & Oxygen Therapy: Patient Spontanous Breathing and Patient connected to face mask oxygen  Post-op Assessment: Report given to RN and Post -op Vital signs reviewed and stable  Post vital signs: Reviewed and stable  Last Vitals:  Vitals Value Taken Time  BP 146/78 09/29/20 1610  Temp 36.2 C 09/29/20 1610  Pulse 107 09/29/20 1610  Resp 31 09/29/20 1610  SpO2 100 % 09/29/20 1610    Last Pain:  Vitals:   09/29/20 1610  TempSrc: Axillary  PainSc: Asleep         Complications: No notable events documented.
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
## 2020-09-30 ENCOUNTER — Encounter: Payer: Self-pay | Admitting: Gastroenterology

## 2020-09-30 ENCOUNTER — Other Ambulatory Visit (HOSPITAL_COMMUNITY): Payer: Self-pay

## 2020-09-30 ENCOUNTER — Other Ambulatory Visit: Payer: Self-pay

## 2020-09-30 DIAGNOSIS — K222 Esophageal obstruction: Secondary | ICD-10-CM

## 2020-09-30 DIAGNOSIS — R1319 Other dysphagia: Secondary | ICD-10-CM

## 2020-09-30 DIAGNOSIS — C154 Malignant neoplasm of middle third of esophagus: Secondary | ICD-10-CM

## 2020-09-30 NOTE — Progress Notes (Signed)
Case discussed with Dr. Havery Moros who performed EGD on 09/29/2020. I would like to have a barium swallow performed to better evaluate the esophagus. We will work on arranging that and reach out to patient to let her know that that is the next steps in evaluation.  Justice Britain, MD [facility] Advanced [facility]oscopy Office # [phone]

## 2020-09-30 NOTE — Progress Notes (Signed)
Lm on vm for patient to return call.  Order for barium swallow in epic. Staff message sent to radiology schedulers, April Pait and Rhys Martini.

## 2020-10-01 ENCOUNTER — Ambulatory Visit (HOSPITAL_COMMUNITY): Admission: RE | Admit: 2020-10-01 | Payer: Medicaid Other | Source: Ambulatory Visit

## 2020-10-03 ENCOUNTER — Inpatient Hospital Stay: Payer: Medicaid Other

## 2020-10-03 ENCOUNTER — Other Ambulatory Visit: Payer: Self-pay | Admitting: *Deleted

## 2020-10-03 ENCOUNTER — Inpatient Hospital Stay (HOSPITAL_BASED_OUTPATIENT_CLINIC_OR_DEPARTMENT_OTHER): Payer: Medicaid Other | Admitting: Oncology

## 2020-10-03 ENCOUNTER — Other Ambulatory Visit: Payer: Self-pay

## 2020-10-03 ENCOUNTER — Encounter (HOSPITAL_COMMUNITY): Payer: Self-pay | Admitting: Internal Medicine

## 2020-10-03 ENCOUNTER — Telehealth: Payer: Self-pay | Admitting: Oncology

## 2020-10-03 ENCOUNTER — Inpatient Hospital Stay (HOSPITAL_COMMUNITY)
Admission: AD | Admit: 2020-10-03 | Discharge: 2020-10-07 | DRG: 374 | Disposition: E | Payer: Medicaid Other | Source: Ambulatory Visit | Attending: Internal Medicine | Admitting: Internal Medicine

## 2020-10-03 ENCOUNTER — Telehealth: Payer: Self-pay

## 2020-10-03 VITALS — BP 154/109 | HR 93 | Temp 98.0°F | Resp 18 | Ht 61.0 in | Wt 72.4 lb

## 2020-10-03 DIAGNOSIS — Z87891 Personal history of nicotine dependence: Secondary | ICD-10-CM

## 2020-10-03 DIAGNOSIS — I469 Cardiac arrest, cause unspecified: Secondary | ICD-10-CM | POA: Diagnosis present

## 2020-10-03 DIAGNOSIS — J860 Pyothorax with fistula: Secondary | ICD-10-CM | POA: Diagnosis not present

## 2020-10-03 DIAGNOSIS — K2289 Other specified disease of esophagus: Secondary | ICD-10-CM | POA: Diagnosis present

## 2020-10-03 DIAGNOSIS — C154 Malignant neoplasm of middle third of esophagus: Principal | ICD-10-CM | POA: Diagnosis present

## 2020-10-03 DIAGNOSIS — Z923 Personal history of irradiation: Secondary | ICD-10-CM | POA: Diagnosis not present

## 2020-10-03 DIAGNOSIS — R64 Cachexia: Secondary | ICD-10-CM | POA: Diagnosis present

## 2020-10-03 DIAGNOSIS — I5042 Chronic combined systolic (congestive) and diastolic (congestive) heart failure: Secondary | ICD-10-CM | POA: Diagnosis present

## 2020-10-03 DIAGNOSIS — Z681 Body mass index (BMI) 19 or less, adult: Secondary | ICD-10-CM

## 2020-10-03 DIAGNOSIS — Z9221 Personal history of antineoplastic chemotherapy: Secondary | ICD-10-CM

## 2020-10-03 DIAGNOSIS — R059 Cough, unspecified: Secondary | ICD-10-CM | POA: Diagnosis not present

## 2020-10-03 DIAGNOSIS — F1010 Alcohol abuse, uncomplicated: Secondary | ICD-10-CM | POA: Diagnosis present

## 2020-10-03 DIAGNOSIS — B182 Chronic viral hepatitis C: Secondary | ICD-10-CM | POA: Diagnosis present

## 2020-10-03 DIAGNOSIS — E43 Unspecified severe protein-calorie malnutrition: Secondary | ICD-10-CM | POA: Diagnosis present

## 2020-10-03 DIAGNOSIS — Z20822 Contact with and (suspected) exposure to covid-19: Secondary | ICD-10-CM | POA: Diagnosis present

## 2020-10-03 DIAGNOSIS — K222 Esophageal obstruction: Secondary | ICD-10-CM | POA: Diagnosis present

## 2020-10-03 DIAGNOSIS — I428 Other cardiomyopathies: Secondary | ICD-10-CM | POA: Diagnosis present

## 2020-10-03 DIAGNOSIS — E785 Hyperlipidemia, unspecified: Secondary | ICD-10-CM | POA: Diagnosis present

## 2020-10-03 DIAGNOSIS — J690 Pneumonitis due to inhalation of food and vomit: Secondary | ICD-10-CM | POA: Diagnosis not present

## 2020-10-03 DIAGNOSIS — E876 Hypokalemia: Secondary | ICD-10-CM | POA: Diagnosis present

## 2020-10-03 DIAGNOSIS — D630 Anemia in neoplastic disease: Secondary | ICD-10-CM | POA: Diagnosis present

## 2020-10-03 DIAGNOSIS — K2210 Ulcer of esophagus without bleeding: Secondary | ICD-10-CM | POA: Diagnosis present

## 2020-10-03 DIAGNOSIS — E861 Hypovolemia: Secondary | ICD-10-CM | POA: Diagnosis present

## 2020-10-03 DIAGNOSIS — G8929 Other chronic pain: Secondary | ICD-10-CM | POA: Diagnosis present

## 2020-10-03 DIAGNOSIS — M546 Pain in thoracic spine: Secondary | ICD-10-CM | POA: Diagnosis present

## 2020-10-03 DIAGNOSIS — F319 Bipolar disorder, unspecified: Secondary | ICD-10-CM | POA: Diagnosis present

## 2020-10-03 DIAGNOSIS — R1310 Dysphagia, unspecified: Secondary | ICD-10-CM

## 2020-10-03 DIAGNOSIS — Z801 Family history of malignant neoplasm of trachea, bronchus and lung: Secondary | ICD-10-CM

## 2020-10-03 DIAGNOSIS — Y842 Radiological procedure and radiotherapy as the cause of abnormal reaction of the patient, or of later complication, without mention of misadventure at the time of the procedure: Secondary | ICD-10-CM | POA: Diagnosis present

## 2020-10-03 DIAGNOSIS — Z800 Family history of malignant neoplasm of digestive organs: Secondary | ICD-10-CM

## 2020-10-03 DIAGNOSIS — R042 Hemoptysis: Secondary | ICD-10-CM | POA: Diagnosis present

## 2020-10-03 DIAGNOSIS — I110 Hypertensive heart disease with heart failure: Secondary | ICD-10-CM | POA: Diagnosis present

## 2020-10-03 DIAGNOSIS — Z79899 Other long term (current) drug therapy: Secondary | ICD-10-CM

## 2020-10-03 DIAGNOSIS — M5459 Other low back pain: Secondary | ICD-10-CM | POA: Diagnosis not present

## 2020-10-03 DIAGNOSIS — C159 Malignant neoplasm of esophagus, unspecified: Secondary | ICD-10-CM | POA: Diagnosis not present

## 2020-10-03 LAB — CBC WITH DIFFERENTIAL (CANCER CENTER ONLY)
Abs Immature Granulocytes: 0.01 10*3/uL (ref 0.00–0.07)
Basophils Absolute: 0 10*3/uL (ref 0.0–0.1)
Basophils Relative: 0 %
Eosinophils Absolute: 0.1 10*3/uL (ref 0.0–0.5)
Eosinophils Relative: 2 %
HCT: 38.2 % (ref 36.0–46.0)
Hemoglobin: 11.6 g/dL — ABNORMAL LOW (ref 12.0–15.0)
Immature Granulocytes: 0 %
Lymphocytes Relative: 21 %
Lymphs Abs: 1 10*3/uL (ref 0.7–4.0)
MCH: 28.2 pg (ref 26.0–34.0)
MCHC: 30.4 g/dL (ref 30.0–36.0)
MCV: 92.7 fL (ref 80.0–100.0)
Monocytes Absolute: 0.5 10*3/uL (ref 0.1–1.0)
Monocytes Relative: 11 %
Neutro Abs: 3.1 10*3/uL (ref 1.7–7.7)
Neutrophils Relative %: 66 %
Platelet Count: 247 10*3/uL (ref 150–400)
RBC: 4.12 MIL/uL (ref 3.87–5.11)
RDW: 13.7 % (ref 11.5–15.5)
WBC Count: 4.7 10*3/uL (ref 4.0–10.5)
nRBC: 0 % (ref 0.0–0.2)

## 2020-10-03 LAB — CMP (CANCER CENTER ONLY)
ALT: <5 U/L (ref 0–44)
AST: 15 U/L (ref 15–41)
Albumin: 3.9 g/dL (ref 3.5–5.0)
Alkaline Phosphatase: 49 U/L (ref 38–126)
Anion gap: 19 — ABNORMAL HIGH (ref 5–15)
BUN: 19 mg/dL (ref 8–23)
CO2: 22 mmol/L (ref 22–32)
Calcium: 10.4 mg/dL — ABNORMAL HIGH (ref 8.9–10.3)
Chloride: 101 mmol/L (ref 98–111)
Creatinine: 0.75 mg/dL (ref 0.44–1.00)
GFR, Estimated: >60 mL/min (ref >60)
Glucose, Bld: 70 mg/dL (ref 70–99)
Potassium: 3.6 mmol/L (ref 3.5–5.1)
Sodium: 142 mmol/L (ref 135–145)
Total Bilirubin: 0.6 mg/dL (ref 0.3–1.2)
Total Protein: 9.1 g/dL — ABNORMAL HIGH (ref 6.5–8.1)

## 2020-10-03 MED ORDER — CHLORHEXIDINE GLUCONATE 0.12 % MT SOLN
15.0000 mL | Freq: Two times a day (BID) | OROMUCOSAL | Status: DC
  Administered 2020-10-03 – 2020-10-04 (×4): 15 mL via OROMUCOSAL
  Filled 2020-10-03 (×3): qty 15

## 2020-10-03 MED ORDER — MORPHINE SULFATE (PF) 2 MG/ML IV SOLN
2.0000 mg | INTRAVENOUS | Status: DC | PRN
  Administered 2020-10-03 – 2020-10-05 (×16): 2 mg via INTRAVENOUS
  Filled 2020-10-03 (×16): qty 1

## 2020-10-03 MED ORDER — PANTOPRAZOLE SODIUM 40 MG IV SOLR
40.0000 mg | INTRAVENOUS | Status: DC
  Administered 2020-10-03 – 2020-10-04 (×2): 40 mg via INTRAVENOUS
  Filled 2020-10-03: qty 40

## 2020-10-03 MED ORDER — SODIUM CHLORIDE 0.9 % IV SOLN
INTRAVENOUS | Status: DC
  Filled 2020-10-03 (×2): qty 250

## 2020-10-03 MED ORDER — HYDRALAZINE HCL 20 MG/ML IJ SOLN
10.0000 mg | Freq: Three times a day (TID) | INTRAMUSCULAR | Status: DC | PRN
  Administered 2020-10-03: 10 mg via INTRAVENOUS
  Filled 2020-10-03: qty 1

## 2020-10-03 MED ORDER — DEXTROSE-NACL 5-0.9 % IV SOLN: INTRAVENOUS | Status: DC

## 2020-10-03 MED ORDER — MORPHINE SULFATE (PF) 2 MG/ML IV SOLN
2.0000 mg | Freq: Once | INTRAVENOUS | Status: AC | PRN
  Administered 2020-10-03: 2 mg via INTRAVENOUS
  Filled 2020-10-03: qty 1

## 2020-10-03 MED ORDER — ORAL CARE MOUTH RINSE
15.0000 mL | Freq: Two times a day (BID) | OROMUCOSAL | Status: DC
  Administered 2020-10-04: 15 mL via OROMUCOSAL

## 2020-10-03 NOTE — Progress Notes (Signed)
[facility] OFFICE PROGRESS NOTE   Diagnosis: Esophagus cancer  INTERVAL HISTORY:   Alexandria Buck is scheduled to begin pembrolizumab today.  She presented to the emergency room on 09/29/2020 after taking a Tylenol tablet.  She felt the tablet was "stuck ".  She presented to emergency room.  She was taken to an upper endoscopy by Dr. Havery Moros on 09/29/2020.  An esophageal ulcer was found at 26 mm, esophagitis at 24 cm at site of prior dilation.  A benign-appearing stenosis was found at 18 cm.  A superficial mucosal rent was found at the upper esophageal sphincter.  No pill was found.  She was discharged with the plan to perform an outpatient barium swallow.  She reports she is unable to swallow liquids or solids.  She is spitting her saliva.  She continues to have back pain.  She has not been able to eat or drink since 09/29/2020.  She has not been taking her heart medication.  Objective:  Vital signs in last 24 hours:  Blood pressure (!) 154/109, pulse 93, temperature 98 F (36.7 C), temperature source Oral, resp. rate 18, height $RemoveBe'5\' 1"'detofMQoH$  (1.549 m), weight 72 lb 6.4 oz (32.8 kg), SpO2 99 %.    HEENT: No thrush or ulcers, the mucous membranes are moist Resp: Lungs clear bilaterally, decreased breath sounds at the right lower posterior chest, no respiratory distress Cardio: Regular rate and rhythm GI: Tender in the right and mid upper abdomen, no hepatosplenomegaly, no mass Vascular: No leg edema     Lab Results:  Lab Results  Component Value Date   WBC 4.7 09/16/2020   HGB 11.6 (L) 09/22/2020   HCT 38.2 09/16/2020   MCV 92.7 09/14/2020   PLT 247 09/29/2020   NEUTROABS 3.1 09/15/2020    CMP  Lab Results  Component Value Date   NA 142 09/14/2020   K 3.6 10/02/2020   CL 101 10/04/2020   CO2 22 09/22/2020   GLUCOSE 70 09/19/2020   BUN 19 09/28/2020   CREATININE 0.75 09/22/2020   CALCIUM 10.4 (H) 09/16/2020   PROT 9.1 (H) 09/22/2020   ALBUMIN 3.9 09/15/2020    AST 15 09/19/2020   ALT <5 09/19/2020   ALKPHOS 49 09/17/2020   BILITOT 0.6 09/18/2020   GFRNONAA >60 09/23/2020   GFRAA 87 12/31/2019    No results found for: CEA1  Lab Results  Component Value Date   INR 1.1 (H) 01/08/2020    Imaging:  DG Chest Port 1 View  Result Date: 09/29/2020 CLINICAL DATA:  Dysphagia. EXAM: PORTABLE CHEST 1 VIEW COMPARISON:  09/21/2020 FINDINGS: The heart size and mediastinal contours are within normal limits. Both lungs are clear. The visualized skeletal structures are unremarkable. IMPRESSION: No active disease. Electronically Signed   By: Kerby Moors M.D.   On: 09/29/2020 13:01    Medications: I have reviewed the patient's current medications.   Assessment/Plan: Squamous cell carcinoma of the esophagus Endoscopy 01/14/2020-partially obstructing mid esophagus mass, squamous cell carcinoma, PD-L1 CPS 10% PET scan 01/25/2020-hypermetabolic mid esophagus mass, mildly hypermetabolic right axillary lymph nodes Radiation 02/09/2020-03/22/2020 Cycle 1 Taxol/carboplatin 02/10/2020 Cycle 2 Taxol/carboplatin 02/17/2020 Cycle 3 Taxol/carboplatin 02/24/2020 Chemotherapy held 03/02/2020 due to neutropenia Cycle 4 Taxol/carboplatin 03/09/2020 (Taxol dose reduced due to previous neutropenia) Cycle 5 Taxol/carboplatin 03/16/2020 CT chest 06/07/2020-indistinct tissue planes in the mediastinum along the esophagus following radiation therapy.  No mass lesion identified.  No new mediastinal lymphadenopathy.  Stable right hilar lymph node. CT chest/abdomen/pelvis 07/28/2020- focal outpouching of  the lateral thoracic esophagus near the level of the carina corresponding to the patient's previously seen site of malignancy.  Some mild hazy paraesophageal margins and stranding in the posterior mediastinum about the esophagus itself which is nonspecific.  No large or current focal mass lesion seen.  No significant residual paraesophageal adenopathy seen. Upper endoscopy 08/31/2020-  benign-appearing esophageal stenosis just below the UES.  Benign-appearing esophageal stenosis at the region of previous SCC.  This leads to an esophageal pouch/ulceration, ulcerations biopsy.  White nummular lesions in the esophageal mucosa and distal esophagus, biopsied.  Dilatation performed in the entire esophagus.  Pathology on distal esophageal biopsy showed inflamed squamous mucosa with fungus consistent with candidal esophagitis.  No dysplasia or malignancy.  Esophagus biopsy showed a small fragment of squamous cell carcinoma associated with abundant necrosis and exudate. Benign upper esophageal stricture, status post dilation 01/14/2020, 02/08/2020 H. pylori gastritis, treated with medical therapy October 2021 Cardiomyopathy Bipolar disorder Tobacco and alcohol use Hypertension COVID-19 vaccination, booster 04/29/2020 Urinary tract infection 02/16/2020 Odynophagia secondary to radiation-resolved Upper endoscopy 09/21/2020-benign-appearing stenoses at 16 cm and 24 cm-dilated, nonbleeding pouch at 25-28 cm with abnormal mucosa and necrotic tissue/foodstuff Emergency room visit 09/29/2020 with a globus se ulcer at 26 cm nsation following a Tylenol tablet, EGD without a retained pill, esophagitis at 24 cm, benign-appearing stenosis at 18 cm,, superficial mucosal rent in the proximal esophagus at the UES    Disposition: Alexandria Buck has a history of squamous cell carcinoma of the esophagus.  She was confirmed to have microscopic recurrence involving an esophageal pouch/ulceration on endoscopy 08/31/2020.  She was scheduled to begin pembrolizumab today.  She has developed recurrent severe solid/liquid dysphagia.  This could be related to esophagitis, radiation stricturing, or recurrent tumor.  Alexandria Buck has lost a significant amount of weight over the past week and she is not able to tolerate liquids or her medication at present.  She will be admitted for intravenous hydration and further  evaluation.  She will likely require placement of a feeding tube.  We placed pembrolizumab on hold for now.  She also has persistent back pain of unclear etiology.  She is scheduled for an outpatient thoracic MRI.  We will order this while she is in the hospital.  She will receive intravenous fluids and a dose of morphine while waiting on a hospital bed today.  I discussed the case with Dr. Marylyn Ishihara and he agrees to accept her for hospital admission.  Betsy Coder, MD  09/09/2020  11:24 AM

## 2020-10-03 NOTE — Telephone Encounter (Signed)
-----   Message from Alfredia Ferguson, PA-C sent at 10/01/2020  1:23 PM EDT ----- Regarding: Alexandria Buck Patient called today saying that she has had increased coughing and phlegm production after the EGD per Dr. Havery Moros and on 09/29/2020 at which time she was in the ER with a sensation that she had a pill stuck in the esophagus.  She says she is still unable to take anything other than very small bites or sips of liquids and mashed up applesauce or peaches.  Initially she said that she could not even keep water down but on careful questioning she is keeping liquids down and able to take very small amounts of very soft soft pured consistencies.  She is not complaining of any pain, no fever or chills. She was instructed to stay on a full liquid to pured type diet, add boost or Ensure at least 1 daily, and sip throughout the day. Will call back if symptoms worsen and if cough worsens or develops fever proceed to the emergency room.  See there was a note in that she supposed to get scheduled for a barium swallow for further evaluation of her esophagus, she has had a squamous cell cancer of the esophagus for which she has undergone treatment.  Natallie Ravenscroft please get her set up for the barium swallow as soon as possible this week, and I think we should add on a chest x-ray PA and lateral along with the barium swallow since she is complaining of new cough and sputum.  Thank you

## 2020-10-03 NOTE — Patient Instructions (Signed)

## 2020-10-03 NOTE — Telephone Encounter (Signed)
Per 6/27 los F/u TBA

## 2020-10-03 NOTE — Progress Notes (Signed)
Spoke with the pt and she is being admitted for feeding tube placement.

## 2020-10-03 NOTE — Telephone Encounter (Signed)
See alternate phone note 6/27
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
## 2020-10-04 ENCOUNTER — Inpatient Hospital Stay (HOSPITAL_COMMUNITY): Payer: Medicaid Other

## 2020-10-04 DIAGNOSIS — R059 Cough, unspecified: Secondary | ICD-10-CM

## 2020-10-04 DIAGNOSIS — J860 Pyothorax with fistula: Secondary | ICD-10-CM

## 2020-10-04 DIAGNOSIS — C154 Malignant neoplasm of middle third of esophagus: Principal | ICD-10-CM

## 2020-10-04 DIAGNOSIS — Z0000 Encounter for general adult medical examination without abnormal findings: Secondary | ICD-10-CM

## 2020-10-04 DIAGNOSIS — R1310 Dysphagia, unspecified: Secondary | ICD-10-CM | POA: Diagnosis not present

## 2020-10-04 DIAGNOSIS — J690 Pneumonitis due to inhalation of food and vomit: Secondary | ICD-10-CM

## 2020-10-04 DIAGNOSIS — C159 Malignant neoplasm of esophagus, unspecified: Secondary | ICD-10-CM

## 2020-10-04 DIAGNOSIS — M5459 Other low back pain: Secondary | ICD-10-CM

## 2020-10-04 LAB — EXPECTORATED SPUTUM ASSESSMENT W GRAM STAIN, RFLX TO RESP C

## 2020-10-04 LAB — COMPREHENSIVE METABOLIC PANEL
ALT: 9 U/L (ref 0–44)
AST: 17 U/L (ref 15–41)
Albumin: 3.3 g/dL — ABNORMAL LOW (ref 3.5–5.0)
Alkaline Phosphatase: 44 U/L (ref 38–126)
Anion gap: 10 (ref 5–15)
BUN: 13 mg/dL (ref 8–23)
CO2: 26 mmol/L (ref 22–32)
Calcium: 9.1 mg/dL (ref 8.9–10.3)
Chloride: 107 mmol/L (ref 98–111)
Creatinine, Ser: 0.68 mg/dL (ref 0.44–1.00)
GFR, Estimated: >60 mL/min (ref >60)
Glucose, Bld: 109 mg/dL — ABNORMAL HIGH (ref 70–99)
Potassium: 2.7 mmol/L — CL (ref 3.5–5.1)
Sodium: 143 mmol/L (ref 135–145)
Total Bilirubin: 0.9 mg/dL (ref 0.3–1.2)
Total Protein: 8.5 g/dL — ABNORMAL HIGH (ref 6.5–8.1)

## 2020-10-04 LAB — CBC
HCT: 31.9 % — ABNORMAL LOW (ref 36.0–46.0)
Hemoglobin: 10.1 g/dL — ABNORMAL LOW (ref 12.0–15.0)
MCH: 28.3 pg (ref 26.0–34.0)
MCHC: 31.7 g/dL (ref 30.0–36.0)
MCV: 89.4 fL (ref 80.0–100.0)
Platelets: 232 10*3/uL (ref 150–400)
RBC: 3.57 MIL/uL — ABNORMAL LOW (ref 3.87–5.11)
RDW: 13.7 % (ref 11.5–15.5)
WBC: 5 10*3/uL (ref 4.0–10.5)
nRBC: 0 % (ref 0.0–0.2)

## 2020-10-04 LAB — HIV ANTIBODY (ROUTINE TESTING W REFLEX): HIV Screen 4th Generation wRfx: NONREACTIVE

## 2020-10-04 LAB — SARS CORONAVIRUS 2 (TAT 6-24 HRS): SARS Coronavirus 2: NEGATIVE

## 2020-10-04 MED ORDER — IOHEXOL 300 MG/ML  SOLN
75.0000 mL | Freq: Once | INTRAMUSCULAR | Status: AC | PRN
  Administered 2020-10-04: 60 mL via INTRAVENOUS

## 2020-10-04 MED ORDER — DIATRIZOATE MEGLUMINE & SODIUM 66-10 % PO SOLN
30.0000 mL | Freq: Once | ORAL | Status: DC
Start: 2020-10-04 — End: 2020-10-04

## 2020-10-04 MED ORDER — POTASSIUM CHLORIDE 10 MEQ/100ML IV SOLN
10.0000 meq | INTRAVENOUS | Status: AC
  Administered 2020-10-04 (×4): 10 meq via INTRAVENOUS
  Filled 2020-10-04 (×3): qty 100

## 2020-10-04 MED ORDER — DIATRIZOATE MEGLUMINE & SODIUM 66-10 % PO SOLN
ORAL | Status: AC
  Administered 2020-10-04: 10 mL via ORAL
  Filled 2020-10-04: qty 30

## 2020-10-04 MED ORDER — MORPHINE SULFATE (CONCENTRATE) 10 MG/0.5ML PO SOLN: 10.0000 mg | ORAL | Status: DC | PRN

## 2020-10-04 MED ORDER — GADOBUTROL 1 MMOL/ML IV SOLN
4.0000 mL | Freq: Once | INTRAVENOUS | Status: AC | PRN
  Administered 2020-10-04: 4 mL via INTRAVENOUS

## 2020-10-04 MED ORDER — SODIUM CHLORIDE (PF) 0.9 % IJ SOLN
INTRAMUSCULAR | Status: AC
  Filled 2020-10-04: qty 50

## 2020-10-04 MED ORDER — SODIUM CHLORIDE 0.9 % IV SOLN
1.5000 g | Freq: Four times a day (QID) | INTRAVENOUS | Status: DC
  Administered 2020-10-04: 1.5 g via INTRAVENOUS
  Filled 2020-10-04 (×3): qty 4

## 2020-10-04 NOTE — Progress Notes (Signed)
Pharmacy Antibiotic Note  Alexandria Buck is a 60 y.o. female admitted on 10/06/2020 with  aspiration PNA  .  Pharmacy has been consulted for Unasyn dosing.  Plan: Unasyn 1.5 gr IV q6h  Monitor clinical course, renal function, cultures as available  Height: 5\' 1"  (154.9 cm) Weight: 33.5 kg (73 lb 13.7 oz) IBW/kg (Calculated) : 47.8  Temp (24hrs), Avg:98.6 F (37 C), Min:98.1 F (36.7 C), Max:99.1 F (37.3 C)  Recent Labs  Lab 09/29/20 1244 09/24/2020 1016 10/04/20 0511  WBC 7.0 4.7 5.0  CREATININE 1.24* 0.75 0.68    Estimated Creatinine Clearance: 39.1 mL/min (by C-G formula based on SCr of 0.68 mg/dL).    No Known Allergies     Thank you for allowing pharmacy to be a part of this patient's care.   Royetta Asal, PharmD, BCPS 10/04/2020 6:49 PM

## 2020-10-04 NOTE — Progress Notes (Signed)
     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  CTS has been asked to evaluate this patient for placement of a bronchial stent.  Gastroenterology is already on board and would also like to place an esophageal stent.  Unfortunately we do not place bronchial stents at this institution.  At this is to be done that she will need to be transferred to another facility.  Kazden Largo Bary Leriche

## 2020-10-04 NOTE — Progress Notes (Signed)
Pt urinated 175cc amber urine since the bladder scan.  Will re-bladder scan.

## 2020-10-04 NOTE — Progress Notes (Addendum)
[facility]  PROGRESS NOTE  Alexandria Buck JME:[id number] DOB: 1960-11-11 DOA: 09/10/2020 PCP: Ladell Pier, MD   Brief HPI:   60 year old female with medical history of combined CHF, hypertension, squamous cell carcinoma of the esophagus, confirmed to have microscopic recurrence of cancer on endoscopy from 08/31/2020.  She presented with dysphagia to solids and liquids.  Gastroenterology was consulted.  Esophagram/barium swallow showed esophageal bronchial fistula with high flow of water-soluble contrast into the left mainstem bronchus.    Subjective   Patient seen and examined, denies any complaints.   Assessment/Plan:     Bronchial esophageal fistula -Confirmed on Gastrografin esophagram -CT chest shows communication between subcarinal esophagus and left mainstem bronchus -Both CT surgery and pulmonology were consulted -Bronchial stents are not placed in this hospital; CT surgery recommended transfer to another facility, however pulmonology feels that patient chance of having successful bronchial stent placement and healing without migration, infection and seeding is very low especially in setting of prior radiation therapy. -Might need esophageal stent; plan as per GI -Palliative care consulted for goals of care  Squamous cell carcinoma of the esophagus -Oncology following -Plan for outpatient pembrolizumab, if patient is stable  Chronic back pain -MRI thoracic spine ordered -Follow results  Hypokalemia -Potassium is 2.7 -We will give IV KCl 10 mEq x 4 -Follow potassium level in a.m.  Severe protein calorie malnutrition -Secondary to above -May need PEG tube placement for nutrition -Palliative care consulted  Chronic combined CHF -Currently euvolemic  Normocytic anemia -Hemoglobin stable at 10.1  Hypertension -Continue as needed hydralazine  Scheduled medications:    chlorhexidine  15 mL Mouth Rinse BID   mouth rinse  15 mL Mouth Rinse  [id number]   pantoprazole (PROTONIX) IV  40 mg Intravenous Q24H   sodium chloride (PF)             Data Reviewed:   CBG:  No results for input(s): GLUCAP in the last 168 hours.  SpO2: 100 %    Vitals:   09/17/2020 2146 10/04/20 0224 10/04/20 0558 10/04/20 1432  BP: (!) 153/80 (!) 147/87 122/77 (!) 152/109  Pulse: (!) 107 81 85 91  Resp: 16 16 16    Temp: 99 F (37.2 C) 98.1 F (36.7 C) 98.2 F (36.8 C) 99.1 F (37.3 C)  TempSrc: Oral Oral Oral Oral  SpO2: 98% 99% 99% 100%  Weight:      Height:         Intake/Output Summary (Last 24 hours) at 10/04/2020 1803 Last data filed at 10/04/2020 1012 Gross per 24 hour  Intake 1139.61 ml  Output 175 ml  Net 964.61 ml    06/26 1901 - 06/28 0700 In: 1148.8 [I.V.:1121.2] Out: -   Filed Weights   09/19/2020 1427  Weight: 33.5 kg    CBC:  Recent Labs  Lab 09/29/20 1244 09/21/2020 1016 10/04/20 0511  WBC 7.0 4.7 5.0  HGB 13.8 11.6* 10.1*  HCT 43.7 38.2 31.9*  PLT 336 247 232  MCV 89.0 92.7 89.4  MCH 28.1 28.2 28.3  MCHC 31.6 30.4 31.7  RDW 13.4 13.7 13.7  LYMPHSABS 1.2 1.0  --   MONOABS 0.7 0.5  --   EOSABS 0.0 0.1  --   BASOSABS 0.0 0.0  --     Complete metabolic panel:  Recent Labs  Lab 09/29/20 1244 09/25/2020 1016 10/04/20 0511  NA 135 142 143  K 3.3* 3.6 2.7*  CL 98 101 107  CO2 25 22 26   GLUCOSE 96  70 109*  BUN 20 19 13   CREATININE 1.24* 0.75 0.68  CALCIUM 9.5 10.4* 9.1  AST 24 15 17   ALT 10 <5 9  ALKPHOS 60 49 44  BILITOT 0.4 0.6 0.9  ALBUMIN 4.1 3.9 3.3*    No results for input(s): LIPASE, AMYLASE in the last 168 hours.  Recent Labs  Lab 09/29/20 1208 09/09/2020 1556  SARSCOV2NAA NEGATIVE NEGATIVE    ------------------------------------------------------------------------------------------------------------------ No results for input(s): CHOL, HDL, LDLCALC, TRIG, CHOLHDL, LDLDIRECT in the last 72 hours.  No results found for:  HGBA1C ------------------------------------------------------------------------------------------------------------------ No results for input(s): TSH, T4TOTAL, T3FREE, THYROIDAB in the last 72 hours.  Invalid input(s): FREET3 ------------------------------------------------------------------------------------------------------------------ No results for input(s): VITAMINB12, FOLATE, FERRITIN, TIBC, IRON, RETICCTPCT in the last 72 hours.  Coagulation profile No results for input(s): INR, PROTIME in the last 168 hours. No results for input(s): DDIMER in the last 72 hours.  Cardiac Enzymes No results for input(s): CKTOTAL, CKMB, CKMBINDEX, TROPONINI in the last 168 hours.  ------------------------------------------------------------------------------------------------------------------    Component Value Date/Time   BNP 98.5 07/01/2020 0755     Antibiotics: Anti-infectives (From admission, onward)    None        Radiology Reports  CT CHEST W CONTRAST  Result Date: 10/04/2020 CLINICAL DATA:  Esophageal/gastric carcinoma. Esophageal squamous cell carcinoma post chemo radiation therapy. Multiple dilatations in esophagitis. Concern for esophageal bronchial fistula. EXAM: CT CHEST WITH CONTRAST TECHNIQUE: Multidetector CT imaging of the chest was performed during intravenous contrast administration. CONTRAST:  110mL OMNIPAQUE IOHEXOL 300 MG/ML  SOLN COMPARISON:  Esophagram same day FINDINGS: Cardiovascular: No significant vascular findings. Normal heart size. No pericardial effusion. Mediastinum/Nodes: No axillary or supraclavicular adenopathy. No mediastinal or hilar adenopathy. No pericardial fluid. Esophagus normal. Lungs/Pleura: Paraseptal emphysema the upper lobes. No aspiration pneumonitis evident in the lower lobe. Several foci ground-glass nodular density in the lingula segmental pattern (image 56 through 81 of series 5). Mild atelectasis in the LEFT lower lobe. There is a rent in  the mid esophagus several cm below the carina. There is a clear communication between the LEFT mainstem bronchus and the esophagus with a 3 mm rent on image 63/axial series 5. Mild esophageal mucosal thickening. No evidence of mediastinal fluid collections. LEFT upper lobe pulmonary nodule measuring 2 mm (image 27/5) is new from prior. Upper Abdomen: Limited view of the liver, kidneys, pancreas are unremarkable. Normal adrenal glands. Musculoskeletal: No aggressive osseous lesion IMPRESSION: 1. Clear communication demonstrated between the subcarinal esophagus and the LEFT mainstem bronchus with a 3 mm rent in the esophageal wall. This corresponds to esophageal bronchial fistula seen on esophagram same day. 2. Mild ground-glass nodular pattern in the lingula is most favored mild pneumonitis/pulmonary infection. No lower lobe pneumonia or pneumonitis. 3. Mild inflammation diffusely within the esophagus. No evidence of mediastinum infection. 4. Small solid nodule in the LEFT upper lobe is not seen on prior. Recommend attention on follow-up. Electronically Signed   By: Suzy Bouchard M.D.   On: 10/04/2020 09:56   MR THORACIC SPINE W WO CONTRAST  Result Date: 10/04/2020 CLINICAL DATA:  Provided history: Mid back pain. Additional history provided by scanning technologist: Patient reports severe mid back pain. Esophageal cancer. Evaluate for bony metastases. EXAM: MRI THORACIC WITHOUT AND WITH CONTRAST TECHNIQUE: Multiplanar and multiecho pulse sequences of the thoracic spine were obtained without and with intravenous contrast. CONTRAST:  52mL GADAVIST GADOBUTROL 1 MMOL/ML IV SOLN COMPARISON:  CT chest 10/04/2020. FINDINGS: Alignment:  No significant spondylolisthesis. Vertebrae: No vertebral compression fracture. No  suspicious osseous lesion. Multilevel degenerative endplate irregularity with small Schmorl nodes. Cord: No spinal cord signal abnormality or abnormal cord enhancement at the thoracic levels. Paraspinal  and other soft tissues: Paraspinal soft tissues within normal limits. Please refer to the same-day contrast-enhanced chest CT for description of soft tissue findings. Disc levels: Mild-to-moderate disc degeneration throughout the thoracic spine. Small multilevel disc bulges. No focal disc herniation identified within the thoracic spine. Mild lower thoracic facet arthrosis. No significant spinal canal or foraminal stenosis. Lower cervical spondylosis is incompletely assessed on sagittal imaging. Notably at C6-C7, there is a disc bulge with bilateral disc osteophyte ridge/uncinate hypertrophy as well as facet and ligamentum flavum hypertrophy. Apparent severe spinal canal stenosis with spinal cord impingement at C6-C7. Neural foraminal narrowing is incompletely assessed. Impression #3 will be called to the ordering clinician or representative by the Radiologist Assistant, and communication documented in the PACS or [employer]. IMPRESSION: No evidence of osseous metastatic disease to the thoracic spine. Thoracic spondylosis, as described. No significant thoracic spinal canal or foraminal stenosis. Lower cervical spondylosis is incompletely assessed on sagittal imaging. However, there is apparent multifactorial severe spinal canal stenosis with spinal cord impingement at C6-C7. A dedicated cervical spine MRI is recommended for further evaluation. Electronically Signed   By: Kellie Simmering DO   On: 10/04/2020 13:26   DG ESOPHAGUS W SINGLE CM (SOL OR THIN BA)  Result Date: 10/04/2020 CLINICAL DATA:  Esophageal cancer. Multiple esophageal dilatations. Concern for esophageal rent and fistula. EXAM: ESOPHOGRAM/BARIUM SWALLOW TECHNIQUE: Single contrast examination was performed using  water-soluble. FLUOROSCOPY TIME:  Fluoroscopy Time:  6 Radiation Exposure Index (if provided by the fluoroscopic device): 9.5 mGy Number of Acquired Spot Images: 6 COMPARISON:  None. FINDINGS: Water-soluble contrast was administered by  mouth. With only one sip of contrast, a communication is immediately demonstrated with high flow of contrast into the LEFT mainstem bronchus. Exam was halted. Patient experienced multiple coughing episodes following bronchial aspiration. Potential diverticulum noted along the LEFT proximal esophagus. IMPRESSION: 1. Esophageal bronchial fistula with high flow of water soluble contrast into the LEFT mainstem bronchus with one sip of oral contrast. 2. Potential proximal LEFT esophageal diverticulum. Findings conveyed toGunter, PA, GIon 10/04/2020  at09:44. Electronically Signed   By: Suzy Bouchard M.D.   On: 10/04/2020 09:46      DVT prophylaxis: SCDs  Code Status: Full code  Family Communication: Discussed with patient's daughter at bedside   Consultants: Gastroenterology  Procedures:     Objective    Physical Examination:  General-appears in no acute distress Heart-S1-S2, regular, no murmur auscultated Lungs-clear to auscultation bilaterally, no wheezing or crackles auscultated Abdomen-soft, nontender, no organomegaly Extremities-no edema in the lower extremities Neuro-alert, oriented x3, no focal deficit noted  Status is: Inpatient  Dispo: The patient is from: Home              Anticipated d/c is to: Home              Anticipated d/c date is: 10/06/2020              Patient currently not stable for discharge  Barrier to discharge-ongoing treatment for bronchoesophageal fistula  COVID-19 Labs  No results for input(s): DDIMER, FERRITIN, LDH, CRP in the last 72 hours.  Lab Results  Component Value Date   [place] NEGATIVE 09/16/2020   [place] NEGATIVE 09/29/2020   [place] NEGATIVE 08/26/2020   [place] NEGATIVE 08/04/2020    Microbiology  Recent Results (from the past 240 hour(s))  Resp Panel by RT-PCR (Flu A&B, Covid) Nasopharyngeal Swab     Status: None   Collection Time: 09/29/20 12:08 PM   Specimen: Nasopharyngeal Swab; Nasopharyngeal(NP) swabs  in vial transport medium  Result Value Ref Range Status   SARS Coronavirus 2 by RT PCR NEGATIVE NEGATIVE Final    Comment: (NOTE) SARS-CoV-2 target nucleic acids are NOT DETECTED.  The SARS-CoV-2 RNA is generally detectable in upper respiratory specimens during the acute phase of infection. The lowest concentration of SARS-CoV-2 viral copies this assay can detect is 138 copies/mL. A negative result does not preclude SARS-Cov-2 infection and should not be used as the sole basis for treatment or other patient management decisions. A negative result may occur with  improper specimen collection/handling, submission of specimen other than nasopharyngeal swab, presence of viral mutation(s) within the areas targeted by this assay, and inadequate number of viral copies(<138 copies/mL). A negative result must be combined with clinical observations, patient history, and epidemiological information. The expected result is Negative.  Fact Sheet for Patients:  EntrepreneurPulse.com.au  Fact Sheet for Healthcare Providers:  IncredibleEmployment.be  This test is no t yet approved or cleared by the [place] FDA and  has been authorized for detection and/or diagnosis of SARS-CoV-2 by FDA under an Emergency Use Authorization (EUA). This EUA will remain  in effect (meaning this test can be used) for the duration of the COVID-19 declaration under Section 564(b)(1) of the Act, 21 U.S.C.section 360bbb-3(b)(1), unless the authorization is terminated  or revoked sooner.       Influenza A by PCR NEGATIVE NEGATIVE Final   Influenza B by PCR NEGATIVE NEGATIVE Final    Comment: (NOTE) The Xpert Xpress SARS-CoV-2/FLU/RSV plus assay is intended as an aid in the diagnosis of influenza from Nasopharyngeal swab specimens and should not be used as a sole basis for treatment. Nasal washings and aspirates are unacceptable for Xpert Xpress  SARS-CoV-2/FLU/RSV testing.  Fact Sheet for Patients: EntrepreneurPulse.com.au  Fact Sheet for Healthcare Providers: IncredibleEmployment.be  This test is not yet approved or cleared by the [place] FDA and has been authorized for detection and/or diagnosis of SARS-CoV-2 by FDA under an Emergency Use Authorization (EUA). This EUA will remain in effect (meaning this test can be used) for the duration of the COVID-19 declaration under Section 564(b)(1) of the Act, 21 U.S.C. section 360bbb-3(b)(1), unless the authorization is terminated or revoked.  Performed at [facility], [place] [address] [place], [place] [address]   SARS CORONAVIRUS 2 (TAT 6-24 HRS) Nasopharyngeal Nasopharyngeal Swab     Status: None   Collection Time: 09/08/2020  3:56 PM   Specimen: Nasopharyngeal Swab  Result Value Ref Range Status   SARS Coronavirus 2 NEGATIVE NEGATIVE Final    Comment: (NOTE) SARS-CoV-2 target nucleic acids are NOT DETECTED.  The SARS-CoV-2 RNA is generally detectable in upper and lower respiratory specimens during the acute phase of infection. Negative results do not preclude SARS-CoV-2 infection, do not rule out co-infections with other pathogens, and should not be used as the sole basis for treatment or other patient management decisions. Negative results must be combined with clinical observations, patient history, and epidemiological information. The expected result is Negative.  Fact Sheet for Patients: SugarRoll.be  Fact Sheet for Healthcare Providers: https://www.woods-mathews.com/  This test is not yet approved or cleared by the [place] FDA and  has been authorized for detection and/or diagnosis of SARS-CoV-2 by FDA under an Emergency Use Authorization (EUA). This EUA will remain  in  effect (meaning this test can be used) for the duration of the COVID-19 declaration under  Se ction 564(b)(1) of the Act, 21 U.S.C. section 360bbb-3(b)(1), unless the authorization is terminated or revoked sooner.  Performed at [facility], [place] [address], [place], [place] [address]         [facility] If 7PM-7AM, please contact night-coverage at [email or web address], Office  [phone]   10/04/2020, 6:03 PM  LOS: 1 day
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
## 2020-10-04 NOTE — Progress Notes (Signed)
Critical lab rec'ed from [place] in Lab. Pt's potassium of 2.7., Notified Gershon Cull, NP. Neomia Dear, RN

## 2020-10-04 NOTE — Progress Notes (Signed)
[facility] Progress Note    Since last GI note: Chest CT and GG esophagram this AM confirm left bronchesophageal fistula. She was made NPO. Continues to cough frequently.   I spoke with her and her daughter in the room today  Objective: Vital signs in last 24 hours: Temp:  [98.1 F (36.7 C)-99 F (37.2 C)] 98.2 F (36.8 C) (06/28 0558) Pulse Rate:  [74-107] 85 (06/28 0558) Resp:  [16-21] 16 (06/28 0558) BP: (122-174)/(77-92) 122/77 (06/28 0558) SpO2:  [98 %-100 %] 99 % (06/28 0558) Weight:  [33.5 kg] 33.5 kg (06/27 1427) Last BM Date: 09/11/2020 General: alert and oriented times 3 Heart: regular rate and rythm Abdomen: soft, non-tender, non-distended, normal bowel sounds   Lab Results: Recent Labs    09/23/2020 1016 10/04/20 0511  WBC 4.7 5.0  HGB 11.6* 10.1*  PLT 247 232  MCV 92.7 89.4   Recent Labs    09/27/2020 1016 10/04/20 0511  NA 142 143  K 3.6 2.7*  CL 101 107  CO2 22 26  GLUCOSE 70 109*  BUN 19 13  CREATININE 0.75 0.68  CALCIUM 10.4* 9.1   Recent Labs    09/08/2020 1016 10/04/20 0511  PROT 9.1* 8.5*  ALBUMIN 3.9 3.3*  AST 15 17  ALT <5 9  ALKPHOS 49 44  BILITOT 0.6 0.9   No results for input(s): INR in the last 72 hours.   Studies/Results: CT CHEST W CONTRAST  Result Date: 10/04/2020 CLINICAL DATA:  Esophageal/gastric carcinoma. Esophageal squamous cell carcinoma post chemo radiation therapy. Multiple dilatations in esophagitis. Concern for esophageal bronchial fistula. EXAM: CT CHEST WITH CONTRAST TECHNIQUE: Multidetector CT imaging of the chest was performed during intravenous contrast administration. CONTRAST:  80mL OMNIPAQUE IOHEXOL 300 MG/ML  SOLN COMPARISON:  Esophagram same day FINDINGS: Cardiovascular: No significant vascular findings. Normal heart size. No pericardial effusion. Mediastinum/Nodes: No axillary or supraclavicular adenopathy. No mediastinal or hilar adenopathy. No pericardial fluid. Esophagus normal. Lungs/Pleura:  Paraseptal emphysema the upper lobes. No aspiration pneumonitis evident in the lower lobe. Several foci ground-glass nodular density in the lingula segmental pattern (image 56 through 81 of series 5). Mild atelectasis in the LEFT lower lobe. There is a rent in the mid esophagus several cm below the carina. There is a clear communication between the LEFT mainstem bronchus and the esophagus with a 3 mm rent on image 63/axial series 5. Mild esophageal mucosal thickening. No evidence of mediastinal fluid collections. LEFT upper lobe pulmonary nodule measuring 2 mm (image 27/5) is new from prior. Upper Abdomen: Limited view of the liver, kidneys, pancreas are unremarkable. Normal adrenal glands. Musculoskeletal: No aggressive osseous lesion IMPRESSION: 1. Clear communication demonstrated between the subcarinal esophagus and the LEFT mainstem bronchus with a 3 mm rent in the esophageal wall. This corresponds to esophageal bronchial fistula seen on esophagram same day. 2. Mild ground-glass nodular pattern in the lingula is most favored mild pneumonitis/pulmonary infection. No lower lobe pneumonia or pneumonitis. 3. Mild inflammation diffusely within the esophagus. No evidence of mediastinum infection. 4. Small solid nodule in the LEFT upper lobe is not seen on prior. Recommend attention on follow-up. Electronically Signed   By: Suzy Bouchard M.D.   On: 10/04/2020 09:56   DG ESOPHAGUS W SINGLE CM (SOL OR THIN BA)  Result Date: 10/04/2020 CLINICAL DATA:  Esophageal cancer. Multiple esophageal dilatations. Concern for esophageal rent and fistula. EXAM: ESOPHOGRAM/BARIUM SWALLOW TECHNIQUE: Single contrast examination was performed using  water-soluble. FLUOROSCOPY TIME:  Fluoroscopy Time:  6 Radiation  Exposure Index (if provided by the fluoroscopic device): 9.5 mGy Number of Acquired Spot Images: 6 COMPARISON:  None. FINDINGS: Water-soluble contrast was administered by mouth. With only one sip of contrast, a  communication is immediately demonstrated with high flow of contrast into the LEFT mainstem bronchus. Exam was halted. Patient experienced multiple coughing episodes following bronchial aspiration. Potential diverticulum noted along the LEFT proximal esophagus. IMPRESSION: 1. Esophageal bronchial fistula with high flow of water soluble contrast into the LEFT mainstem bronchus with one sip of oral contrast. 2. Potential proximal LEFT esophageal diverticulum. Findings conveyed toGunter, PA, GIon 10/04/2020  at09:44. Electronically Signed   By: Suzy Bouchard M.D.   On: 10/04/2020 09:46     Medications: Scheduled Meds:  chlorhexidine  15 mL Mouth Rinse BID   mouth rinse  15 mL Mouth Rinse q12n4p   pantoprazole (PROTONIX) IV  40 mg Intravenous Q24H   sodium chloride (PF)       Continuous Infusions:  dextrose 5 % and 0.9% NaCl 75 mL/hr at 10/04/20 0233   PRN Meds:.hydrALAZINE, morphine injection   Assessment/Plan: 60 y.o. female with left bronchoesophageal fistula (BEF) in setting of mid esophagus squamous cell cancer that is proven to be still present after chemo/XRT  Closure of a malignancy related BEF is mutidisciplinary, probably ideally with both airway AND esophageal stents.  We have asked pulmonary for their opinion on this and I am available to discuss the case with them after they have seen her.   She understands that closure of the BEF is palliative, to improve her cough and also allow her to maintain her nutrition orally. May still need gtube (IR placed) even if we can successfully treat the fistula.   Milus Banister, MD  10/04/2020, 1:10 PM [facility] Pager [phone]

## 2020-10-04 NOTE — Progress Notes (Signed)
Report rec'ed report from [place], Therapist, sports. States will document head to assessment. Pt. Asleep. No distress noted. Bed side suction in place. Will continue to monitor pt. [place]. Neomia Dear, RN

## 2020-10-04 NOTE — Progress Notes (Signed)
   10/04/20 0650  Urine Characteristics  Urinary Interventions Bladder scan (587 ml, Olena Heckle, NP notified, however change in shift in progress. Will notify oncoming MD.)  Urinary output 200 ml for shift, no urinary output documentation on dayshift. Will obtain order for in and out cath, provider change in shift in progress. Will notify MD oncoming for order. Will report to incoming RN. No distress noted at this time. Kjones RN

## 2020-10-04 NOTE — Progress Notes (Signed)
RN note: pt demands morphine 2mg  IV every 2 hours on the dot, do not want to call to notify and make request for pain medication. Educated patient on prn order. Olena Heckle, NP approved administration of Morphine 30 minutes early due to pt reports of severe pain 10/10. Utilized therapeutic communication including active listening to support client. No distress noted at this time. Will continue to monitor pt. Closely. Kjones RN

## 2020-10-04 NOTE — Progress Notes (Addendum)
HEMATOLOGY-ONCOLOGY PROGRESS NOTE  SUBJECTIVE: Alexandria Buck is followed by our office for history of squamous cell carcinoma of the esophagus who was confirmed to have microscopic recurrence involving an esophageal pouch/ulceration on endoscopy performed 08/31/2020.  She was seen in our office yesterday with plans to begin pembrolizumab but given her significant symptoms of recurrent severe solid/liquid dysphagia, she was admitted to the hospital.  The patient has been seen by GI and had an esophagram/barium swallow performed earlier this morning which showed an esophageal bronchial fistula with high flow of water-soluble contrast into the left mainstem bronchus with 1 sip of oral contrast and she also has a potential proximal left esophageal diverticulum.  CT of the chest showed clear communication demonstrated between the subcarinal esophagus and left mainstem bronchus, mild groundglass nodular pattern in the lingula most favored mild pneumonitis/pulmonary infection, mild inflammation diffusely within the esophagus.  The patient also had an MRI of the thoracic spine performed earlier today.  These results are currently pending.  I saw the patient around lunchtime today.  Her family numbers are at the bedside.  She continues to have mid back pain but otherwise reports that she is feeling well.  She continues to have a productive cough and is using suction.  Continues to have dysphagia.  She offers no other complaints today.  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 ([place])    02/10/2020 -  Chemotherapy   The patient had palonosetron (ALOXI) injection 0.25 mg, 0.25 mg, Intravenous,  Once, 1 of 1 cycle Administration: 0.25 mg (02/10/2020), 0.25 mg (02/17/2020), 0.25 mg (02/24/2020), 0.25 mg (03/09/2020), 0.25 mg (03/16/2020) CARBOplatin (PARAPLATIN) 150 mg in sodium chloride 0.9 % 100 mL  chemo infusion, 150 mg (100 % of original dose 147.8 mg), Intravenous,  Once, 1 of 1 cycle Dose modification:   (original dose 147.8 mg, Cycle 1) Administration: 150 mg (02/17/2020), 150 mg (02/24/2020), 150 mg (03/09/2020), 130 mg (03/16/2020) PACLitaxel (TAXOL) 66 mg in sodium chloride 0.9 % 150 mL chemo infusion (</= 59m/m2), 50 mg/m2 = 66 mg, Intravenous,  Once, 1 of 1 cycle Dose modification: 40 mg/m2 (original dose 50 mg/m2, Cycle 1, Reason: Provider Judgment) Administration: 66 mg (02/10/2020), 66 mg (02/17/2020), 66 mg (02/24/2020), 54 mg (03/09/2020), 54 mg (03/16/2020)   for chemotherapy treatment.     09/23/2020 -  Chemotherapy    Patient is on Treatment Plan: ESOPHAGUS CARBOPLATIN/PACLITAXEL WEEKLY X 6 WEEKS WITH XRT     Patient is on Antibody Plan: LUNG PEMBROLIZUMAB Q21D       PHYSICAL EXAMINATION:  Vitals:   10/04/20 0224 10/04/20 0558  BP: (!) 147/87 122/77  Pulse: 81 85  Resp: 16 16  Temp: 98.1 F (36.7 C) 98.2 F (36.8 C)  SpO2: 99% 99%   Filed Weights   09/22/2020 1427  Weight: 33.5 kg    Intake/Output from previous day: 06/27 0701 - 06/28 0700 In: 1148.8 [I.V.:1121.2; IV Piggyback:27.6] Out: -   GENERAL: Thin, no distress SKIN: skin color, texture, turgor are normal, no rashes or significant lesions LUNGS: Decreased breath sounds on the right HEART: regular rate & rhythm and no murmurs and no lower extremity edema ABDOMEN: Positive bowel sounds, soft, nontender NEURO: alert & oriented x 3 with fluent speech, no focal motor/sensory deficits  LABORATORY DATA:  I have reviewed the data as listed CMP Latest Ref Rng & Units 10/04/2020 09/13/2020 09/29/2020  Glucose 70 -  99 mg/dL 109(H) 70 96  BUN 8 - 23 mg/dL '13 19 20  ' Creatinine 0.44 - 1.00 mg/dL 0.68 0.75 1.24(H)  Sodium 135 - 145 mmol/L 143 142 135  Potassium 3.5 - 5.1 mmol/L 2.7(LL) 3.6 3.3(L)  Chloride 98 - 111 mmol/L 107 101 98  CO2 22 - 32 mmol/L '26 22 25  ' Calcium 8.9 - 10.3 mg/dL 9.1 10.4(H) 9.5   Total Protein 6.5 - 8.1 g/dL 8.5(H) 9.1(H) 9.8(H)  Total Bilirubin 0.3 - 1.2 mg/dL 0.9 0.6 0.4  Alkaline Phos 38 - 126 U/L 44 49 60  AST 15 - 41 U/L '17 15 24  ' ALT 0 - 44 U/L 9 <5 10    Lab Results  Component Value Date   WBC 5.0 10/04/2020   HGB 10.1 (L) 10/04/2020   HCT 31.9 (L) 10/04/2020   MCV 89.4 10/04/2020   PLT 232 10/04/2020   NEUTROABS 3.1 09/20/2020    DG Chest 1 View  Result Date: 09/21/2020 CLINICAL DATA:  Dilation. EXAM: DG C-ARM 1-60 MIN; CHEST  1 VIEW FLUOROSCOPY TIME:  Fluoroscopy Time:  21 second Radiation Exposure Index (if provided by the fluoroscopic device): 0.64 mGy Number of Acquired Spot Images: 5 COMPARISON:  None. FINDINGS: Five C-arm fluoroscopic images were obtained intraoperatively and submitted for post operative interpretation. These images demonstrate placement of a wire and endoscope in the expected region of the esophagus on lateral fluoroscopic images. Please see the performing provider's procedural report for further detail. IMPRESSION: Intraoperative fluoroscopy, as detailed above. Electronically Signed   By: Margaretha Sheffield MD   On: 09/21/2020 09:49   DG Thoracic Spine W/Swimmers  Result Date: 09/23/2020 CLINICAL DATA:  Midthoracic pain. History of esophageal carcinoma. No known injury. EXAM: THORACIC SPINE - 3 VIEWS COMPARISON:  Plain films thoracic spine 07/27/2020. FINDINGS: There is no evidence of thoracic spine fracture. Alignment is normal. No other significant bone abnormalities are identified. IMPRESSION: Normal exam. Electronically Signed   By: Inge Rise M.D.   On: 09/23/2020 13:25   DG Chest Port 1 View  Result Date: 09/29/2020 CLINICAL DATA:  Dysphagia. EXAM: PORTABLE CHEST 1 VIEW COMPARISON:  09/21/2020 FINDINGS: The heart size and mediastinal contours are within normal limits. Both lungs are clear. The visualized skeletal structures are unremarkable. IMPRESSION: No active disease. Electronically Signed   By: Kerby Moors M.D.    On: 09/29/2020 13:01   DG C-Arm 1-60 Min  Result Date: 09/21/2020 CLINICAL DATA:  Dilation. EXAM: DG C-ARM 1-60 MIN; CHEST  1 VIEW FLUOROSCOPY TIME:  Fluoroscopy Time:  21 second Radiation Exposure Index (if provided by the fluoroscopic device): 0.64 mGy Number of Acquired Spot Images: 5 COMPARISON:  None. FINDINGS: Five C-arm fluoroscopic images were obtained intraoperatively and submitted for post operative interpretation. These images demonstrate placement of a wire and endoscope in the expected region of the esophagus on lateral fluoroscopic images. Please see the performing provider's procedural report for further detail. IMPRESSION: Intraoperative fluoroscopy, as detailed above. Electronically Signed   By: Margaretha Sheffield MD   On: 09/21/2020 09:49    ASSESSMENT AND PLAN: Squamous cell carcinoma of the esophagus Endoscopy 01/14/2020-partially obstructing mid esophagus mass, squamous cell carcinoma, PD-L1 CPS 10% PET scan 01/25/2020-hypermetabolic mid esophagus mass, mildly hypermetabolic right axillary lymph nodes Radiation 02/09/2020-03/22/2020 Cycle 1 Taxol/carboplatin 02/10/2020 Cycle 2 Taxol/carboplatin 02/17/2020 Cycle 3 Taxol/carboplatin 02/24/2020 Chemotherapy held 03/02/2020 due to neutropenia Cycle 4 Taxol/carboplatin 03/09/2020 (Taxol dose reduced due to previous neutropenia) Cycle 5 Taxol/carboplatin 03/16/2020 CT chest 06/07/2020-indistinct tissue planes in  the mediastinum along the esophagus following radiation therapy.  No mass lesion identified.  No new mediastinal lymphadenopathy.  Stable right hilar lymph node. CT chest/abdomen/pelvis 07/28/2020- focal outpouching of the lateral thoracic esophagus near the level of the carina corresponding to the patient's previously seen site of malignancy.  Some mild hazy paraesophageal margins and stranding in the posterior mediastinum about the esophagus itself which is nonspecific.  No large or current focal mass lesion seen.  No significant  residual paraesophageal adenopathy seen. Upper endoscopy 08/31/2020- benign-appearing esophageal stenosis just below the UES.  Benign-appearing esophageal stenosis at the region of previous SCC.  This leads to an esophageal pouch/ulceration, ulcerations biopsy.  White nummular lesions in the esophageal mucosa and distal esophagus, biopsied.  Dilatation performed in the entire esophagus.  Pathology on distal esophageal biopsy showed inflamed squamous mucosa with fungus consistent with candidal esophagitis.  No dysplasia or malignancy.  Esophagus biopsy showed a small fragment of squamous cell carcinoma associated with abundant necrosis and exudate. Benign upper esophageal stricture, status post dilation 01/14/2020, 02/08/2020 H. pylori gastritis, treated with medical therapy October 2021 Cardiomyopathy Bipolar disorder Tobacco and alcohol use Hypertension COVID-19 vaccination, booster 04/29/2020 Urinary tract infection 02/16/2020 Odynophagia secondary to radiation-resolved Upper endoscopy 09/21/2020-benign-appearing stenoses at 16 cm and 24 cm-dilated, nonbleeding pouch at 25-28 cm with abnormal mucosa and necrotic tissue/foodstuff Emergency room visit 09/29/2020 with a globus se ulcer at 26 cm nsation following a Tylenol tablet, EGD without a retained pill, esophagitis at 24 cm, benign-appearing stenosis at 18 cm,, superficial mucosal rent in the proximal esophagus at the UES 11.  Hospital admission 09/21/2020-dysphagia 12.  Esophageal bronchial fistula  Alexandria Buck has been admitted to the hospital due to worsening dysphagia.  May need tube feed placement this admission.  She had an esophagram/barium swallow performed this morning which demonstrated an esophageal bronchial fistula.  GI is following and planning for procedure tomorrow morning.   She has persistent mid back pain, an MRI of the thoracic spine has been ordered and these results are currently pending.  She has persistent normocytic anemia  which is overall stable.  We will plan to monitor this.  Her hypercalcemia has resolved with hydration.  Recommendations: 1.  Procedure per GI tomorrow morning. 2.  We will follow-up on MRI of the thoracic spine. 3.  We will plan to reschedule her outpatient pembrolizumab following hospital discharge. 4.   Trial of sublingual morphine for pain   LOS: 1 day   Mikey Bussing, DNP, AGPCNP-BC, AOCNP 10/04/20 Alexandria Buck was interviewed and examined.  She has a cough, dysphagia, and persistent back pain.  I discussed the radiologic findings with Dr. Ardis Hughs.  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  She may be a candidate for palliative esophageal and bronchial stents.  Her pain is most likely related related to the fistula and tumor involving the esophagus, but we will proceed with a thoracic MRI to rule out spine metastases.  We will consider proceeding with pembrolizumab therapy as an outpatient if she stabilizes from the acute presentation.  I was present for greater than 50% of today's visit.  I performed medical decision making.

## 2020-10-05 DIAGNOSIS — R1310 Dysphagia, unspecified: Secondary | ICD-10-CM | POA: Diagnosis not present

## 2020-10-05 DIAGNOSIS — C154 Malignant neoplasm of middle third of esophagus: Secondary | ICD-10-CM | POA: Diagnosis not present

## 2020-10-05 MED FILL — Medication: Qty: 1 | Status: AC

## 2020-10-07 LAB — CULTURE, RESPIRATORY W GRAM STAIN: Culture: NORMAL

## 2020-10-07 NOTE — Significant Event (Signed)
Rapid Response Event Note   Reason for Call :  Patient vomiting blood.  Initial Focused Assessment:  By time of arrival, code blue called, patient in full PEA arrest, receiving chest compressions.      Interventions:  Backboard already in place, pads placed while high quality CPR in progress.  Plan of Care:  Deferred to physician running code.   Event Summary:  Patient expired and was pronounced by MD at 0223. MD Notified: 1444 Call Time: 0202 Arrival Time: 0205 End Time: [place], RN

## 2020-10-07 NOTE — Significant Event (Signed)
Pt reported coughing up blood at about 2130, I visualized a small amount of blood in her sputum in her suction tubing as she was using her hand held Yankauer, I used my flashlight to look I her mouth, no blood visible in her oral cavity, I talked with the patient about my concern and to let me know if she coughed up more blood ,and performed a general re-assessment and there were no changes. Ms Cermak was on her phone with her daughter during out conversation so she was able to hear that I was concerned but explained that it wasn't much blood. I informed the charge RN Carmin Richmond directlly. I changed out the yankauer and tubing to best be able to visualize if there was more bloody sputum.  At about 0203 I was charting at a computer in the hallway a few doors down from Ms Tickner's room. I heard her have a very strong coughing fit, as I got up to check on her I saw that she was calling up front, upon entering the room I saw a moderate amount of blood come out of Ms Leeman's mouth when she coughed and asked the NT to get the charge RN and a VS machine, then moments later copious amounts of blood started pouring out of Ms Berrett's mouth, Ms Schimpf lept out of bed and I was immediately at her side, while helping her into bed I realized she no longer seemed conscious, I lifted her body into the bed, she did not fall, and layed her flat, I could not find a pulse and called a Code Blue. I started compressions at 0205.

## 2020-10-07 NOTE — Consult Note (Signed)
Called to bedside for CODE BLUE, 60 year old female with advanced esophageal cancer, known bronchopleural fistula recently developing large-volume hemoptysis and then went unresponsive.  Patient is pulseless, CPR in progress on my arrival.  Epinephrine being given every 3 minutes.  Copious blood in the airway, intubation needed.  See procedural details below, ET tube established.  Patient in PEA arrest for the entirety of the code.  Over 20 minutes of CPR, unfortunately without return of spontaneous circulation.  Suspect severe bleeding event with aspiration leading to cardiac arrest.  Given the amount of downtime, the unfavorable rhythm, efforts were deemed futile, time of death called at 2:23 AM.  .Critical Care  Date/Time: 2020/10/27 2:35 AM Performed by: Maudie Flakes, MD Authorized by: Maudie Flakes, MD   Critical care provider statement:    Critical care time (minutes):  32   Critical care was necessary to treat or prevent imminent or life-threatening deterioration of the following conditions: Cardiac arrest.   Critical care was time spent personally by me on the following activities:  Discussions with consultants, evaluation of patient's response to treatment, examination of patient, ordering and performing treatments and interventions, ordering and review of laboratory studies, ordering and review of radiographic studies, pulse oximetry, re-evaluation of patient's condition, obtaining history from patient or surrogate and review of old charts Procedure Name: Intubation Date/Time: 10-27-2020 2:35 AM Performed by: Maudie Flakes, MD Ventilation: Mask ventilation without difficulty Laryngoscope Size: Glidescope and 3 Grade View: Grade II Tube size: 7.0 mm Number of attempts: 2 Airway Equipment and Method: Stylet Placement Confirmation: ETT inserted through vocal cords under direct vision Secured at: 25 cm Difficulty Due To: Difficulty was anticipated Comments: Copious bleeding in the  airway.  Initial attempt with direct laryngoscopy using MAC 3 blade unable to view glottis due to copious blood.  Further suction performed and with glide scope able to achieve sufficient view for intubation.  No medications required.    CPR  Date/Time: 10/27/20 2:37 AM Performed by: Maudie Flakes, MD Authorized by: Maudie Flakes, MD  CPR Procedure Details:      Amount of time prior to administration of ACLS/BLS (minutes):  2   ACLS/BLS initiated by EMS: No     CPR/ACLS performed in the ED: No     Duration of CPR (minutes):  10   Outcome: Pt declared dead    CPR performed via ACLS guidelines under my direct supervision.  See RN documentation for details including defibrillator use, medications, doses and timing.

## 2020-10-07 NOTE — Significant Event (Signed)
Responded to Code Blue paged over head.  CPR in progress at bedside.  Pt had PEA arrest after coughing up a large amount of bright red blood with clots.  Code was run by Dr. Sedonia Small.  After no ROSC able to be achieved despite prolonged coding, pt pronounced at 0223.  Family notified and are at bedside now.  Most likely cause of death is probably the known esophageal CA which had eroded through the L mainstem bronchus, eroding through a blood vessel causing massive hemoptysis / pulmonary hemorrhage.

## 2020-10-07 NOTE — Progress Notes (Signed)
Chaplain responded to a page for family support.  Chaplain arrived at same time as the family.  2 daughters and nephew present.  They were grieving the loss and calling other family members.  Medical staff connected with the family about final moments.  The two daughters shared stories of reconciliation with their mom, especially over the last year.  Other family arrived and Chaplain offered ministry of presence, reflective listening and prayer.  Family does have funeral home and will coordinate with the nurse.  Chaplain available as needed for support. Chaplain Katherene Ponto, Mdiv.    October 29, 2020 0348  Clinical Encounter Type  Visited With Patient and family together;Health care provider  Visit Type Death  Referral From Nurse  Consult/Referral To Chaplain  Spiritual Encounters  Spiritual Needs Prayer;Emotional;Grief support  Stress Factors  Family Stress Factors Loss

## 2020-10-07 DEATH — deceased

## 2020-10-11 NOTE — Progress Notes (Signed)
Reviewed patient's chart on my return. Have seen that patient passed away during recent hospitalization after finding of bronchoesophageal fistula. I am sorry to review this.

## 2020-10-12 ENCOUNTER — Ambulatory Visit: Payer: Medicaid Other

## 2020-10-15 ENCOUNTER — Ambulatory Visit (HOSPITAL_COMMUNITY): Payer: Medicaid Other

## 2020-10-24 ENCOUNTER — Ambulatory Visit: Payer: Self-pay | Admitting: Oncology

## 2020-10-24 ENCOUNTER — Other Ambulatory Visit: Payer: Self-pay

## 2020-10-24 ENCOUNTER — Ambulatory Visit: Payer: Self-pay

## 2020-11-03 ENCOUNTER — Ambulatory Visit: Payer: Medicaid Other | Admitting: Internal Medicine

## 2020-11-07 ENCOUNTER — Ambulatory Visit (HOSPITAL_COMMUNITY): Admit: 2020-11-07 | Payer: Medicaid Other | Admitting: Gastroenterology

## 2020-11-07 SURGERY — ESOPHAGOGASTRODUODENOSCOPY (EGD) WITH PROPOFOL
Anesthesia: Monitor Anesthesia Care

## 2020-11-07 NOTE — Discharge Summary (Signed)
Death Summary  Alexandria Buck QQI:[id number] DOB: 09-Dec-1960 DOA: 10/09/20  PCP: Ladell Pier, MD   Admit date: 2020/10/09 Date of Death:  10-11-2020  Final Diagnoses:  Active Problems:   Dysphagia    History of present illness:  60 year old female with medical history of combined CHF, hypertension, squamous cell carcinoma of the esophagus, confirmed to have microscopic recurrence of cancer on endoscopy from 08/31/2020.  She presented with dysphagia to solids and liquids.  Gastroenterology was consulted.  Esophagram/barium swallow showed esophageal bronchial fistula with high flow of water-soluble contrast into the left mainstem bronchus.  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 Palliative care was consulted for goals of care   Patient developed massive hemoptysis and underwent PEA arrest. Code blue was called and she received CPR and epinephrine  as per PEA protocol. Patient could not be revived after prolonged effort . Patient expired at 0223.  Time of death 0223  Signed:  Oswald Hillock  [facility] 10/13/2020, 9:17 PM

## 2020-12-20 ENCOUNTER — Ambulatory Visit: Payer: Medicaid Other | Admitting: Cardiology

## 2022-08-30 IMAGING — CT CT ABD-PELV W/ CM
2 of 5 series · 12 of 36 positions shown, 15 images · IV contrast (APPLIED)
Comparison: PET-CT dated 01/25/2020

CLINICAL DATA: Esophageal cancer, diagnosed 1 month ago. Status
post esophageal stretching yesterday. Dysphagia. XRT ongoing.

EXAM:
CT CHEST, ABDOMEN, AND PELVIS WITH CONTRAST
TECHNIQUE: Multidetector CT imaging of the chest, abdomen and pelvis was
performed following the standard protocol during bolus
administration of intravenous contrast.
CONTRAST:  80mL OMNIPAQUE IOHEXOL 300 MG/ML  SOLN

[Series 2: cap with · axial · 0.61mm/px · z∈[+1038,+1528]mm · 9 of 120 slices shown, 12 images]
[im 11/120  mediastinal]
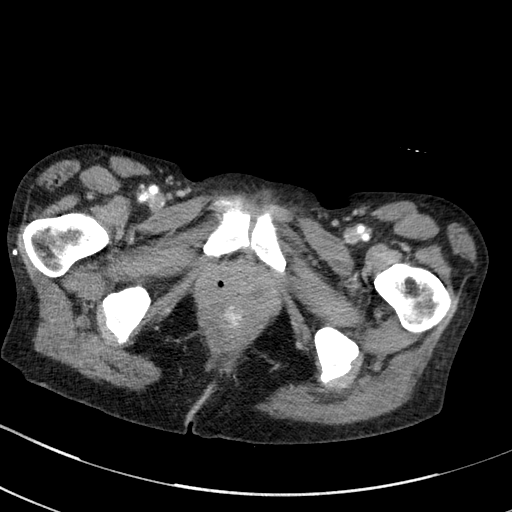
[im 11/120  lung]
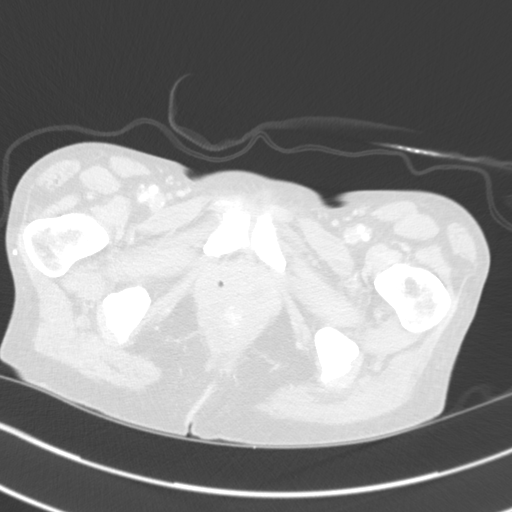
[im 22/120  lung]
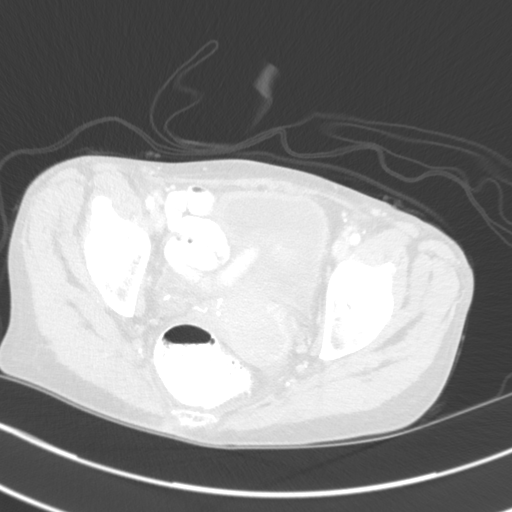
[im 33/120  lung]
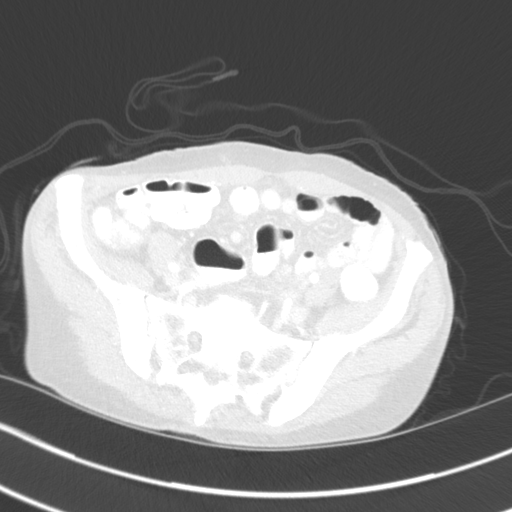
[im 44/120  lung]
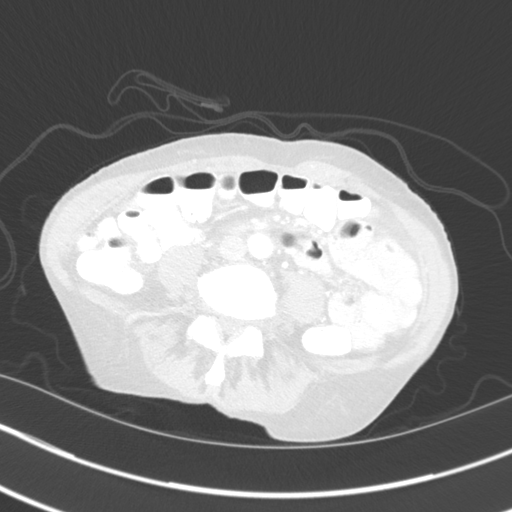
[im 65/120  mediastinal]
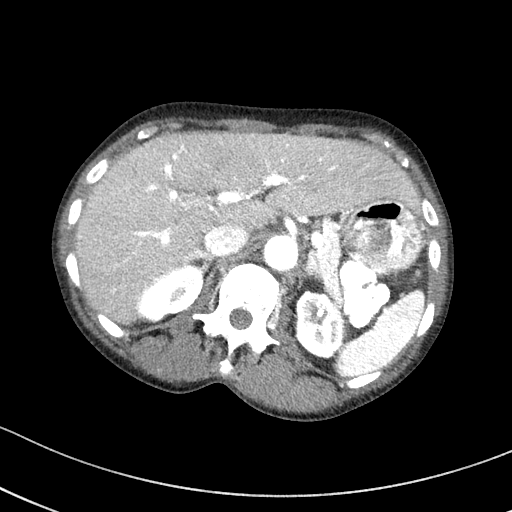
[im 65/120  lung]
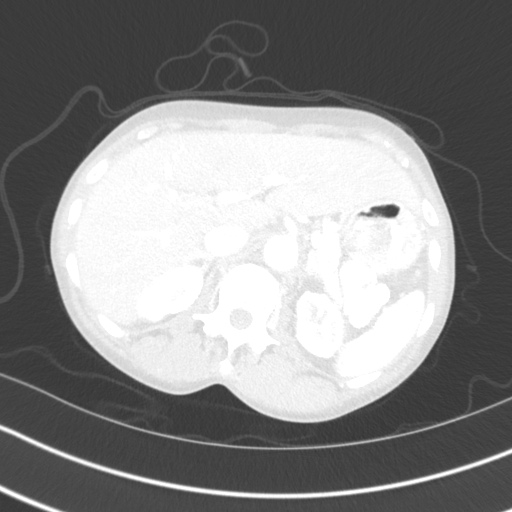
[im 76/120  lung]
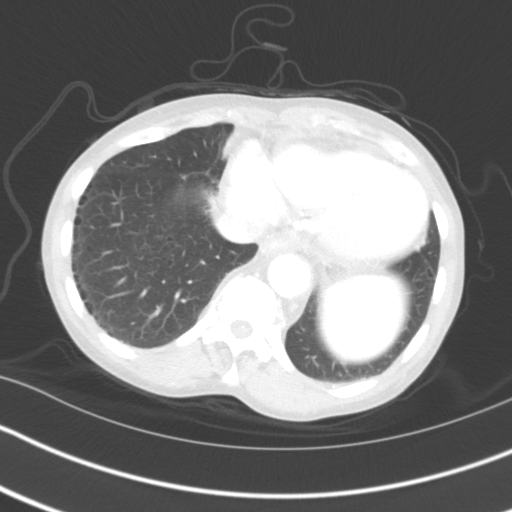
[im 87/120  lung]
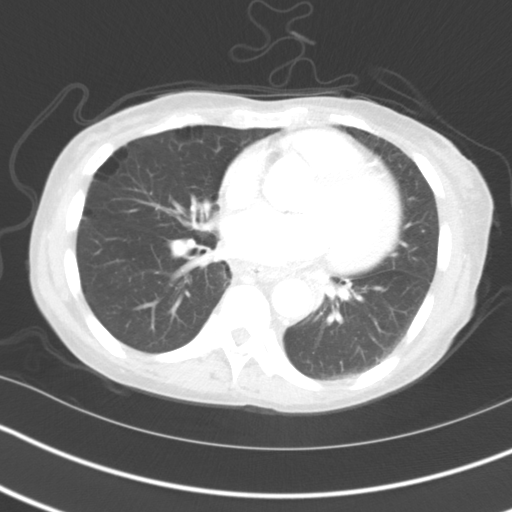
[im 98/120  lung]
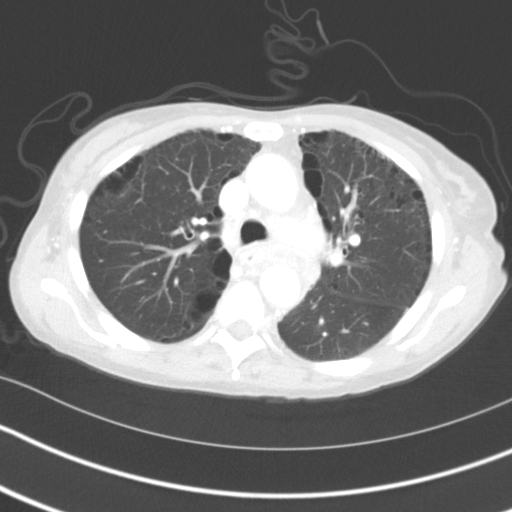
[im 109/120  mediastinal]
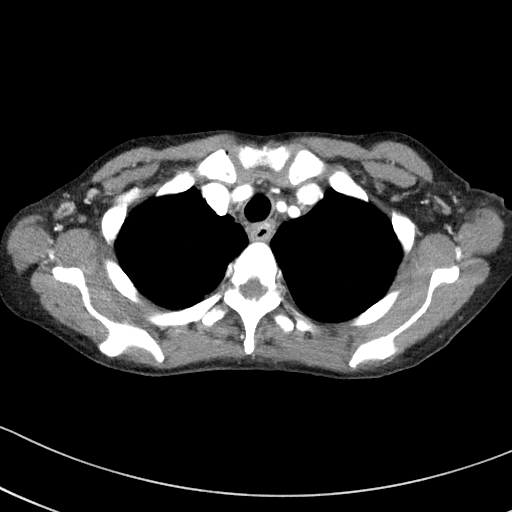
[im 109/120  lung]
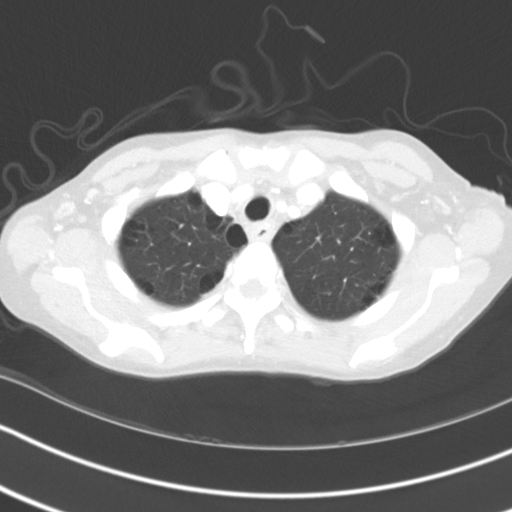

[Series 5: coronals · coronal · 0.62mm/px · 3 of 115 slices shown]
[im 23/115  lung]
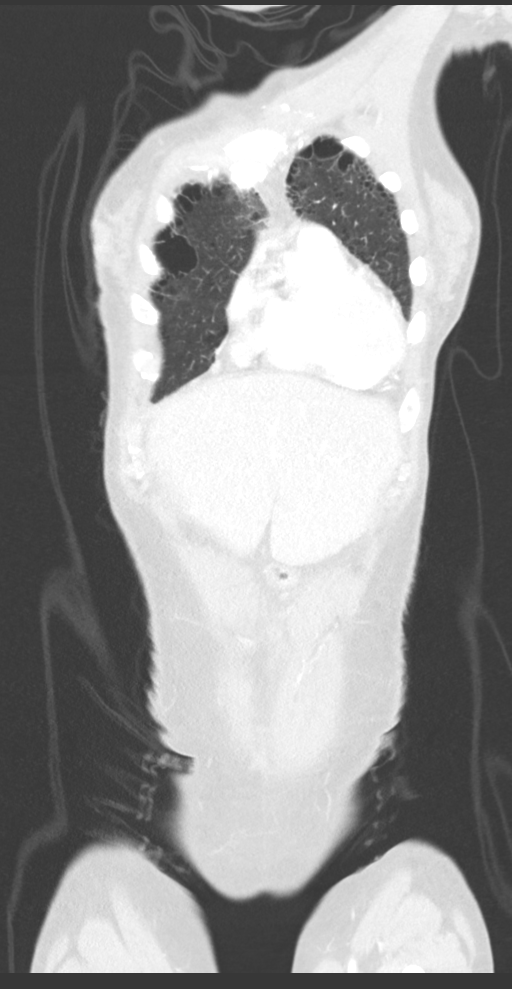
[im 46/115  lung]
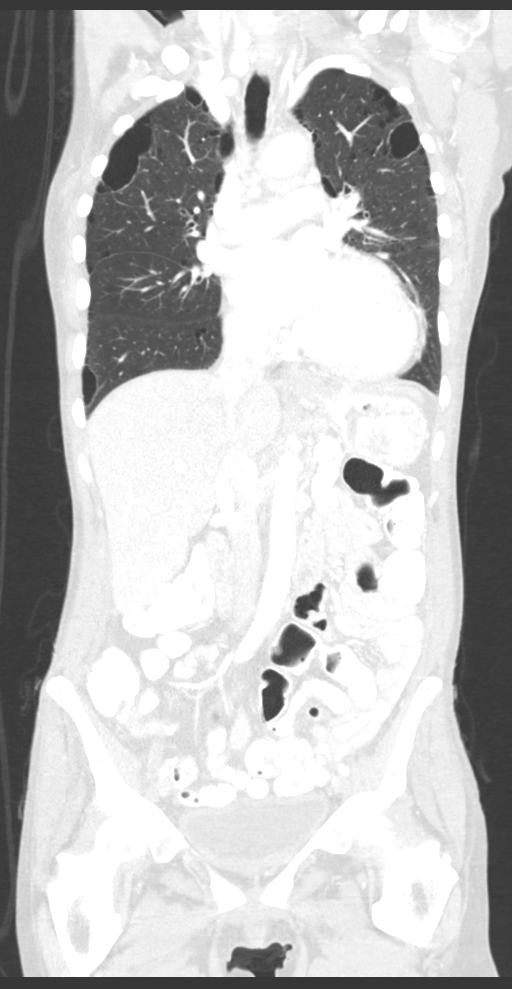
[im 69/115  lung]
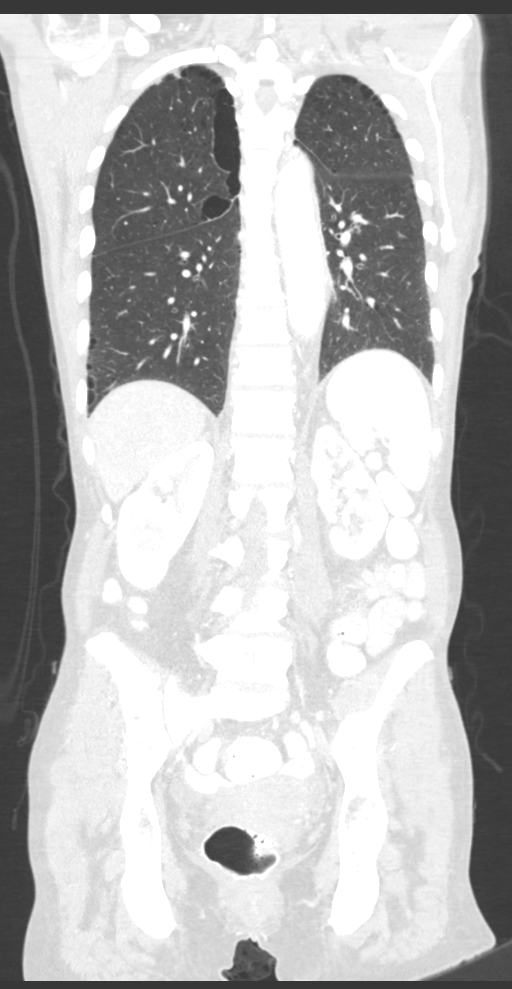

[12 of 36 positions shown; findings below may reference images not displayed]

FINDINGS: CT CHEST FINDINGS

Cardiovascular: The heart is normal in size. No pericardial
effusion.

No evidence of thoracic aortic aneurysm. Mild atherosclerotic
calcifications of the aortic arch.

Mediastinum/Nodes: Wall thickening along the left lateral aspect of
the mid esophagus (series 2/images 24-26), corresponding to the
patient's known primary esophageal neoplasm.

6 mm short axis distal para soft deal node (series 2/image 41),
suspicious. Additional 10 mm short axis azygoesophageal recess node
(series 2/image 31), indeterminate but suspicious. 6 mm short axis
right paratracheal node (series 2/image 17), nonspecific.

Small right axillary nodes measuring up to 7 mm short axis (series
2/image 13), likely reactive.

Lungs/Pleura: No suspicious pulmonary nodules.

Moderate paraseptal emphysematous changes, upper lung predominant.

No focal consolidation.

No pleural effusion or pneumothorax.

Musculoskeletal: Insert os

CT ABDOMEN PELVIS FINDINGS

Hepatobiliary: Liver is within normal limits. No
suspicious/enhancing hepatic lesions.

Gallbladder is underdistended. No intrahepatic or extrahepatic
ductal dilatation.

Pancreas: Within normal limits.

Spleen: Within normal limits.

Adrenals/Urinary Tract: Adrenal glands are within normal limits.

Heterogeneous perfusion of the left kidney, most prominent on
delayed imaging, with mild scarring. To lesser extent, this is also
present in the posterior right upper kidney (series 7/image 13).
This appearance is nonspecific and could reflect pyelonephritis in
the appropriate clinical setting.

Thick-walled bladder, although underdistended.

Stomach/Bowel: Stomach is within normal limits.

No evidence of bowel obstruction.

Normal appendix (series 2/image 93).

Vascular/Lymphatic: No evidence of abdominal aortic aneurysm.

Atherosclerotic calcifications of the abdominal aorta and branch
vessels.

No suspicious abdominopelvic lymphadenopathy.

Reproductive: Uterus and bilateral ovaries are within normal limits.

Other: No abdominopelvic ascites.

Musculoskeletal: Mild degenerative changes of the lumbar spine.
IMPRESSION: Wall thickening along the left lateral aspect of the mid esophagus,
corresponding to the patient's known primary esophageal neoplasm.

Small lower mediastinal nodes, suspicious for nodal metastases.

No evidence of metastatic disease in the abdomen/pelvis.

Heterogeneous enhancement of the bilateral kidneys, left greater
than right, nonspecific but possibly reflecting pyelonephritis in
the appropriate clinical setting. Associated mildly thick-walled
bladder could reflect cystitis.
